# Patient Record
Sex: Male | Born: 1955 | Hispanic: No | Marital: Single | Smoking: Never smoker
Health system: Southern US, Community
[De-identification: ages and names within clinical notes are randomized; demographics above are authoritative.]

## PROBLEM LIST (undated history)

## (undated) DIAGNOSIS — A4151 Sepsis due to Escherichia coli [E. coli]: Secondary | ICD-10-CM

## (undated) DIAGNOSIS — M4807 Spinal stenosis, lumbosacral region: Secondary | ICD-10-CM

## (undated) DIAGNOSIS — E669 Obesity, unspecified: Secondary | ICD-10-CM

## (undated) DIAGNOSIS — I519 Heart disease, unspecified: Secondary | ICD-10-CM

## (undated) DIAGNOSIS — K4090 Unilateral inguinal hernia, without obstruction or gangrene, not specified as recurrent: Secondary | ICD-10-CM

## (undated) DIAGNOSIS — R1031 Right lower quadrant pain: Secondary | ICD-10-CM

## (undated) DIAGNOSIS — E119 Type 2 diabetes mellitus without complications: Secondary | ICD-10-CM

## (undated) DIAGNOSIS — N1330 Unspecified hydronephrosis: Secondary | ICD-10-CM

## (undated) DIAGNOSIS — Z87442 Personal history of urinary calculi: Secondary | ICD-10-CM

## (undated) DIAGNOSIS — I10 Essential (primary) hypertension: Secondary | ICD-10-CM

## (undated) DIAGNOSIS — Z8546 Personal history of malignant neoplasm of prostate: Secondary | ICD-10-CM

## (undated) DIAGNOSIS — N529 Male erectile dysfunction, unspecified: Secondary | ICD-10-CM

## (undated) DIAGNOSIS — I517 Cardiomegaly: Secondary | ICD-10-CM

## (undated) DIAGNOSIS — N200 Calculus of kidney: Secondary | ICD-10-CM

## (undated) HISTORY — DX: Male erectile dysfunction, unspecified: N52.9

## (undated) HISTORY — DX: Calculus of kidney: N20.0

## (undated) HISTORY — PX: TRANSTHORACIC ECHOCARDIOGRAM: SHX275

## (undated) HISTORY — PX: OTHER SURGICAL HISTORY: SHX169

## (undated) HISTORY — DX: Essential (primary) hypertension: I10

---

## 2004-12-16 ENCOUNTER — Emergency Department (HOSPITAL_COMMUNITY): Admission: EM | Admit: 2004-12-16 | Discharge: 2004-12-16 | Payer: Self-pay | Admitting: Emergency Medicine

## 2010-11-08 ENCOUNTER — Other Ambulatory Visit (HOSPITAL_COMMUNITY): Payer: Self-pay | Admitting: Orthopedic Surgery

## 2010-11-08 ENCOUNTER — Ambulatory Visit (HOSPITAL_COMMUNITY)
Admission: RE | Admit: 2010-11-08 | Discharge: 2010-11-08 | Disposition: A | Discharge status: Home / Self Care | Payer: Worker's Compensation | Source: Ambulatory Visit | Attending: Orthopedic Surgery | Admitting: Orthopedic Surgery

## 2010-11-08 DIAGNOSIS — Z0389 Encounter for observation for other suspected diseases and conditions ruled out: Secondary | ICD-10-CM | POA: Insufficient documentation

## 2010-11-08 DIAGNOSIS — Z1389 Encounter for screening for other disorder: Secondary | ICD-10-CM

## 2011-03-29 ENCOUNTER — Inpatient Hospital Stay (HOSPITAL_COMMUNITY)
Admission: EM | Admit: 2011-03-29 | Discharge: 2011-03-31 | DRG: 552 | Disposition: A | Discharge status: Home / Self Care | Payer: Worker's Compensation | Attending: Orthopedic Surgery | Admitting: Orthopedic Surgery

## 2011-03-29 ENCOUNTER — Emergency Department (HOSPITAL_COMMUNITY): Payer: Worker's Compensation

## 2011-03-29 DIAGNOSIS — E669 Obesity, unspecified: Secondary | ICD-10-CM | POA: Diagnosis present

## 2011-03-29 DIAGNOSIS — I10 Essential (primary) hypertension: Secondary | ICD-10-CM | POA: Diagnosis present

## 2011-03-29 DIAGNOSIS — M5126 Other intervertebral disc displacement, lumbar region: Principal | ICD-10-CM | POA: Diagnosis present

## 2011-03-29 LAB — BASIC METABOLIC PANEL
Calcium: 10.3 mg/dL (ref 8.4–10.5)
Chloride: 105 mEq/L (ref 96–112)
GFR calc Af Amer: >60 mL/min (ref >60)
GFR calc non Af Amer: >60 mL/min (ref >60)

## 2011-03-29 LAB — APTT: aPTT: 30 seconds (ref 24–37)

## 2011-03-29 LAB — DIFFERENTIAL
Basophils Relative: 1 % (ref 0–1)
Lymphocytes Relative: 39 % (ref 12–46)
Lymphs Abs: 2.4 10*3/uL (ref 0.7–4.0)
Monocytes Absolute: 0.7 10*3/uL (ref 0.1–1.0)
Monocytes Relative: 11 % (ref 3–12)
Neutro Abs: 2.9 10*3/uL (ref 1.7–7.7)
Neutrophils Relative %: 49 % (ref 43–77)

## 2011-03-29 LAB — CBC
HCT: 40.9 % (ref 39.0–52.0)
Hemoglobin: 14.4 g/dL (ref 13.0–17.0)
MCH: 30.9 pg (ref 26.0–34.0)
MCHC: 35.2 g/dL (ref 30.0–36.0)
MCV: 87.8 fL (ref 78.0–100.0)
Platelets: 252 10*3/uL (ref 150–400)
RBC: 4.66 MIL/uL (ref 4.22–5.81)
RDW: 13.6 % (ref 11.5–15.5)

## 2011-03-29 LAB — URINALYSIS, ROUTINE W REFLEX MICROSCOPIC
Glucose, UA: 100 mg/dL — AB
Ketones, ur: NEGATIVE mg/dL
Nitrite: NEGATIVE
Protein, ur: NEGATIVE mg/dL
pH: 6 (ref 5.0–8.0)

## 2011-03-29 LAB — URINE MICROSCOPIC-ADD ON

## 2011-03-29 LAB — OCCULT BLOOD, POC DEVICE: Fecal Occult Bld: POSITIVE

## 2011-03-30 ENCOUNTER — Inpatient Hospital Stay (HOSPITAL_COMMUNITY): Payer: Worker's Compensation

## 2011-03-30 DIAGNOSIS — M4807 Spinal stenosis, lumbosacral region: Secondary | ICD-10-CM

## 2011-03-30 HISTORY — DX: Spinal stenosis, lumbosacral region: M48.07

## 2011-03-30 LAB — RAPID URINE DRUG SCREEN, HOSP PERFORMED
Amphetamines: NOT DETECTED
Benzodiazepines: NOT DETECTED
Tetrahydrocannabinol: NOT DETECTED

## 2011-03-30 MED ORDER — GADOBENATE DIMEGLUMINE 529 MG/ML IV SOLN
20.0000 mL | Freq: Once | INTRAVENOUS | Status: AC
  Administered 2011-03-30: 20 mL via INTRAVENOUS
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
## 2013-09-15 ENCOUNTER — Encounter: Payer: Self-pay | Admitting: Cardiology

## 2013-09-15 ENCOUNTER — Ambulatory Visit (INDEPENDENT_AMBULATORY_CARE_PROVIDER_SITE_OTHER): Payer: BC Managed Care – PPO | Admitting: Cardiology

## 2013-09-15 VITALS — BP 142/110 | HR 74 | Ht 71.0 in | Wt 241.6 lb

## 2013-09-15 DIAGNOSIS — R9431 Abnormal electrocardiogram [ECG] [EKG]: Secondary | ICD-10-CM

## 2013-09-15 DIAGNOSIS — E782 Mixed hyperlipidemia: Secondary | ICD-10-CM

## 2013-09-15 DIAGNOSIS — I10 Essential (primary) hypertension: Secondary | ICD-10-CM

## 2013-09-15 DIAGNOSIS — E669 Obesity, unspecified: Secondary | ICD-10-CM

## 2013-09-15 DIAGNOSIS — Z79899 Other long term (current) drug therapy: Secondary | ICD-10-CM

## 2013-09-15 DIAGNOSIS — I517 Cardiomegaly: Secondary | ICD-10-CM

## 2013-09-15 MED ORDER — CHLORTHALIDONE 25 MG PO TABS: 25.0000 mg | ORAL_TABLET | Freq: Every day | ORAL | Status: DC

## 2013-09-15 NOTE — Patient Instructions (Addendum)
START Chlorthalidone 25mg  daily.  Dr. Ellyn Hack recommends that you return for lab work in: at your [place] after midnight.  Dr. Ellyn Hack has requested that you have an echocardiogram. Echocardiography is a painless test that uses sound waves to create images of your heart. It provides your doctor with information about the size and shape of your heart and how well your heart's chambers and valves are working. This procedure takes approximately one hour. There are no restrictions for this procedure.   Your physician recommends that you schedule a follow-up appointment in: 3 months.

## 2013-09-17 ENCOUNTER — Encounter: Payer: Self-pay | Admitting: Cardiology

## 2013-09-17 DIAGNOSIS — I517 Cardiomegaly: Secondary | ICD-10-CM | POA: Insufficient documentation

## 2013-09-17 DIAGNOSIS — I10 Essential (primary) hypertension: Secondary | ICD-10-CM | POA: Insufficient documentation

## 2013-09-17 DIAGNOSIS — R9431 Abnormal electrocardiogram [ECG] [EKG]: Secondary | ICD-10-CM | POA: Insufficient documentation

## 2013-09-17 DIAGNOSIS — E669 Obesity, unspecified: Secondary | ICD-10-CM | POA: Insufficient documentation

## 2013-09-17 NOTE — Assessment & Plan Note (Deleted)
Check 2-D echocardiogram to assess for LVH

## 2013-09-17 NOTE — Assessment & Plan Note (Signed)
Discussed dietary modifications. I explained him that weight loss will actually help blood pressure control as well as cardiovascular risk.

## 2013-09-17 NOTE — Assessment & Plan Note (Signed)
Check 2-D echocardiogram to confirm or deny.

## 2013-09-17 NOTE — Progress Notes (Signed)
PATIENT: Derrick Walker MRN: [id number] DOB: Feb 09, 1956 PCP: No PCP Per Patient  Clinic Note: Chief Complaint  Patient presents with  . New Evaluation    Self referred:  has noticed BP has been fluctuating since 2012 up to 170/120.  No chest pain, SOB, edema.  Occas. dizziness when working out.  Dr. Janice Norrie recently prescribed Avodart but he decided not to take due to side effects he read about.   HPI: Derrick Walker is a 58 y.o. male with a PMH below who presents today for initial evaluation for hypertension and cardiovascular risk.  Interval History: He is an otherwise healthy middle-aged gentleman with no real cardiovascular risk besides being a man of 12 was mildly obese. He has had hypertension in the past, and it was trying to use lifestyle modification for treatment. He maintains that he is very active gentleman likes to exercise 5 days a week for about 30 minutes at a time. 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 From cardiovascular standpoint he is extremely stable.  He denies chest pain or shortness of breath with rest or exertion. No PND, orthopnea or edema. No palpitations, lightheadedness, dizziness, weakness or syncope/near syncope. No TIA/amaurosis fugax symptoms.  No melena, hematochezia hematuria.  Past Medical History  Diagnosis Date  . Hypertension, essential   . Mild obesity     Prior Cardiac Evaluation and Past Surgical History: Past Surgical History  Procedure Laterality Date  . Irrigation and debridement sebaceous cyst  08/2013    left shoulder    Allergies  Allergen Reactions  . Shellfish Allergy Hives    Current Outpatient Prescriptions  Medication Sig Dispense Refill  . Calcium Carbonate-Vitamin D (CALCIUM-VITAMIN D) 500-200 MG-UNIT per tablet Take 1 tablet by mouth daily.      . Thiamine HCl (VITAMIN B-1) 250 MG tablet Take 250 mg by mouth daily.      . vitamin C (ASCORBIC ACID) 500 MG tablet Take 500 mg by mouth daily.       No  current facility-administered medications for this visit.    History   Social History Narrative   He is a divorced father of 3. 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 He has finished his Masters is now working on his PhD. He never smoked. He drinks about 3 glasses of wine with food a week. He uses to help digest food.   He is Jewish and into [employer] does not eat any pork/ham and only a small amount of beef.   Family History family history includes Hypertension in his brother, maternal grandmother, mother, and paternal grandmother. both grandmothers also had hypertension. His parents are both living mother 35 and father is 81.  ROS: A comprehensive Review of Systems - Was negative.  PHYSICAL EXAM BP 142/110  Pulse 74  Ht 5\' 11"  (1.803 m)  Wt 241 lb 9.6 oz (109.589 kg)  BMI 33.71 kg/m2 General appearance: alert, cooperative, appears stated age, no distress, mildly obese and Well-nourished and well-groomed. Answers questions appropriately. Neck: no adenopathy, no carotid bruit, no JVD, supple, symmetrical, trachea midline and thyroid not enlarged, symmetric, no tenderness/mass/nodules Lungs: clear to auscultation bilaterally, normal percussion bilaterally and Nonlabored, good movement Heart: regular rate and rhythm, S1, S2 normal, no murmur, click, rub or gallop and normal apical impulse Abdomen: soft, non-tender; bowel sounds normal; no masses,  no organomegaly Extremities: extremities normal, atraumatic, no cyanosis or edema Pulses:  2+ and symmetric Neurologic: Alert and oriented X 3, normal strength and tone. Normal symmetric reflexes. Normal coordination and gait  PFX:[id number] today: Yes Rate: 74 , Rhythm: NSR. Diffuse T wave inversions so LVH with possible repolarization abnormalities  Recent Labs: None  ASSESSMENT / PLAN: Otherwise healthy gentleman with hypertension in ECG changes that  would suggest possible LVH here to establish baseline cardiovascular risk. Depending what the echocardiogram shows, we'll consider CPET test if possible for baseline cardiovascular risk assessment.  Hypertension, essential His blood pressure is elevated, and with there being a possibility of hypertension related cardiovascular disease with LVH on EKG.,   Plan:  I will start him on chlorthalidone 25 mg daily.  Check baseline chemistries; We'll also check his lipids as part of his baseline cardiovascular risk assessment.  2-D echocardiogram to confirm or deny actual LVH   Mild obesity Discussed dietary modifications. I explained him that weight loss will actually help blood pressure control as well as cardiovascular risk.  LVH (left ventricular hypertrophy) - an EKG Check 2-D echocardiogram to confirm or deny.    Orders Placed This Encounter  Procedures  . Lipid panel  . Comprehensive metabolic panel  . EKG 12-Lead  . 2D Echocardiogram without contrast    Standing Status: Future     Number of Occurrences:      Standing Expiration Date: 09/15/2014    Order Specific Question:  Type of Echo    Answer:  Complete    Order Specific Question:  Where should this test be performed    Answer:  [facility]    Order Specific Question:  Reason for exam-Echo    Answer:  Aortic Valve Disorder 424.1   Meds ordered this encounter  Medications  . vitamin C (ASCORBIC ACID) 500 MG tablet    Sig: Take 500 mg by mouth daily.  . Thiamine HCl (VITAMIN B-1) 250 MG tablet    Sig: Take 250 mg by mouth daily.  . Calcium Carbonate-Vitamin D (CALCIUM-VITAMIN D) 500-200 MG-UNIT per tablet    Sig: Take 1 tablet by mouth daily.  . chlorthalidone (HYGROTON) 25 MG tablet    Sig: Take 1 tablet (25 mg total) by mouth daily.    Dispense:  30 tablet    Refill:  6    Followup: One month   DAVID W. Ellyn Hack, M.D., M.S. Interventional Cardiolgy [facility]

## 2013-09-17 NOTE — Assessment & Plan Note (Signed)
His blood pressure is elevated, and with there being a possibility of hypertension related cardiovascular disease with LVH on EKG.,   Plan:  I will start him on chlorthalidone 25 mg daily.  Check baseline chemistries; We'll also check his lipids as part of his baseline cardiovascular risk assessment.  2-D echocardiogram to confirm or deny actual LVH

## 2013-09-24 ENCOUNTER — Ambulatory Visit (HOSPITAL_COMMUNITY): Payer: Self-pay

## 2013-10-14 ENCOUNTER — Ambulatory Visit (HOSPITAL_COMMUNITY)
Admission: RE | Admit: 2013-10-14 | Discharge: 2013-10-14 | Disposition: A | Discharge status: Home / Self Care | Payer: BC Managed Care – PPO | Source: Ambulatory Visit | Attending: Cardiovascular Disease | Admitting: Cardiovascular Disease

## 2013-10-14 ENCOUNTER — Telehealth: Payer: Self-pay

## 2013-10-14 DIAGNOSIS — I517 Cardiomegaly: Secondary | ICD-10-CM | POA: Insufficient documentation

## 2013-10-14 DIAGNOSIS — I5189 Other ill-defined heart diseases: Secondary | ICD-10-CM

## 2013-10-14 HISTORY — DX: Other ill-defined heart diseases: I51.89

## 2013-10-14 HISTORY — PX: TRANSTHORACIC ECHOCARDIOGRAM: SHX275

## 2013-10-14 HISTORY — DX: Cardiomegaly: I51.7

## 2013-10-14 MED ORDER — CHLORTHALIDONE 25 MG PO TABS: 25.0000 mg | ORAL_TABLET | Freq: Every day | ORAL | Status: DC

## 2013-10-14 NOTE — Telephone Encounter (Signed)
Patient had an appointment for Echo procedure.  Request refills of Chlorthalidone to be sent to [facility] on [address].  Rx was sent to pharmacy electronically. Patient notified.

## 2013-10-14 NOTE — Progress Notes (Signed)
2D Echo Performed 10/14/2013    Leveon Pelzer, RCS  

## 2013-11-03 ENCOUNTER — Telehealth: Payer: Self-pay | Admitting: *Deleted

## 2013-11-03 NOTE — Telephone Encounter (Signed)
Pretty normal Echo. No significant Aortic valve disease. Normal EF. DH ----- Message ----- From: Hulan Fray Sent: 10/30/2013 9:07 AM To: Leonie Man, MD   Spoke to patient. Result given . Verbalized understanding

## 2013-11-23 ENCOUNTER — Other Ambulatory Visit: Payer: Self-pay | Admitting: Urology

## 2013-11-30 ENCOUNTER — Ambulatory Visit: Payer: Self-pay | Admitting: Cardiology

## 2013-12-04 ENCOUNTER — Encounter (HOSPITAL_BASED_OUTPATIENT_CLINIC_OR_DEPARTMENT_OTHER): Payer: Self-pay | Admitting: *Deleted

## 2013-12-08 ENCOUNTER — Encounter (HOSPITAL_BASED_OUTPATIENT_CLINIC_OR_DEPARTMENT_OTHER): Payer: Self-pay | Admitting: *Deleted

## 2013-12-09 ENCOUNTER — Encounter (HOSPITAL_BASED_OUTPATIENT_CLINIC_OR_DEPARTMENT_OTHER): Payer: Self-pay | Admitting: *Deleted

## 2013-12-09 NOTE — Progress Notes (Signed)
NPO AFTER MN. ARRIVE AT 0630.  NEEDS ISTAT 8. CURRENT EKG IN CHART AND EPIC. WILL DO FLEET ENEMA AM DOS.

## 2013-12-11 ENCOUNTER — Ambulatory Visit (HOSPITAL_BASED_OUTPATIENT_CLINIC_OR_DEPARTMENT_OTHER): Payer: BC Managed Care – PPO | Admitting: Anesthesiology

## 2013-12-11 ENCOUNTER — Encounter (HOSPITAL_BASED_OUTPATIENT_CLINIC_OR_DEPARTMENT_OTHER): Payer: BC Managed Care – PPO | Admitting: Anesthesiology

## 2013-12-11 ENCOUNTER — Encounter (HOSPITAL_BASED_OUTPATIENT_CLINIC_OR_DEPARTMENT_OTHER)
Admission: RE | Disposition: A | Discharge status: Home / Self Care | Payer: Self-pay | Source: Ambulatory Visit | Attending: Urology

## 2013-12-11 ENCOUNTER — Ambulatory Visit (HOSPITAL_BASED_OUTPATIENT_CLINIC_OR_DEPARTMENT_OTHER)
Admission: RE | Admit: 2013-12-11 | Discharge: 2013-12-11 | Disposition: A | Discharge status: Home / Self Care | Payer: BC Managed Care – PPO | Source: Ambulatory Visit | Attending: Urology | Admitting: Urology

## 2013-12-11 ENCOUNTER — Encounter (HOSPITAL_BASED_OUTPATIENT_CLINIC_OR_DEPARTMENT_OTHER): Payer: Self-pay

## 2013-12-11 DIAGNOSIS — I10 Essential (primary) hypertension: Secondary | ICD-10-CM | POA: Insufficient documentation

## 2013-12-11 DIAGNOSIS — C61 Malignant neoplasm of prostate: Secondary | ICD-10-CM | POA: Insufficient documentation

## 2013-12-11 DIAGNOSIS — N139 Obstructive and reflux uropathy, unspecified: Secondary | ICD-10-CM | POA: Insufficient documentation

## 2013-12-11 DIAGNOSIS — N529 Male erectile dysfunction, unspecified: Secondary | ICD-10-CM | POA: Insufficient documentation

## 2013-12-11 DIAGNOSIS — Z79899 Other long term (current) drug therapy: Secondary | ICD-10-CM | POA: Insufficient documentation

## 2013-12-11 HISTORY — PX: PROSTATE BIOPSY: SHX241

## 2013-12-11 LAB — POCT I-STAT, CHEM 8
BUN: 13 mg/dL (ref 6–23)
CALCIUM ION: 1.47 mmol/L — AB (ref 1.12–1.23)
Chloride: 102 mEq/L (ref 96–112)
Creatinine, Ser: 1 mg/dL (ref 0.50–1.35)
GLUCOSE: 123 mg/dL — AB (ref 70–99)
HCT: 45 % (ref 39.0–52.0)
HEMOGLOBIN: 15.3 g/dL (ref 13.0–17.0)
POTASSIUM: 3.6 meq/L — AB (ref 3.7–5.3)
Sodium: 145 mEq/L (ref 137–147)
TCO2: 23 mmol/L (ref 0–100)

## 2013-12-11 SURGERY — BIOPSY, PROSTATE, RECTAL APPROACH, WITH US GUIDANCE
Anesthesia: Monitor Anesthesia Care | Site: Prostate

## 2013-12-11 MED ORDER — PROPOFOL 10 MG/ML IV BOLUS
INTRAVENOUS | Status: DC | PRN
  Administered 2013-12-11: 50 mg via INTRAVENOUS

## 2013-12-11 MED ORDER — LACTATED RINGERS IV SOLN
INTRAVENOUS | Status: DC
  Administered 2013-12-11 (×2): via INTRAVENOUS
  Filled 2013-12-11: qty 1000

## 2013-12-11 MED ORDER — PROMETHAZINE HCL 25 MG/ML IJ SOLN
6.2500 mg | INTRAMUSCULAR | Status: DC | PRN
  Filled 2013-12-11: qty 1

## 2013-12-11 MED ORDER — PROPOFOL 10 MG/ML IV EMUL
INTRAVENOUS | Status: DC | PRN
  Administered 2013-12-11: 50 ug/kg/min via INTRAVENOUS

## 2013-12-11 MED ORDER — FENTANYL CITRATE 0.05 MG/ML IJ SOLN
INTRAMUSCULAR | Status: AC
  Filled 2013-12-11: qty 2

## 2013-12-11 MED ORDER — GENTAMICIN IN SALINE 1.6-0.9 MG/ML-% IV SOLN
80.0000 mg | INTRAVENOUS | Status: DC
  Filled 2013-12-11: qty 50

## 2013-12-11 MED ORDER — ONDANSETRON HCL 4 MG/2ML IJ SOLN
INTRAMUSCULAR | Status: DC | PRN
Start: 2013-12-11 — End: 2013-12-11
  Administered 2013-12-11: 4 mg via INTRAVENOUS

## 2013-12-11 MED ORDER — FENTANYL CITRATE 0.05 MG/ML IJ SOLN
25.0000 ug | INTRAMUSCULAR | Status: DC | PRN
  Filled 2013-12-11: qty 1

## 2013-12-11 MED ORDER — CIPROFLOXACIN IN D5W 400 MG/200ML IV SOLN
400.0000 mg | INTRAVENOUS | Status: AC
  Administered 2013-12-11: 400 mg via INTRAVENOUS
  Filled 2013-12-11: qty 200

## 2013-12-11 MED ORDER — LACTATED RINGERS IV SOLN
INTRAVENOUS | Status: DC
  Filled 2013-12-11: qty 1000

## 2013-12-11 MED ORDER — DEXAMETHASONE SODIUM PHOSPHATE 4 MG/ML IJ SOLN
INTRAMUSCULAR | Status: DC | PRN
  Administered 2013-12-11: 10 mg via INTRAVENOUS

## 2013-12-11 MED ORDER — ACETAMINOPHEN 10 MG/ML IV SOLN
INTRAVENOUS | Status: DC | PRN
  Administered 2013-12-11: 1000 mg via INTRAVENOUS

## 2013-12-11 MED ORDER — MIDAZOLAM HCL 2 MG/2ML IJ SOLN
INTRAMUSCULAR | Status: AC
  Filled 2013-12-11: qty 4

## 2013-12-11 MED ORDER — GENTAMICIN SULFATE 40 MG/ML IJ SOLN
440.0000 mg | Freq: Once | INTRAVENOUS | Status: AC
  Administered 2013-12-11: 440 mg via INTRAVENOUS
  Filled 2013-12-11: qty 11

## 2013-12-11 MED ORDER — LIDOCAINE HCL (CARDIAC) 20 MG/ML IV SOLN
INTRAVENOUS | Status: DC | PRN
  Administered 2013-12-11: 50 mg via INTRAVENOUS

## 2013-12-11 SURGICAL SUPPLY — 18 items
BAG URINE LEG 19OZ MD ST LTX (BAG) IMPLANT
CATH FOLEY 2WAY SLVR  5CC 16FR (CATHETERS)
CATH FOLEY 2WAY SLVR 5CC 16FR (CATHETERS) IMPLANT
DRESSING TELFA 8X3 (GAUZE/BANDAGES/DRESSINGS) ×2 IMPLANT
DRSG TEGADERM 4X4.75 (GAUZE/BANDAGES/DRESSINGS) IMPLANT
DRSG TEGADERM 8X12 (GAUZE/BANDAGES/DRESSINGS) IMPLANT
GAUZE SPONGE 4X4 12PLY STRL LF (GAUZE/BANDAGES/DRESSINGS) IMPLANT
GLOVE BIO SURGEON STRL SZ7 (GLOVE) ×2 IMPLANT
NDL SAFETY ECLIPSE 18X1.5 (NEEDLE) IMPLANT
NEEDLE HYPO 18GX1.5 SHARP (NEEDLE)
NEEDLE SPNL 22GX7 QUINCKE BK (NEEDLE) IMPLANT
PLUG CATH AND CAP STER (CATHETERS) IMPLANT
SET IRRIG Y TYPE TUR BLADDER L (SET/KITS/TRAYS/PACK) IMPLANT
SURGILUBE 2OZ TUBE FLIPTOP (MISCELLANEOUS) ×2 IMPLANT
SYR 20CC LL (SYRINGE) IMPLANT
SYRINGE 10CC LL (SYRINGE) IMPLANT
TOWEL OR 17X24 6PK STRL BLUE (TOWEL DISPOSABLE) IMPLANT
UNDERPAD 30X30 INCONTINENT (UNDERPADS AND DIAPERS) ×2 IMPLANT

## 2013-12-11 NOTE — H&P (Signed)
History of Present Illness Derrick Walker presents today with 2 years history of increasing frequency, straining on urination, sensation of incomplete bladder emptying. He has nocturia x 2-3. He had same symptoms in 2005 when I last saw him. His AUA symptom score is 32-33. His PSA at that time was 4.93. He did not keep his follow-up appointment. His PSA in 1999 was also elevated at 6.0. He did not return then for follow-up either. I see him today after an almost ten years hiatus. He still complains of difficulty achieving erections. He has orgasm but does not ejaculate a normal amount of semen.  His father had prostate problems but from his description they do not appear to be related to prostate cancer.   Past Medical History Problems  1. History of hypertension (V12.59) 2. History of sleep apnea (V13.89)  Surgical History Problems  1. History of No Surgical Problems  Current Meds 1. Omega 3 CAPS;  Therapy: (Recorded:20Feb2015) to Recorded 2. Vitamin B Complex CAPS;  Therapy: (Recorded:20Feb2015) to Recorded 3. Vitamin C TABS;  Therapy: (Recorded:20Feb2015) to Recorded  Allergies Medication  1. No Known Drug Allergies Non-Medication  2. Shellfish  Family History Problems  1. Family history of prostate problems (V18.7) : Father 2. Family history of renal failure (V18.69) : Father  Social History Problems    Never smoker (V49.89)   Three children   1 son, 2 daughters  Review of Systems Genitourinary, constitutional, skin, eye, otolaryngeal, hematologic/lymphatic, cardiovascular, pulmonary, endocrine, musculoskeletal, gastrointestinal, neurological and psychiatric system(s) were reviewed and pertinent findings if present are noted.  Genitourinary: urinary frequency, feelings of urinary urgency, nocturia, difficulty starting the urinary stream, weak urinary stream, urinary stream starts and stops, incomplete emptying of bladder, erectile dysfunction and initiating urination  requires straining.  Integumentary: pruritus.  Musculoskeletal: back pain.    Vitals Vital Signs [Data Includes: Last 1 Day]  Recorded: 20Feb2015 01:14PM  Height: 5 ft 11 in Weight: 200 lb  BMI Calculated: 27.89 BSA Calculated: 2.11 Blood Pressure: 173 / 102 Temperature: 98.2 F Heart Rate: 87  Physical Exam Constitutional: Well nourished and well developed . No acute distress.  ENT:. The ears and nose are normal in appearance.  Neck: The appearance of the neck is normal and no neck mass is present.  Pulmonary: No respiratory distress and normal respiratory rhythm and effort.  Cardiovascular: Heart rate and rhythm are normal . No peripheral edema.  Abdomen: The abdomen is soft and nontender. No masses are palpated. No CVA tenderness. No hernias are palpable. No hepatosplenomegaly noted.  Rectal: Rectal exam demonstrates normal sphincter tone, no tenderness and no masses. Estimated prostate size is 3+. The prostate has no nodularity and is not tender. The left seminal vesicle is nonpalpable. The right seminal vesicle is nonpalpable. The perineum is normal on inspection.  Genitourinary: Examination of the penis demonstrates no discharge, no masses, no lesions and a normal meatus. The scrotum is without lesions. The right epididymis is palpably normal and non-tender. The left epididymis is palpably normal and non-tender. The right testis is non-tender and without masses. The left testis is non-tender and without masses.  Lymphatics: The femoral and inguinal nodes are not enlarged or tender.  Skin: Normal skin turgor, no visible rash and no visible skin lesions.  Neuro/Psych:. Mood and affect are appropriate.    Results/Data Urine [Data Includes: Last 1 Day]   20Feb2015 COLOR YELLOW  APPEARANCE CLEAR  SPECIFIC GRAVITY 1.020  pH 6.0  GLUCOSE 250 mg/dL BILIRUBIN NEG  KETONE TRACE  mg/dL BLOOD NEG  PROTEIN TRACE mg/dL UROBILINOGEN 0.2 mg/dL NITRITE NEG  LEUKOCYTE ESTERASE NEG    Assessment Assessed  1. Benign localized prostatic hyperplasia with lower urinary tract symptoms (LUTS)  (600.21,599.69) 2. Elevated prostate specific antigen (PSA) (790.93) 3. Erectile dysfunction (607.84)  Plan Elevated prostate specific antigen (PSA)  1. Start: Avodart 0.5 MG Oral Capsule; TAKE 1 CAPSULE EVERY DAY 2. PSA REFLEX TO FREE; Status:In Progress - Specimen/Data Collected;   Done:  [id number] 3. VENIPUNCTURE; Status:Complete;   Done: [id number] 4. Follow-up Month x 1 Office  Follow-up  Status: Hold For - Date of Service  Requested for:  26Mar2015 Erectile dysfunction  5. Start: Viagra 100 MG Oral Tablet; UAD - USE AS DIRECTED Health Maintenance  6. UA With REFLEX; [Do Not Release]; Status:Resulted - Requires Verification;   Done:  [id number] 12:45PM  Check PSA. Viagra 100 mgm PRN. Rapaflo 8 mgm, Avodart 0.5 mgm daily. Follow-up in one month. Will call patient with PSA results. If his voiding symptoms persist he will need cystoscopy. His PSA is now 13.45.  He needs prostate biopsy.  The procedure, risks, benefits were reviewed with the patient.  The risks include but are not limited to hemorrhage: blood in the stools, the urine or semen, infection, sepsis.  He understands to call the if he experiences chills, fever after the procedure.

## 2013-12-11 NOTE — Transfer of Care (Signed)
Immediate Anesthesia Transfer of Care Note  Patient: Derrick Walker  Procedure(s) Performed: Procedure(s): BIOPSY TRANSRECTAL ULTRASONIC PROSTATE (TUBP) (N/A)  Patient Location: PACU  Anesthesia Type:MAC  Level of Consciousness: awake and oriented  Airway & Oxygen Therapy: Patient Spontanous Breathing and Patient connected to face mask oxygen  Post-op Assessment: Report given to PACU RN  Post vital signs: Reviewed and stable  Complications: No apparent anesthesia complications

## 2013-12-11 NOTE — Anesthesia Preprocedure Evaluation (Addendum)
Anesthesia Evaluation  Patient identified by MRN, date of birth, ID band Patient awake    Reviewed: Allergy & Precautions, H&P , NPO status , Patient's Chart, lab work & pertinent test results  Airway       Dental   Pulmonary neg pulmonary ROS,          Cardiovascular hypertension, Pt. on medications     Neuro/Psych negative neurological ROS  negative psych ROS   GI/Hepatic negative GI ROS, Neg liver ROS,   Endo/Other  Morbid obesity  Renal/GU negative Renal ROS  negative genitourinary   Musculoskeletal negative musculoskeletal ROS (+)   Abdominal   Peds  Hematology negative hematology ROS (+)   Anesthesia Other Findings   Reproductive/Obstetrics                          Anesthesia Physical Anesthesia Plan  ASA: II  Anesthesia Plan: MAC   Post-op Pain Management:    Induction: Intravenous  Airway Management Planned: Simple Face Mask  Additional Equipment:   Intra-op Plan:   Post-operative Plan: Extubation in OR  Informed Consent: I have reviewed the patients History and Physical, chart, labs and discussed the procedure including the risks, benefits and alternatives for the proposed anesthesia with the patient or authorized representative who has indicated his/her understanding and acceptance.   Dental advisory given  Plan Discussed with: CRNA  Anesthesia Plan Comments:         Anesthesia Quick Evaluation

## 2013-12-11 NOTE — Op Note (Signed)
Derrick Walker is a 58 y.o.   12/11/2013  General  Pre-op diagnosis: Elevated PSA.  Postop diagnosis: Same  Procedure done ultrasound-guided prostate biopsy  Surgeon: Charlene Brooke. Lively Haberman  Technologist: [place]  Anesthesia: Monitored anesthesia care  Indication: Patient is a 58 years old male who has a history of elevated PSA. His PSA in 2005 was 4.93. He never returned for followup. He returned to the office on 09/11/2013 after a 10 years hiatus complaining of irritative voiding symptoms. His PSA is now 13.45. He is scheduled for prostate biopsy. He prefers to have it done under anesthesia.  Procedure: Patient was identified by his wrist band and proper timeout was taken.  Under monitor anesthesia care the patient was prepped and draped and placed in the left lateral decubitus position. The transducer was inserted in the rectum. The seminal vesicles appear normal. The prostate measures 6.45 cm in length, 6.32 cm in height, 7.14 cm in width. The prostate volume is 152.41 cc. There is no evidence of hypoechoic nodules. There is no evidence of calcifications. Then 2 biopsies of the the right lateral and mid base, 2 biopsies of the right lateral and mid gland and 2 biopsies of the right apex were done. The same procedure was done on the left side. There was no evidence of bleeding at the end of the procedure.   EBL: Minimal   The patient tolerated the procedure well and left the or in satisfactory condition to postanesthesia care unit.

## 2013-12-11 NOTE — Discharge Instructions (Signed)
°  Post Anesthesia Home Care Instructions  Activity: Get plenty of rest for the remainder of the day. A responsible adult should stay with you for 24 hours following the procedure.  For the next 24 hours, DO NOT: -Drive a car -Paediatric nurse -Drink alcoholic beverages -Take any medication unless instructed by your physician -Make any legal decisions or sign important papers.  Meals: Start with liquid foods such as gelatin or soup. Progress to regular foods as tolerated. Avoid greasy, spicy, heavy foods. If nausea and/or vomiting occur, drink only clear liquids until the nausea and/or vomiting subsides. Call your physician if vomiting continues.  Special Instructions/Symptoms: Your throat may feel dry or sore from the anesthesia or the breathing tube placed in your throat during surgery. If this causes discomfort, gargle with warm salt water. The discomfort should disappear within 24 hours. Prostate Biopsy TRUS Biopsy BEFORE THE TEST   Do not take aspirin. Do not take any medicine that has aspirin in it 7 days before your biopsy.  You may be given a medicine to take on the day of your biopsy.  You may also be given a medicine or treatment to help you go poop (laxative or enema). AFTER THE TEST  Only take medicine as told by your doctor.  It is normal to have some bleeding from your rectum for the first 5 days.  You may have blood in your pee (urine) or sperm. Finding out the results of your test Ask when your test results will be ready. Make sure you get your test results. GET HELP RIGHT AWAY IF:  You have a temperature by mouth above 102 F (38.9 C), not controlled by medicine.  You have blood in your pee for more than 5 days.  You have a lot of blood in your pee.  You have bleeding from your rectum for more than 5 days or have a lot of blood in your poop (feces).  You have severe pain. Document Released: 06/27/2009 Document Revised: 10/01/2011 Document Reviewed:  02/25/2013 [facility] Patient Information 2014 [place], [place].

## 2013-12-12 NOTE — Anesthesia Postprocedure Evaluation (Signed)
Anesthesia Post Note  Patient: Derrick Walker  Procedure(s) Performed: Procedure(s) (LRB): BIOPSY TRANSRECTAL ULTRASONIC PROSTATE (TUBP) (N/A)  Anesthesia type: MAC  Patient location: PACU  Post pain: Pain level controlled  Post assessment: Post-op Vital signs reviewed  Last Vitals:  Filed Vitals:   12/11/13 1016  BP: 146/78  Pulse: 63  Temp: 36.3 C  Resp: 16    Post vital signs: Reviewed  Level of consciousness: sedated  Complications: No apparent anesthesia complications

## 2013-12-15 ENCOUNTER — Encounter (HOSPITAL_BASED_OUTPATIENT_CLINIC_OR_DEPARTMENT_OTHER): Payer: Self-pay | Admitting: Urology

## 2014-01-05 ENCOUNTER — Ambulatory Visit: Payer: Self-pay | Admitting: Cardiology

## 2014-01-06 ENCOUNTER — Other Ambulatory Visit (HOSPITAL_COMMUNITY): Payer: Self-pay | Admitting: Urology

## 2014-01-06 DIAGNOSIS — C61 Malignant neoplasm of prostate: Secondary | ICD-10-CM

## 2014-01-14 ENCOUNTER — Ambulatory Visit (HOSPITAL_COMMUNITY): Payer: BC Managed Care – PPO

## 2014-01-14 ENCOUNTER — Encounter (HOSPITAL_COMMUNITY): Payer: Self-pay

## 2014-01-14 ENCOUNTER — Encounter (HOSPITAL_COMMUNITY)
Admission: RE | Admit: 2014-01-14 | Discharge: 2014-01-14 | Disposition: A | Discharge status: Home / Self Care | Payer: BC Managed Care – PPO | Source: Ambulatory Visit | Attending: Urology | Admitting: Urology

## 2014-01-14 DIAGNOSIS — C61 Malignant neoplasm of prostate: Secondary | ICD-10-CM | POA: Insufficient documentation

## 2014-01-14 MED ORDER — TECHNETIUM TC 99M MEDRONATE IV KIT
25.0000 | PACK | Freq: Once | INTRAVENOUS | Status: AC | PRN
Start: 2014-01-14 — End: 2014-01-14
  Administered 2014-01-14: 27 via INTRAVENOUS

## 2014-02-17 ENCOUNTER — Telehealth: Payer: Self-pay | Admitting: Cardiology

## 2014-02-17 ENCOUNTER — Encounter: Payer: Self-pay | Admitting: *Deleted

## 2014-02-17 DIAGNOSIS — R7309 Other abnormal glucose: Secondary | ICD-10-CM

## 2014-02-17 NOTE — Telephone Encounter (Signed)
Per Mr.Mccutchen when reviewing his numbers and and lab work it appears he is pre -diabetic and is wanting to know if Dr.Harding can refer him to someone or can he take care of the issue.Marland Kitchen Please call   Thanks

## 2014-02-17 NOTE — Telephone Encounter (Signed)
Spoke with pt, explained he would need more lab, HgbA1c. Pt does not have a primary care md, discuss with sharon, dr harding's nurse. Order placed for fasting labs and pt made aware of lab location. Explained to pt if lab work is abnormal he will need to find a primary care md. Patient voiced understanding

## 2014-02-25 ENCOUNTER — Ambulatory Visit: Payer: BC Managed Care – PPO | Admitting: Radiation Oncology

## 2014-02-25 ENCOUNTER — Ambulatory Visit: Payer: BC Managed Care – PPO

## 2014-03-12 ENCOUNTER — Encounter: Payer: Self-pay | Admitting: Radiation Oncology

## 2014-03-12 NOTE — Progress Notes (Signed)
GU Location of Tumor / Histology: prostate adenocarcinoma  If Prostate Cancer, Gleason Score is (3 + 3) and PSA is (13.45 on 09/11/13) 2005 PSA 4.93 1999 PSA 6.0  Derrick Walker presented 6 months ago with signs/symptoms of: 2 year history of increasing urinary frequency, straining with urination, sensation of incomplete emptying, nocturia x 2-3.  Biopsies of  prostate revealed:  12/11/13 Volume 152.41 cc Diagnosis 1. Prostate, needle biopsy(ies), right base lateral - BENIGN PROSTATE TISSUE. 2. Prostate, needle biopsy(ies), right base medial - BENIGN PROSTATE TISSUE. 3. Prostate, needle biopsy(ies), right mid lateral - BENIGN PROSTATE TISSUE. 4. Prostate, needle biopsy(ies), right mid medial - PROSTATIC ADENOCARCINOMA, GLEASON'S SCORE 3+3=6, INVOLVING LESS THAN 5% OF ONE OF TWO CORES. 5. Prostate, needle biopsy(ies), right apex X2 - PROSTATIC ADENOCARCINOMA, GLEASON'S SCORE 3+3=6, INVOLVING LESS THAN 5% OF ONE OF FOUR CORES. 6. Prostate, needle biopsy(ies), left base lateral - PROSTATIC ADENOCARCINOMA, GLEASON'S SCORE 3+3=6, INVOLVING 5% OF ONE CORE. 7. Prostate, needle biopsy(ies), left base medial - PROSTATIC ADENOCARCINOMA, GLEASON 'S SCORE 3+3=6, INVOLVING LESS THAN 5% OF ONE OF TWO CORES. 8. Prostate, needle biopsy(ies), left mid lateral - BENIGN PROSTATE TISSUE. 9. Prostate, needle biopsy(ies), left mid medial - BENIGN PROSTATE TISSUE. 10. Prostate, needle biopsy(ies), left apex lateral x2 - BENIGN PROSTATE TISSUE.  Past/Anticipated interventions by urology, if any: biopsy, CT and Bone scan which were both negative  Past/Anticipated interventions by medical oncology, if any: none  Weight changes, if any:   Bowel/Bladder complaints, if any:   Nausea/Vomiting, if any:   Pain issues, if any:    SAFETY ISSUES:  Prior radiation? no  Pacemaker/ICD? no  Possible current pregnancy? na  Is the patient on methotrexate? no  Current Complaints / other details:  1  son, 2 daughters Per 02/05/14 Dr Sammie Bench note, pt interested in radical robotic prostatectomy.

## 2014-03-16 ENCOUNTER — Other Ambulatory Visit: Payer: Self-pay | Admitting: Urology

## 2014-03-16 ENCOUNTER — Telehealth: Payer: Self-pay | Admitting: *Deleted

## 2014-03-16 ENCOUNTER — Ambulatory Visit
Admission: RE | Admit: 2014-03-16 | Discharge: 2014-03-16 | Disposition: A | Discharge status: Home / Self Care | Payer: BC Managed Care – PPO | Source: Ambulatory Visit | Attending: Radiation Oncology | Admitting: Radiation Oncology

## 2014-03-16 ENCOUNTER — Ambulatory Visit: Payer: BC Managed Care – PPO

## 2014-03-16 ENCOUNTER — Ambulatory Visit (INDEPENDENT_AMBULATORY_CARE_PROVIDER_SITE_OTHER): Payer: BC Managed Care – PPO | Admitting: Cardiology

## 2014-03-16 ENCOUNTER — Encounter: Payer: Self-pay | Admitting: Cardiology

## 2014-03-16 VITALS — BP 152/100 | HR 83 | Ht 71.0 in | Wt 246.0 lb

## 2014-03-16 DIAGNOSIS — E669 Obesity, unspecified: Secondary | ICD-10-CM

## 2014-03-16 DIAGNOSIS — R9431 Abnormal electrocardiogram [ECG] [EKG]: Secondary | ICD-10-CM

## 2014-03-16 DIAGNOSIS — I10 Essential (primary) hypertension: Secondary | ICD-10-CM

## 2014-03-16 MED ORDER — LOSARTAN POTASSIUM 25 MG PO TABS: 25.0000 mg | ORAL_TABLET | Freq: Every day | ORAL | Status: DC

## 2014-03-16 NOTE — Telephone Encounter (Signed)
Phoned patient to try

## 2014-03-16 NOTE — Patient Instructions (Addendum)
Your physician wants you to follow-up in 6 month Dr Ellyn Hack. You will receive a reminder letter in the mail two months in advance. If you don't receive a letter, please call our office to schedule the follow-up appointment.   START LOSARTAN 25 MG 1 TABLET DAILY.(PATIENT AWARE )  NO PRE OP TESTING NEEDED

## 2014-03-17 NOTE — Telephone Encounter (Signed)
error 

## 2014-03-18 ENCOUNTER — Encounter: Payer: Self-pay | Admitting: Radiation Oncology

## 2014-03-19 ENCOUNTER — Encounter: Payer: Self-pay | Admitting: Cardiology

## 2014-03-19 NOTE — Assessment & Plan Note (Signed)
He is aware of the weight is gained. He is deathly active and exercise were recommended if 5 days week. He is however yet to adopt a good healthy diet. Discussed the importance of the the combination of diet plus exercise. Discussed various healthy diet options.

## 2014-03-19 NOTE — Telephone Encounter (Signed)
Dr Valere Dross,  I faxed a copy of this message to Rebekah Chesterfield, RN at [facility]. She will pass on to Dr Tresa Moore. Who saw this patient.  Stanton Kidney

## 2014-03-19 NOTE — Assessment & Plan Note (Signed)
Pressure doesn't seem to be as well controlled as an alert expected. He initially had a good effect from the chlorthalidone, however he probably needs some additional boost. Plan: Add losartan 25 mg

## 2014-03-19 NOTE — Progress Notes (Signed)
PCP: No PCP Per Patient  Clinic Note: Chief Complaint  Patient presents with  . Follow-up    pt denies any chest pains or sob any swelling in lany body parts or dizzy spells pt feels good.     HPI: Derrick Walker is a 58 y.o. male with a Cardiovascular Problem List below who presents today for ~6 month follow up. 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 He is a lot of salted get out of shape and become less healthy as he is currently in graduate school and tried her on business. 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 He is gaining a lot of weight since he got exercising. He finally now is coming back into a routine of exercise.  He was originally seen in February 2015 as a self referral for Hypertension.  He does not have a PCP.  He was started on Chlorthalidone. He had an echocardiogram performed as noted below.  Interval History: Since his last visit, we've been diagnosed with prostate cancer and is now undergoing therapy for that. The plan is to have a robotic prostatectomy in the near future. He continues to be relatively asymptomatic with no significant symptoms of chest tightness or pressure at rest or exertion. No PND, orthopnea or dizziness. No palpitations, lightheadedness, dizziness, weakness or syncope/near syncope. No TIA/amaurosis fugax symptoms. No melena, hematochezia, hematuria, or epstaxis. No claudication.  Past Medical History  Diagnosis Date  . Hypertension, essential   . Elevated PSA   . Nocturia   . Prostate cancer 12/11/13    Gleason 6, volume 152.41 cc  . Sleep apnea     Prior Cardiac Evaluation and Past Surgical History: Past Surgical History  Procedure Laterality Date  . Transthoracic echocardiogram  10-14-2013   dr Shanon Brow harding    mild LVH/  ef  58-85%/  grade I diastolic dysfunction/  mild LAE  . Prostate biopsy N/A 12/11/2013    Procedure: BIOPSY TRANSRECTAL ULTRASONIC PROSTATE (TUBP);   Surgeon: Arvil Persons, MD;  Location: [facility];  Service: Urology;  Laterality: N/A;   MEDICATIONS AND ALLERGIES REVIEWED IN EPIC No Change in Social and Family History  ROS: A comprehensive Review of Systems - essentially negative with no major problems besides mild arthralgias.  Wt Readings from Last 3 Encounters:  03/16/14 246 lb (111.585 kg)  12/11/13 241 lb (109.317 kg)  12/11/13 241 lb (109.317 kg)    PHYSICAL EXAM BP 152/100  Pulse 83  Ht 5\' 11"  (1.803 m)  Wt 246 lb (111.585 kg)  BMI 34.33 kg/m2 General appearance: alert, cooperative, appears stated age, no distress, mildly obese and Well-nourished and well-groomed. Answers questions appropriately.  Neck: no adenopathy, no carotid bruit, no JVD, supple, symmetrical, trachea midline and thyroid not enlarged, symmetric, no tenderness/mass/nodules  Lungs: clear to auscultation bilaterally, normal percussion bilaterally and Nonlabored, good movement  Heart: regular rate and rhythm, S1, S2 normal, no murmur, click, rub or gallop and normal apical impulse  Abdomen: soft, non-tender; bowel sounds normal; no masses, no organomegaly  Extremities: extremities normal, atraumatic, no cyanosis or edema  Pulses: 2+ and symmetric  Neurologic: Alert and oriented X 3, normal strength and tone. Normal symmetric reflexes. Normal coordination and gait   Adult ECG Report - not done Recent Labs: No recent labs  ASSESSMENT / PLAN: Hypertension, essential Pressure doesn't seem to be as well controlled as an alert expected. He initially had a good  effect from the chlorthalidone, however he probably needs some additional boost. Plan: Add losartan 25 mg  Mild obesity He is aware of the weight is gained. He is deathly active and exercise were recommended if 5 days week. He is however yet to adopt a good healthy diet. Discussed the importance of the the combination of diet plus exercise. Discussed various healthy diet  options.  Abnormal finding on EKG Relatively normal echocardiogram with no significant LVH. Some concentric LVH would go along with hypertension.    No orders of the defined types were placed in this encounter.   Meds ordered this encounter  Medications  . losartan (COZAAR) 25 MG tablet    Sig: Take 1 tablet (25 mg total) by mouth daily.    Dispense:  30 tablet    Refill:  6    Followup: 6 months  DAVID W. Ellyn Hack, M.D., M.S. Interventional Cardiologist [facility]

## 2014-03-19 NOTE — Assessment & Plan Note (Signed)
Relatively normal echocardiogram with no significant LVH. Some concentric LVH would go along with hypertension.

## 2014-04-07 ENCOUNTER — Ambulatory Visit (HOSPITAL_COMMUNITY)
Admission: RE | Admit: 2014-04-07 | Discharge: 2014-04-07 | Disposition: A | Discharge status: Home / Self Care | Payer: BC Managed Care – PPO | Source: Ambulatory Visit | Attending: Anesthesiology | Admitting: Anesthesiology

## 2014-04-07 ENCOUNTER — Encounter (HOSPITAL_COMMUNITY)
Admission: RE | Admit: 2014-04-07 | Discharge: 2014-04-07 | Disposition: A | Discharge status: Home / Self Care | Payer: BC Managed Care – PPO | Source: Ambulatory Visit | Attending: Urology | Admitting: Urology

## 2014-04-07 ENCOUNTER — Encounter (HOSPITAL_COMMUNITY): Payer: Self-pay | Admitting: Pharmacy Technician

## 2014-04-07 ENCOUNTER — Other Ambulatory Visit (HOSPITAL_COMMUNITY): Payer: Self-pay | Admitting: *Deleted

## 2014-04-07 ENCOUNTER — Encounter (HOSPITAL_COMMUNITY): Payer: Self-pay

## 2014-04-07 DIAGNOSIS — I10 Essential (primary) hypertension: Secondary | ICD-10-CM | POA: Insufficient documentation

## 2014-04-07 DIAGNOSIS — C61 Malignant neoplasm of prostate: Secondary | ICD-10-CM | POA: Diagnosis present

## 2014-04-07 DIAGNOSIS — Z01818 Encounter for other preprocedural examination: Secondary | ICD-10-CM | POA: Insufficient documentation

## 2014-04-07 LAB — BASIC METABOLIC PANEL
Anion gap: 12 (ref 5–15)
BUN: 9 mg/dL (ref 6–23)
CALCIUM: 11 mg/dL — AB (ref 8.4–10.5)
CHLORIDE: 104 meq/L (ref 96–112)
CO2: 23 mEq/L (ref 19–32)
CREATININE: 0.92 mg/dL (ref 0.50–1.35)
GFR calc Af Amer: >90 mL/min (ref >90)
GFR calc non Af Amer: >90 mL/min (ref >90)
GLUCOSE: 149 mg/dL — AB (ref 70–99)
Potassium: 4.9 mEq/L (ref 3.7–5.3)
Sodium: 139 mEq/L (ref 137–147)

## 2014-04-07 LAB — CBC
HCT: 43.3 % (ref 39.0–52.0)
Hemoglobin: 15.2 g/dL (ref 13.0–17.0)
MCH: 32 pg (ref 26.0–34.0)
MCHC: 35.1 g/dL (ref 30.0–36.0)
MCV: 91.2 fL (ref 78.0–100.0)
Platelets: 262 10*3/uL (ref 150–400)
RBC: 4.75 MIL/uL (ref 4.22–5.81)
RDW: 13.2 % (ref 11.5–15.5)
WBC: 5.7 10*3/uL (ref 4.0–10.5)

## 2014-04-07 NOTE — Progress Notes (Signed)
04/07/14 1433  OBSTRUCTIVE SLEEP APNEA  Have you ever been diagnosed with sleep apnea through a sleep study? No  Do you snore loudly (loud enough to be heard through closed doors)?  1  Do you often feel tired, fatigued, or sleepy during the daytime? 0  Has anyone observed you stop breathing during your sleep? 0  Do you have, or are you being treated for high blood pressure? 1  BMI more than 35 kg/m2? 0  Age over 57 years old? 1  Neck circumference greater than 40 cm/16 inches? 1  Gender: 1  Obstructive Sleep Apnea Score 5  Score 4 or greater  Results sent to PCP

## 2014-04-07 NOTE — Patient Instructions (Addendum)
20 CARTEL MAUSS  04/07/2014   Your procedure is scheduled on: Friday 04/09/2014  Report to [facility] Main Entrance and follow signs to  [place] at  [place] AM.  Call this number if you have problems the morning of surgery [phone]   Remember: [place] [facility]NY'S OFFICE!  Do not eat food or drink liquids :After Midnight.     Take these medicines the morning of surgery with A SIP OF WATER: NONE                               You may not have any metal on your body including hair pins and piercings  Do not wear jewelry, make-up, lotions, powders, or deodorant.   Men may shave face and neck.  Do not bring valuables to the hospital. [place].  Contacts, dentures or bridgework may not be worn into surgery.  Leave suitcase in the car. After surgery it may be brought to your room.  For patients admitted to the hospital, checkout time is 11:00 AM the day of discharge.   Patients discharged the day of surgery will not be allowed to drive home.  Name and phone number of your driver:N/A  Special Instructions: N/A ________________________________________________________________________  [facility] - Preparing for Surgery Before surgery, you can play an important role.  Because skin is not sterile, your skin needs to be as free of germs as possible.  You can reduce the number of germs on your skin by washing with CHG (chlorahexidine gluconate) soap before surgery.  CHG is an antiseptic cleaner which kills germs and bonds with the skin to continue killing germs even after washing. Please DO NOT use if you have an allergy to CHG or antibacterial soaps.  If your skin becomes reddened/irritated stop using the CHG and inform your nurse when you arrive at Short Stay. Do not shave (including legs and underarms) for at least 48 hours prior to the first CHG shower.  You may shave your face/neck. Please follow these  instructions carefully:  1.  Shower with CHG Soap the night before surgery and the  morning of Surgery.  2.  If you choose to wash your hair, wash your hair first as usual with your  normal  shampoo.  3.  After you shampoo, rinse your hair and body thoroughly to remove the  shampoo.                           4.  Use CHG as you would any other liquid soap.  You can apply chg directly  to the skin and wash                       Gently with a scrungie or clean washcloth.  5.  Apply the CHG Soap to your body ONLY FROM THE NECK DOWN.   Do not use on face/ open                           Wound or open sores. Avoid contact with eyes, ears mouth and genitals (private parts).                       Wash face,  Genitals (private parts) with your normal soap.  6.  Wash thoroughly, paying special attention to the area where your surgery  will be performed.  7.  Thoroughly rinse your body with warm water from the neck down.  8.  DO NOT shower/wash with your normal soap after using and rinsing off  the CHG Soap.                9.  Pat yourself dry with a clean towel.            10.  Wear clean pajamas.            11.  Place clean sheets on your bed the night of your first shower and do not  sleep with pets. Day of Surgery : Do not apply any lotions/deodorants the morning of surgery.  Please wear clean clothes to the hospital/surgery center.  FAILURE TO FOLLOW THESE INSTRUCTIONS MAY RESULT IN THE CANCELLATION OF YOUR SURGERY PATIENT SIGNATURE_________________________________  NURSE SIGNATURE__________________________________  ________________________________________________________________________   Adam Phenix  An incentive spirometer is a tool that can help keep your lungs clear and active. This tool measures how well you are filling your lungs with each breath. Taking long deep breaths may help reverse or decrease the chance of developing breathing (pulmonary) problems (especially  infection) following:  A long period of time when you are unable to move or be active. BEFORE THE PROCEDURE   If the spirometer includes an indicator to show your best effort, your nurse or respiratory therapist will set it to a desired goal.  If possible, sit up straight or lean slightly forward. Try not to slouch.  Hold the incentive spirometer in an upright position. INSTRUCTIONS FOR USE  1. Sit on the edge of your bed if possible, or sit up as far as you can in bed or on a chair. 2. Hold the incentive spirometer in an upright position. 3. Breathe out normally. 4. Place the mouthpiece in your mouth and seal your lips tightly around it. 5. Breathe in slowly and as deeply as possible, raising the piston or the ball toward the top of the column. 6. Hold your breath for 3-5 seconds or for as long as possible. Allow the piston or ball to fall to the bottom of the column. 7. Remove the mouthpiece from your mouth and breathe out normally. 8. Rest for a few seconds and repeat Steps 1 through 7 at least 10 times every 1-2 hours when you are awake. Take your time and take a few normal breaths between deep breaths. 9. The spirometer may include an indicator to show your best effort. Use the indicator as a goal to work toward during each repetition. 10. After each set of 10 deep breaths, practice coughing to be sure your lungs are clear. If you have an incision (the cut made at the time of surgery), support your incision when coughing by placing a pillow or rolled up towels firmly against it. Once you are able to get out of bed, walk around indoors and cough well. You may stop using the incentive spirometer when instructed by your caregiver.  RISKS AND COMPLICATIONS  Take your time so you do not get dizzy or light-headed.  If you are in pain, you may need to take or ask for pain medication before doing incentive spirometry. It is harder to take a deep breath if you are having pain. AFTER  USE  Rest and breathe slowly and easily.  It can be helpful to keep track of a log of  your progress. Your caregiver can provide you with a simple table to help with this. If you are using the spirometer at home, follow these instructions: [place] IF:   You are having difficultly using the spirometer.  You have trouble using the spirometer as often as instructed.  Your pain medication is not giving enough relief while using the spirometer.  You develop fever of 100.5 F (38.1 C) or higher. SEEK IMMEDIATE MEDICAL CARE IF:   You cough up bloody sputum that had not been present before.  You develop fever of 102 F (38.9 C) or greater.  You develop worsening pain at or near the incision site. MAKE SURE YOU:   Understand these instructions.  Will watch your condition.  Will get help right away if you are not doing well or get worse. Document Released: 11/19/2006 Document Revised: 10/01/2011 Document Reviewed: 01/20/2007 ExitCare Patient Information 2014 ExitCare, [place].   ________________________________________________________________________  WHAT IS A BLOOD TRANSFUSION? Blood Transfusion Information  A transfusion is the replacement of blood or some of its parts. Blood is made up of multiple cells which provide different functions.  Red blood cells carry oxygen and are used for blood loss replacement.  White blood cells fight against infection.  Platelets control bleeding.  Plasma helps clot blood.  Other blood products are available for specialized needs, such as hemophilia or other clotting disorders. BEFORE THE TRANSFUSION  Who gives blood for transfusions?   Healthy volunteers who are fully evaluated to make sure their blood is safe. This is blood bank blood. Transfusion therapy is the safest it has ever been in the practice of medicine. Before blood is taken from a donor, a complete history is taken to make sure that person has no history of diseases  nor engages in risky social behavior (examples are intravenous drug use or sexual activity with multiple partners). The donor's travel history is screened to minimize risk of transmitting infections, such as malaria. The donated blood is tested for signs of infectious diseases, such as HIV and hepatitis. The blood is then tested to be sure it is compatible with you in order to minimize the chance of a transfusion reaction. If you or a relative donates blood, this is often done in anticipation of surgery and is not appropriate for emergency situations. It takes many days to process the donated blood. RISKS AND COMPLICATIONS Although transfusion therapy is very safe and saves many lives, the main dangers of transfusion include:   Getting an infectious disease.  Developing a transfusion reaction. This is an allergic reaction to something in the blood you were given. Every precaution is taken to prevent this. The decision to have a blood transfusion has been considered carefully by your caregiver before blood is given. Blood is not given unless the benefits outweigh the risks. AFTER THE TRANSFUSION  Right after receiving a blood transfusion, you will usually feel much better and more energetic. This is especially true if your red blood cells have gotten low (anemic). The transfusion raises the level of the red blood cells which carry oxygen, and this usually causes an energy increase.  The nurse administering the transfusion will monitor you carefully for complications. HOME CARE INSTRUCTIONS  No special instructions are needed after a transfusion. You may find your energy is better. Speak with your caregiver about any limitations on activity for underlying diseases you may have. SEEK MEDICAL CARE IF:   Your condition is not improving after your transfusion.  You develop redness or  irritation at the intravenous (IV) site. SEEK IMMEDIATE MEDICAL CARE IF:  Any of the following symptoms occur over the  next 12 hours:  Shaking chills.  You have a temperature by mouth above 102 F (38.9 C), not controlled by medicine.  Chest, back, or muscle pain.  People around you feel you are not acting correctly or are confused.  Shortness of breath or difficulty breathing.  Dizziness and fainting.  You get a rash or develop hives.  You have a decrease in urine output.  Your urine turns a dark color or changes to pink, red, or brown. Any of the following symptoms occur over the next 10 days:  You have a temperature by mouth above 102 F (38.9 C), not controlled by medicine.  Shortness of breath.  Weakness after normal activity.  The white part of the eye turns yellow (jaundice).  You have a decrease in the amount of urine or are urinating less often.  Your urine turns a dark color or changes to pink, red, or brown. Document Released: 07/06/2000 Document Revised: 10/01/2011 Document Reviewed: 02/23/2008 ExitCare Patient Information 2014 ExitCare, [place].  _______________________________________________________________________   CLEAR LIQUID DIET   Foods Allowed                                                                     Foods Excluded  Coffee and tea, regular and decaf                             liquids that you cannot  Plain Jell-O in any flavor                                             see through such as: Fruit ices (not with fruit pulp)                                     milk, soups, orange juice  Iced Popsicles                                    All solid food Carbonated beverages, regular and diet                                    Cranberry, grape and apple juices Sports drinks like Gatorade Lightly seasoned clear broth or consume(fat free) Sugar, honey syrup  Sample Menu Breakfast                                Lunch                                     Supper Cranberry juice  Beef broth                            Chicken broth Jell-O                                      Grape juice                           Apple juice Coffee or tea                        Jell-O                                      Popsicle                                                Coffee or tea                        Coffee or tea  _____________________________________________________________________

## 2014-04-09 ENCOUNTER — Encounter (HOSPITAL_COMMUNITY)
Admission: RE | Disposition: A | Discharge status: Home / Self Care | Payer: Self-pay | Source: Ambulatory Visit | Attending: Urology

## 2014-04-09 ENCOUNTER — Inpatient Hospital Stay (HOSPITAL_COMMUNITY)
Admission: RE | Admit: 2014-04-09 | Discharge: 2014-04-10 | DRG: 708 | Disposition: A | Discharge status: Home / Self Care | Payer: BC Managed Care – PPO | Source: Ambulatory Visit | Attending: Urology | Admitting: Urology

## 2014-04-09 ENCOUNTER — Encounter (HOSPITAL_COMMUNITY): Payer: Self-pay | Admitting: *Deleted

## 2014-04-09 ENCOUNTER — Inpatient Hospital Stay (HOSPITAL_COMMUNITY): Payer: BC Managed Care – PPO | Admitting: Anesthesiology

## 2014-04-09 ENCOUNTER — Encounter (HOSPITAL_COMMUNITY): Payer: BC Managed Care – PPO | Admitting: Anesthesiology

## 2014-04-09 DIAGNOSIS — E669 Obesity, unspecified: Secondary | ICD-10-CM | POA: Diagnosis present

## 2014-04-09 DIAGNOSIS — Z841 Family history of disorders of kidney and ureter: Secondary | ICD-10-CM

## 2014-04-09 DIAGNOSIS — C61 Malignant neoplasm of prostate: Secondary | ICD-10-CM | POA: Diagnosis present

## 2014-04-09 DIAGNOSIS — Z6833 Body mass index (BMI) 33.0-33.9, adult: Secondary | ICD-10-CM

## 2014-04-09 DIAGNOSIS — Z91013 Allergy to seafood: Secondary | ICD-10-CM

## 2014-04-09 DIAGNOSIS — I10 Essential (primary) hypertension: Secondary | ICD-10-CM | POA: Diagnosis present

## 2014-04-09 DIAGNOSIS — Z8249 Family history of ischemic heart disease and other diseases of the circulatory system: Secondary | ICD-10-CM

## 2014-04-09 DIAGNOSIS — Z79899 Other long term (current) drug therapy: Secondary | ICD-10-CM

## 2014-04-09 DIAGNOSIS — N529 Male erectile dysfunction, unspecified: Secondary | ICD-10-CM | POA: Diagnosis present

## 2014-04-09 DIAGNOSIS — Z8546 Personal history of malignant neoplasm of prostate: Secondary | ICD-10-CM | POA: Diagnosis present

## 2014-04-09 HISTORY — PX: LYMPHADENECTOMY: SHX5960

## 2014-04-09 HISTORY — PX: ROBOT ASSISTED LAPAROSCOPIC RADICAL PROSTATECTOMY: SHX5141

## 2014-04-09 LAB — TYPE AND SCREEN
ABO/RH(D): A POS
Antibody Screen: NEGATIVE

## 2014-04-09 LAB — HEMOGLOBIN AND HEMATOCRIT, BLOOD
HCT: 38.9 % — ABNORMAL LOW (ref 39.0–52.0)
Hemoglobin: 13.3 g/dL (ref 13.0–17.0)

## 2014-04-09 LAB — ABO/RH: ABO/RH(D): A POS

## 2014-04-09 SURGERY — ROBOTIC ASSISTED LAPAROSCOPIC RADICAL PROSTATECTOMY
Anesthesia: General

## 2014-04-09 MED ORDER — OXYCODONE HCL 5 MG PO TABS
5.0000 mg | ORAL_TABLET | ORAL | Status: DC | PRN
  Administered 2014-04-09 – 2014-04-10 (×2): 5 mg via ORAL
  Filled 2014-04-09 (×2): qty 1

## 2014-04-09 MED ORDER — SUCCINYLCHOLINE CHLORIDE 20 MG/ML IJ SOLN
INTRAMUSCULAR | Status: DC | PRN
  Administered 2014-04-09: 100 mg via INTRAVENOUS

## 2014-04-09 MED ORDER — LACTATED RINGERS IR SOLN
Status: DC | PRN
  Administered 2014-04-09: 1000 mL

## 2014-04-09 MED ORDER — CISATRACURIUM BESYLATE (PF) 10 MG/5ML IV SOLN
INTRAVENOUS | Status: DC | PRN
  Administered 2014-04-09: 8 mg via INTRAVENOUS
  Administered 2014-04-09 (×2): 3 mg via INTRAVENOUS
  Administered 2014-04-09 (×2): 2 mg via INTRAVENOUS

## 2014-04-09 MED ORDER — METOCLOPRAMIDE HCL 5 MG/ML IJ SOLN
INTRAMUSCULAR | Status: DC | PRN
  Administered 2014-04-09: 10 mg via INTRAVENOUS

## 2014-04-09 MED ORDER — HYDROMORPHONE HCL 1 MG/ML IJ SOLN
0.2500 mg | INTRAMUSCULAR | Status: DC | PRN
  Administered 2014-04-09 (×4): 0.5 mg via INTRAVENOUS

## 2014-04-09 MED ORDER — HYDROMORPHONE HCL 1 MG/ML IJ SOLN
0.5000 mg | INTRAMUSCULAR | Status: DC | PRN
  Administered 2014-04-09 – 2014-04-10 (×3): 1 mg via INTRAVENOUS
  Filled 2014-04-09 (×3): qty 1

## 2014-04-09 MED ORDER — CEFAZOLIN SODIUM-DEXTROSE 2-3 GM-% IV SOLR
2.0000 g | INTRAVENOUS | Status: AC
  Administered 2014-04-09: 2 g via INTRAVENOUS

## 2014-04-09 MED ORDER — HYDROMORPHONE HCL 2 MG/ML IJ SOLN
INTRAMUSCULAR | Status: AC
  Filled 2014-04-09: qty 1

## 2014-04-09 MED ORDER — MIDAZOLAM HCL 5 MG/5ML IJ SOLN
INTRAMUSCULAR | Status: DC | PRN
  Administered 2014-04-09: 2 mg via INTRAVENOUS

## 2014-04-09 MED ORDER — CISATRACURIUM BESYLATE 20 MG/10ML IV SOLN
INTRAVENOUS | Status: AC
  Filled 2014-04-09: qty 10

## 2014-04-09 MED ORDER — HYDROMORPHONE HCL 1 MG/ML IJ SOLN
INTRAMUSCULAR | Status: DC | PRN
  Administered 2014-04-09: 1 mg via INTRAVENOUS

## 2014-04-09 MED ORDER — PROMETHAZINE HCL 25 MG/ML IJ SOLN: 6.2500 mg | INTRAMUSCULAR | Status: DC | PRN

## 2014-04-09 MED ORDER — CEFAZOLIN SODIUM-DEXTROSE 2-3 GM-% IV SOLR
INTRAVENOUS | Status: AC
  Filled 2014-04-09: qty 50

## 2014-04-09 MED ORDER — PHENYLEPHRINE HCL 10 MG/ML IJ SOLN
INTRAMUSCULAR | Status: DC | PRN
  Administered 2014-04-09: 120 ug via INTRAVENOUS

## 2014-04-09 MED ORDER — SODIUM CHLORIDE 0.9 % IV SOLN
INTRAVENOUS | Status: DC | PRN
  Administered 2014-04-09 (×2): via INTRAVENOUS

## 2014-04-09 MED ORDER — NEOSTIGMINE METHYLSULFATE 10 MG/10ML IV SOLN
INTRAVENOUS | Status: AC
  Filled 2014-04-09: qty 1

## 2014-04-09 MED ORDER — GLYCOPYRROLATE 0.2 MG/ML IJ SOLN
INTRAMUSCULAR | Status: AC
  Filled 2014-04-09: qty 4

## 2014-04-09 MED ORDER — HYDROMORPHONE HCL 1 MG/ML IJ SOLN
INTRAMUSCULAR | Status: AC
  Filled 2014-04-09: qty 1

## 2014-04-09 MED ORDER — LABETALOL HCL 5 MG/ML IV SOLN
5.0000 mg | INTRAVENOUS | Status: AC
  Administered 2014-04-09 (×4): 5 mg via INTRAVENOUS

## 2014-04-09 MED ORDER — BUPIVACAINE LIPOSOME 1.3 % IJ SUSP
INTRAMUSCULAR | Status: DC | PRN
  Administered 2014-04-09: 20 mL

## 2014-04-09 MED ORDER — PHENYLEPHRINE 40 MCG/ML (10ML) SYRINGE FOR IV PUSH (FOR BLOOD PRESSURE SUPPORT)
PREFILLED_SYRINGE | INTRAVENOUS | Status: AC
  Filled 2014-04-09: qty 10

## 2014-04-09 MED ORDER — FENTANYL CITRATE 0.05 MG/ML IJ SOLN
INTRAMUSCULAR | Status: DC | PRN
  Administered 2014-04-09: 100 ug via INTRAVENOUS
  Administered 2014-04-09 (×2): 50 ug via INTRAVENOUS

## 2014-04-09 MED ORDER — FENTANYL CITRATE 0.05 MG/ML IJ SOLN
INTRAMUSCULAR | Status: AC
  Filled 2014-04-09: qty 5

## 2014-04-09 MED ORDER — MEPERIDINE HCL 50 MG/ML IJ SOLN
6.2500 mg | INTRAMUSCULAR | Status: DC | PRN
  Administered 2014-04-09 (×2): 12.5 mg via INTRAVENOUS

## 2014-04-09 MED ORDER — PROPOFOL 10 MG/ML IV BOLUS
INTRAVENOUS | Status: DC | PRN
  Administered 2014-04-09: 150 mg via INTRAVENOUS

## 2014-04-09 MED ORDER — ACETAMINOPHEN 500 MG PO TABS
1000.0000 mg | ORAL_TABLET | Freq: Four times a day (QID) | ORAL | Status: AC
  Administered 2014-04-09 – 2014-04-10 (×4): 1000 mg via ORAL
  Filled 2014-04-09 (×5): qty 2

## 2014-04-09 MED ORDER — METOCLOPRAMIDE HCL 5 MG/ML IJ SOLN
INTRAMUSCULAR | Status: AC
  Filled 2014-04-09: qty 2

## 2014-04-09 MED ORDER — NEOSTIGMINE METHYLSULFATE 10 MG/10ML IV SOLN
INTRAVENOUS | Status: DC | PRN
  Administered 2014-04-09: 5 mg via INTRAVENOUS

## 2014-04-09 MED ORDER — BUPIVACAINE LIPOSOME 1.3 % IJ SUSP
20.0000 mL | Freq: Once | INTRAMUSCULAR | Status: DC
  Filled 2014-04-09: qty 20

## 2014-04-09 MED ORDER — LABETALOL HCL 5 MG/ML IV SOLN
INTRAVENOUS | Status: AC
  Filled 2014-04-09: qty 4

## 2014-04-09 MED ORDER — LACTATED RINGERS IV SOLN
INTRAVENOUS | Status: DC
  Administered 2014-04-09: 13:00:00 via INTRAVENOUS

## 2014-04-09 MED ORDER — MIDAZOLAM HCL 2 MG/2ML IJ SOLN
INTRAMUSCULAR | Status: AC
  Filled 2014-04-09: qty 2

## 2014-04-09 MED ORDER — MENTHOL 3 MG MT LOZG
1.0000 | LOZENGE | OROMUCOSAL | Status: DC | PRN
  Filled 2014-04-09: qty 9

## 2014-04-09 MED ORDER — DEXAMETHASONE SODIUM PHOSPHATE 10 MG/ML IJ SOLN
INTRAMUSCULAR | Status: DC | PRN
  Administered 2014-04-09: 10 mg via INTRAVENOUS

## 2014-04-09 MED ORDER — MEPERIDINE HCL 50 MG/ML IJ SOLN
INTRAMUSCULAR | Status: AC
  Filled 2014-04-09: qty 1

## 2014-04-09 MED ORDER — SODIUM CHLORIDE 0.9 % IV BOLUS (SEPSIS): 1000.0000 mL | Freq: Once | INTRAVENOUS | Status: DC

## 2014-04-09 MED ORDER — HYDROCODONE-ACETAMINOPHEN 5-325 MG PO TABS: 1.0000 | ORAL_TABLET | Freq: Four times a day (QID) | ORAL | Status: DC | PRN

## 2014-04-09 MED ORDER — SODIUM CHLORIDE 0.9 % IJ SOLN
INTRAMUSCULAR | Status: AC
  Filled 2014-04-09: qty 20

## 2014-04-09 MED ORDER — PROPOFOL 10 MG/ML IV BOLUS
INTRAVENOUS | Status: AC
  Filled 2014-04-09: qty 20

## 2014-04-09 MED ORDER — CIPROFLOXACIN HCL 500 MG PO TABS: 500.0000 mg | ORAL_TABLET | Freq: Two times a day (BID) | ORAL | Status: DC

## 2014-04-09 MED ORDER — DEXTROSE-NACL 5-0.45 % IV SOLN
INTRAVENOUS | Status: DC
  Administered 2014-04-09 – 2014-04-10 (×3): via INTRAVENOUS

## 2014-04-09 MED ORDER — GLYCOPYRROLATE 0.2 MG/ML IJ SOLN
INTRAMUSCULAR | Status: DC | PRN
  Administered 2014-04-09: .8 mg via INTRAVENOUS

## 2014-04-09 MED ORDER — LACTATED RINGERS IV SOLN
INTRAVENOUS | Status: DC | PRN
Start: 2014-04-09 — End: 2014-04-09
  Administered 2014-04-09: 07:00:00 via INTRAVENOUS

## 2014-04-09 MED ORDER — BELLADONNA ALKALOIDS-OPIUM 16.2-60 MG RE SUPP
RECTAL | Status: AC
  Filled 2014-04-09: qty 1

## 2014-04-09 MED ORDER — LOSARTAN POTASSIUM 25 MG PO TABS
25.0000 mg | ORAL_TABLET | Freq: Every morning | ORAL | Status: DC
  Administered 2014-04-09 – 2014-04-10 (×2): 25 mg via ORAL
  Filled 2014-04-09 (×2): qty 1

## 2014-04-09 MED ORDER — DEXAMETHASONE SODIUM PHOSPHATE 10 MG/ML IJ SOLN
INTRAMUSCULAR | Status: AC
  Filled 2014-04-09: qty 1

## 2014-04-09 MED ORDER — BELLADONNA ALKALOIDS-OPIUM 16.2-60 MG RE SUPP
1.0000 | Freq: Four times a day (QID) | RECTAL | Status: DC | PRN
  Administered 2014-04-09: 1 via RECTAL

## 2014-04-09 MED ORDER — ONDANSETRON HCL 4 MG/2ML IJ SOLN
INTRAMUSCULAR | Status: AC
  Filled 2014-04-09: qty 2

## 2014-04-09 MED ORDER — SODIUM CHLORIDE 0.9 % IJ SOLN
INTRAMUSCULAR | Status: AC
  Filled 2014-04-09: qty 10

## 2014-04-09 MED ORDER — ONDANSETRON HCL 4 MG/2ML IJ SOLN
INTRAMUSCULAR | Status: DC | PRN
  Administered 2014-04-09: 4 mg via INTRAVENOUS

## 2014-04-09 SURGICAL SUPPLY — 54 items
CABLE HIGH FREQUENCY MONO STRZ (ELECTRODE) ×3 IMPLANT
CANISTER SUCTION 2500CC (MISCELLANEOUS) IMPLANT
CATH FOLEY 2WAY SLVR 18FR 30CC (CATHETERS) ×3 IMPLANT
CATH TIEMANN FOLEY 18FR 5CC (CATHETERS) ×3 IMPLANT
CHLORAPREP W/TINT 26ML (MISCELLANEOUS) ×3 IMPLANT
CLIP LIGATING HEM O LOK PURPLE (MISCELLANEOUS) ×9 IMPLANT
CLOTH BEACON ORANGE TIMEOUT ST (SAFETY) ×3 IMPLANT
CONT SPEC 4OZ CLIKSEAL STRL BL (MISCELLANEOUS) ×3 IMPLANT
COVER SURGICAL LIGHT HANDLE (MISCELLANEOUS) ×3 IMPLANT
COVER TIP SHEARS 8 DVNC (MISCELLANEOUS) ×2 IMPLANT
COVER TIP SHEARS 8MM DA VINCI (MISCELLANEOUS) ×1
CUTTER ECHEON FLEX ENDO 45 340 (ENDOMECHANICALS) ×3 IMPLANT
DECANTER SPIKE VIAL GLASS SM (MISCELLANEOUS) IMPLANT
DERMABOND ADVANCED (GAUZE/BANDAGES/DRESSINGS) ×1
DERMABOND ADVANCED .7 DNX12 (GAUZE/BANDAGES/DRESSINGS) ×2 IMPLANT
DRSG TEGADERM 4X4.75 (GAUZE/BANDAGES/DRESSINGS) ×6 IMPLANT
DRSG TEGADERM 6X8 (GAUZE/BANDAGES/DRESSINGS) ×3 IMPLANT
ELECT REM PT RETURN 9FT ADLT (ELECTROSURGICAL) ×3
ELECTRODE REM PT RTRN 9FT ADLT (ELECTROSURGICAL) ×2 IMPLANT
GAUZE SPONGE 2X2 8PLY STRL LF (GAUZE/BANDAGES/DRESSINGS) ×2 IMPLANT
GLOVE BIO SURGEON STRL SZ 6.5 (GLOVE) ×3 IMPLANT
GLOVE BIOGEL M STRL SZ7.5 (GLOVE) ×15 IMPLANT
GLOVE BIOGEL PI IND STRL 7.5 (GLOVE) IMPLANT
GLOVE BIOGEL PI INDICATOR 7.5 (GLOVE)
GOWN STRL REUS W/TWL LRG LVL4 (GOWN DISPOSABLE) ×15 IMPLANT
HOLDER FOLEY CATH W/STRAP (MISCELLANEOUS) ×3 IMPLANT
IV LACTATED RINGERS 1000ML (IV SOLUTION) ×3 IMPLANT
KIT ACCESSORY DA VINCI DISP (KITS)
KIT ACCESSORY DVNC DISP (KITS) IMPLANT
KIT PROCEDURE DA VINCI SI (MISCELLANEOUS) ×1
KIT PROCEDURE DVNC SI (MISCELLANEOUS) ×2 IMPLANT
NEEDLE INSUFFLATION 14GA 120MM (NEEDLE) ×3 IMPLANT
NEEDLE SPNL 22GX7 SPINOC (NEEDLE) ×3 IMPLANT
PACK ROBOT UROLOGY CUSTOM (CUSTOM PROCEDURE TRAY) ×3 IMPLANT
POUCH ENDO CATCH II 15MM (MISCELLANEOUS) ×3 IMPLANT
RELOAD GREEN ECHELON 45 (STAPLE) ×3 IMPLANT
RELOAD WH ECHELON 45 (STAPLE) ×15 IMPLANT
SET TUBE IRRIG SUCTION NO TIP (IRRIGATION / IRRIGATOR) ×3 IMPLANT
SOLUTION ELECTROLUBE (MISCELLANEOUS) ×3 IMPLANT
SPONGE GAUZE 2X2 STER 10/PKG (GAUZE/BANDAGES/DRESSINGS) ×1
SPONGE LAP 4X18 X RAY DECT (DISPOSABLE) ×3 IMPLANT
SUT ETHILON 3 0 PS 1 (SUTURE) ×3 IMPLANT
SUT MNCRL AB 4-0 PS2 18 (SUTURE) ×6 IMPLANT
SUT PDS AB 1 CT1 27 (SUTURE) ×9 IMPLANT
SUT VIC AB 2-0 SH 27 (SUTURE) ×4
SUT VIC AB 2-0 SH 27X BRD (SUTURE) ×8 IMPLANT
SUT VICRYL 0 UR6 27IN ABS (SUTURE) ×3 IMPLANT
SUT VLOC BARB 180 ABS3/0GR12 (SUTURE) ×9
SUTURE VLOC BRB 180 ABS3/0GR12 (SUTURE) ×6 IMPLANT
SYR 27GX1/2 1ML LL SAFETY (SYRINGE) ×3 IMPLANT
TOWEL OR 17X26 10 PK STRL BLUE (TOWEL DISPOSABLE) ×3 IMPLANT
TOWEL OR NON WOVEN STRL DISP B (DISPOSABLE) ×3 IMPLANT
TROCAR 12M 150ML BLUNT (TROCAR) ×3 IMPLANT
WATER STERILE IRR 1500ML POUR (IV SOLUTION) ×6 IMPLANT

## 2014-04-09 NOTE — H&P (Signed)
Derrick Walker is an 58 y.o. male.    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 CT and bone scan w/o distant disease. TRUS 152gm without significant median lobe or intravesical component.  PMH sig for HTN, Obesity. No CV disease. No strong blood thinners.   Today Derrick Walker is seen to proceed with robotic prostatectomy as primary therapy for his prostate cancer.   Past Medical History  Diagnosis Date  . Hypertension, essential   . Elevated PSA   . Nocturia   . Prostate cancer 12/11/13    Gleason 6, volume 152.41 cc (TRUS 152gm) ; in RMM, RA, LLB, LMB; Bone scan with no distant disease.  . Erectile dysfunction     Past Surgical History  Procedure Laterality Date  . Transthoracic echocardiogram  10-14-2013   dr Derrick Walker harding    mild LVH/  ef  20-25%/  grade I diastolic dysfunction/  mild LAE  . Prostate biopsy N/A 12/11/2013    Procedure: BIOPSY TRANSRECTAL ULTRASONIC PROSTATE (TUBP);  Surgeon: Derrick Persons, MD;  [facility];  Service: Urology;  Laterality: N/A;    Family History  Problem Relation Age of Onset  . Hypertension Mother   . Hypertension Brother   . Hypertension Maternal Grandmother   . Hypertension Paternal Grandmother   . Kidney failure Father    Social History:  reports that he has never smoked. He has never used smokeless tobacco. He reports that he drinks about 3.5 ounces of alcohol per week. He reports that he does not use illicit drugs.  Allergies:  Allergies  Allergen Reactions  . Shellfish Allergy Anaphylaxis    Medications Prior to Admission  Medication Sig Dispense Refill  . diphenhydrAMINE (BENADRYL) 25 mg capsule Take 25 mg by mouth every 6 (six) hours as needed for itching.      . losartan (COZAAR) 25 MG tablet Take 25 mg by mouth every morning.      . Multiple Vitamin (MULTIVITAMIN) tablet Take 1  tablet by mouth daily.        Results for orders placed during the hospital encounter of 04/07/14 (from the past 48 hour(s))  BASIC METABOLIC PANEL     Status: Abnormal   Collection Time    04/07/14  1:20 PM      Result Value Ref Range   Sodium 139  137 - 147 mEq/L   Potassium 4.9  3.7 - 5.3 mEq/L   Comment: MODERATE HEMOLYSIS     HEMOLYSIS AT THIS LEVEL MAY AFFECT RESULT   Chloride 104  96 - 112 mEq/L   CO2 23  19 - 32 mEq/L   Glucose, Bld 149 (*) 70 - 99 mg/dL   BUN 9  6 - 23 mg/dL   Creatinine, Ser 0.92  0.50 - 1.35 mg/dL   Calcium 11.0 (*) 8.4 - 10.5 mg/dL   GFR calc non Af Amer >90  >90 mL/min   GFR calc Af Amer >90  >90 mL/min   Comment: (NOTE)     The eGFR has been calculated using the CKD EPI equation.     This calculation has not been validated in all clinical situations.     eGFR's persistently <90 mL/min signify possible Chronic Kidney     Disease.   Anion gap 12  5 - 15  CBC     Status: None  Collection Time    04/07/14  1:20 PM      Result Value Ref Range   WBC 5.7  4.0 - 10.5 K/uL   RBC 4.75  4.22 - 5.81 MIL/uL   Hemoglobin 15.2  13.0 - 17.0 g/dL   HCT 43.3  39.0 - 52.0 %   MCV 91.2  78.0 - 100.0 fL   MCH 32.0  26.0 - 34.0 pg   MCHC 35.1  30.0 - 36.0 g/dL   RDW 13.2  11.5 - 15.5 %   Platelets 262  150 - 400 K/uL   Dg Chest 2 View  04/07/2014   CLINICAL DATA:  Preoperative exam for prostatectomy ; history of hypertension  EXAM: CHEST  2 VIEW  COMPARISON:  None.  FINDINGS: The lungs are adequately inflated and clear. The heart and pulmonary vascularity are normal. There is calcification that projects in the soft tissues adjacent to the carina likely reflecting previous granulomatous infection. There is no pleural effusion. The observed bony thorax is unremarkable.  IMPRESSION: There is no active cardiopulmonary disease.   Electronically Signed   By: Derrick Walker   On: 04/07/2014 17:18    Review of Systems  Constitutional: Negative for fever and chills.   HENT: Negative.   Eyes: Negative.   Respiratory: Negative.   Cardiovascular: Negative.   Gastrointestinal: Negative.   Genitourinary: Negative.   Musculoskeletal: Negative.   Skin: Negative.   Neurological: Negative.   Endo/Heme/Allergies: Negative.   Psychiatric/Behavioral: Negative.     Blood pressure 145/93, pulse 72, temperature 98.1 F (36.7 C), temperature source Oral, resp. rate 18, height _0  (1.803 m), weight 109.572 kg (241 lb 9 oz), SpO2 100.00%. Physical Exam  Constitutional: He appears well-developed.  HENT:  Head: Normocephalic.  Eyes: Pupils are equal, round, and reactive to light.  Neck: Normal range of motion. Neck supple.  Cardiovascular: Normal rate and regular rhythm.   Respiratory: Effort normal.  GI: Soft. Bowel sounds are normal.  Genitourinary: Penis normal.  No CVAT  Musculoskeletal: Normal range of motion.  Neurological: He is alert.  Skin: Skin is warm and dry.  Psychiatric: He has a normal mood and affect. His behavior is normal. Judgment and thought content normal.     Assessment/Plan   1 - Prostate Cancer -  We rediscussed prostatectomy and specifically robotic prostatectomy with bilateral pelvic lymphadenectomy being the technique that I most commonly perform. I showed the patient on their abdomen the approximately 6 small incision (trocar) sites as well as presumed extraction sites with robotic approach as well as possible open incision sites should open conversion be necessary. We rediscussed peri-operative risks including bleeding, infection, deep vein thrombosis, pulmonary embolism, compartment syndrome, nuropathy / neuropraxia, heart attack, stroke, death, as well as long-term risks such as non-cure / need for additional therapy. We specifically readdressed that the procedure would compromise urinary control leading to stress incontinence which typically resolves with time and pelvic rehabilitation (Kegel's, etc..), but can sometimes be  permanent and require additional therapy including surgery. We also specifically readdressed sexual sequellae including significant erectile dysfunction which typically partially resolves with time but can also be permanent and require additional therapy including surgery.   We rediscussed the typical hospital course including usual 1-2 night hospitalization, discharge with foley catheter in place usually for 1-2 weeks before voiding trial as well as usually 2 week recovery until able to perform most non-strenuous activity and 6 weeks until able to return to most jobs and more strenuous activity such  as exercise.     Leiana Rund 04/09/2014, 5:53 AM

## 2014-04-09 NOTE — Discharge Instructions (Signed)

## 2014-04-09 NOTE — Anesthesia Postprocedure Evaluation (Signed)
  Anesthesia Post-op Note  Patient: Derrick Walker  Procedure(s) Performed: Procedure(s) (LRB): ROBOTIC ASSISTED LAPAROSCOPIC RADICAL PROSTATECTOMY, INDOCYANINE GREEN DYE  (N/A) BILATERAL PELVIC LYMPH NODE DISSECTION (Bilateral)  Patient Location: PACU  Anesthesia Type: General  Level of Consciousness: awake and alert   Airway and Oxygen Therapy: Patient Spontanous Breathing  Post-op Pain: mild  Post-op Assessment: Post-op Vital signs reviewed, Patient's Cardiovascular Status Stable, Respiratory Function Stable, Patent Airway and No signs of Nausea or vomiting  Last Vitals:  Filed Vitals:   04/09/14 1245  BP: 177/94  Pulse: 90  Temp:   Resp: 17    Post-op Vital Signs: stable   Complications: No apparent anesthesia complications

## 2014-04-09 NOTE — Transfer of Care (Signed)
Immediate Anesthesia Transfer of Care Note  Patient: KASHEEM TONER  Procedure(s) Performed: Procedure(s): ROBOTIC ASSISTED LAPAROSCOPIC RADICAL PROSTATECTOMY, INDOCYANINE GREEN DYE  (N/A) BILATERAL PELVIC LYMPH NODE DISSECTION (Bilateral)  Patient Location: PACU  Anesthesia Type:General  Level of Consciousness: awake, sedated and patient cooperative  Airway & Oxygen Therapy: Patient Spontanous Breathing and Patient connected to face mask oxygen  Post-op Assessment: Report given to PACU RN and Post -op Vital signs reviewed and stable  Post vital signs: Reviewed and stable  Complications: No apparent anesthesia complications

## 2014-04-09 NOTE — Brief Op Note (Signed)
04/09/2014  5:48 PM  PATIENT:  Derrick Walker  58 y.o. male  PRE-OPERATIVE DIAGNOSIS:  PROSTATE CANCER  POST-OPERATIVE DIAGNOSIS:  prostate cancer  PROCEDURE:  Procedure(s): ROBOTIC ASSISTED LAPAROSCOPIC RADICAL PROSTATECTOMY, INDOCYANINE GREEN DYE  (N/A) BILATERAL PELVIC LYMPH NODE DISSECTION (Bilateral)  SURGEON:  Surgeon(s) and Role:    * Alexis Frock, MD - Primary  PHYSICIAN ASSISTANT:  Debbrah Alar, PA  ASSISTANTS: none   ANESTHESIA:   local and general  EBL:  Total I/O In: 3800 [I.V.:2800; IV Piggyback:1000] Out: 3025 [Urine:2600; Drains:225; Blood:200]  BLOOD ADMINISTERED:none  DRAINS: JP to bulb suction   LOCAL MEDICATIONS USED:  MARCAINE     SPECIMEN:  Source of Specimen:  pelvic lymph nodes, prostatectomy, periprostatic fat, rt perivesical lymph node  DISPOSITION OF SPECIMEN:  PATHOLOGY  COUNTS:  YES  TOURNIQUET:  * No tourniquets in log *  DICTATION: .Other Dictation: Dictation Number [id number]  PLAN OF CARE: Admit to inpatient   PATIENT DISPOSITION:  PACU - hemodynamically stable.   Delay start of Pharmacological VTE agent (>24hrs) due to surgical blood loss or risk of bleeding: yes

## 2014-04-09 NOTE — Anesthesia Preprocedure Evaluation (Signed)
Anesthesia Evaluation  Patient identified by MRN, date of birth, ID band Patient awake    Reviewed: Allergy & Precautions, H&P , NPO status , Patient's Chart, lab work & pertinent test results  Airway Mallampati: II TM Distance: >3 FB Neck ROM: Full    Dental no notable dental hx.    Pulmonary neg pulmonary ROS,  breath sounds clear to auscultation  Pulmonary exam normal       Cardiovascular hypertension, Pt. on medications Rhythm:Regular Rate:Normal     Neuro/Psych negative neurological ROS  negative psych ROS   GI/Hepatic negative GI ROS, Neg liver ROS,   Endo/Other  negative endocrine ROS  Renal/GU negative Renal ROS  negative genitourinary   Musculoskeletal negative musculoskeletal ROS (+)   Abdominal   Peds negative pediatric ROS (+)  Hematology negative hematology ROS (+)   Anesthesia Other Findings   Reproductive/Obstetrics negative OB ROS                           Anesthesia Physical Anesthesia Plan  ASA: II  Anesthesia Plan: General   Post-op Pain Management:    Induction: Intravenous  Airway Management Planned: Oral ETT  Additional Equipment:   Intra-op Plan:   Post-operative Plan: Extubation in OR  Informed Consent: I have reviewed the patients History and Physical, chart, labs and discussed the procedure including the risks, benefits and alternatives for the proposed anesthesia with the patient or authorized representative who has indicated his/her understanding and acceptance.   Dental advisory given  Plan Discussed with: CRNA  Anesthesia Plan Comments:         Anesthesia Quick Evaluation

## 2014-04-10 LAB — BASIC METABOLIC PANEL
Anion gap: 13 (ref 5–15)
BUN: 10 mg/dL (ref 6–23)
CO2: 24 mEq/L (ref 19–32)
CREATININE: 1.07 mg/dL (ref 0.50–1.35)
Calcium: 9.6 mg/dL (ref 8.4–10.5)
Chloride: 102 mEq/L (ref 96–112)
GFR, EST AFRICAN AMERICAN: 88 mL/min — AB (ref >90)
GFR, EST NON AFRICAN AMERICAN: 76 mL/min — AB (ref >90)
Glucose, Bld: 159 mg/dL — ABNORMAL HIGH (ref 70–99)
Potassium: 4.1 mEq/L (ref 3.7–5.3)
Sodium: 139 mEq/L (ref 137–147)

## 2014-04-10 LAB — CREATININE, FLUID (PLEURAL, PERITONEAL, JP DRAINAGE): Creat, Fluid: 0.9 mg/dL

## 2014-04-10 LAB — HEMOGLOBIN AND HEMATOCRIT, BLOOD
HCT: 39.6 % (ref 39.0–52.0)
Hemoglobin: 13.1 g/dL (ref 13.0–17.0)

## 2014-04-10 NOTE — Progress Notes (Signed)
1 Day Post-Op Subjective: NAEON. Doing well. Ambulated. Pain controlled. Asking for food.   Objective: Vital signs in last 24 hours: Temp:  [98 F (36.7 C)-99.6 F (37.6 C)] 98.2 F (36.8 C) (09/19 0538) Pulse Rate:  [75-99] 75 (09/19 0538) Resp:  [11-20] 18 (09/19 0538) BP: (131-191)/(67-108) 131/67 mmHg (09/19 0538) SpO2:  [92 %-100 %] 100 % (09/19 0538) Weight:  [109.57 kg (241 lb 8.9 oz)] 109.57 kg (241 lb 8.9 oz) (09/18 1352)  Intake/Output from previous day: 09/18 0701 - 09/19 0700 In: 4443.8 [I.V.:3443.8; IV Piggyback:1000] Out: 4105 [Urine:3550; Drains:355; Blood:200] Intake/Output this shift:    Physical Exam:  General: Alert and oriented CV: RRR Lungs: Clear Abdomen: Soft, ND, appropriately tender Incisions: C/D/I JP: serosanguinous Foley: clear yellow Ext: NT, No erythema  Lab Results:  Recent Labs  04/07/14 1320 04/09/14 1210 04/10/14 0407  HGB 15.2 13.3 13.1  HCT 43.3 38.9* 39.6   BMET  Recent Labs  04/07/14 1320 04/10/14 0407  NA 139 139  K 4.9 4.1  CL 104 102  CO2 23 24  GLUCOSE 149* 159*  BUN 9 10  CREATININE 0.92 1.07  CALCIUM 11.0* 9.6     Studies/Results: No results found.  Assessment/Plan: POD#1 from robotic prostatectomy and bilateral PLND. Doing well. -- advance diet, medlock -- check JP Cr and d/c if negative -- d/c home with foley later today    LOS: 1 day   Derrick Walker C 04/10/2014, 7:36 AM

## 2014-04-10 NOTE — Discharge Summary (Signed)
Physician Discharge Summary  Patient ID: Derrick Walker MRN: [id number] DOB/AGE: [birth year] 58 y.o.  Admit date: 04/09/2014 Discharge date: 04/10/2014  Admission Diagnoses:  Discharge Diagnoses:  Active Problems:   Malignant neoplasm of prostate   Discharged Condition: stable  Hospital Course: The patient underwent robotic laparoscopic prostatectomy with bilateral pelvic lymph node dissection. They were taken to PACU for routine recovery before transfer to the floor. Their diet was advanced to regular on POD#1. Their pain was adequately controlled on PO pain pills. They were able to ambulate. JP drain was removed on POD#1. They received foley catheter teaching. They were deemed suitable for discharge on POD#1.  Consults: None  Significant Diagnostic Studies: none  Treatments: surgery: RALP with Bilateral PLND with ICG  Discharge Exam: Blood pressure 131/67, pulse 75, temperature 98.2 F (36.8 C), temperature source Oral, resp. rate 18, height 5\' 11"  (1.803 m), weight 109.57 kg (241 lb 8.9 oz), SpO2 100.00%. AAOx3, in NAD, normal WOB. Incisions C/D/I. Abdomen soft, NT/ND. Foley clear pink. Extremities WWP, no edema  Disposition: 01-Home or Self Care     Medication List    STOP taking these medications       multivitamin tablet      TAKE these medications       ciprofloxacin 500 MG tablet  Commonly known as:  CIPRO  Take 1 tablet (500 mg total) by mouth 2 (two) times daily. Start day prior to office visit for foley removal     diphenhydrAMINE 25 mg capsule  Commonly known as:  BENADRYL  Take 25 mg by mouth every 6 (six) hours as needed for itching.     HYDROcodone-acetaminophen 5-325 MG per tablet  Commonly known as:  NORCO  Take 1-2 tablets by mouth every 6 (six) hours as needed.     losartan 25 MG tablet  Commonly known as:  COZAAR  Take 25 mg by mouth every morning.           Follow-up Information   Follow up with Alexis Frock, MD On 04/19/2014. (at  9:15)    Specialty:  Urology   Contact information:   [place] [address] [phone]       Signed: Milon Score 04/10/2014, 9:20 AM

## 2014-04-10 NOTE — Op Note (Signed)
NAMEMarland Kitchen  Derrick Walker, Derrick Walker               ACCOUNT NO.:  [id number]  MEDICAL RECORD NO.:  [id number]  LOCATION:  9242                         FACILITY:  [facility]  PHYSICIAN:  Alexis Frock, MD     DATE OF BIRTH:  1956-06-25  DATE OF PROCEDURE: 04/09/2014  DATE OF DISCHARGE:                              OPERATIVE REPORT  DIAGNOSIS:  Moderate risk prostate cancer and very large prostate.  PROCEDURES: 1. Robotic-assisted laparoscopic radical prostatectomy. 2. Bilateral pelvic lymphadenectomy, laparoscopic. 3. Injection of Indocyanine green dye for sentinel lymphangiography.  ESTIMATED BLOOD LOSS:  100 mL.  COMPLICATIONS:  None.  SPECIMENS: 1. Radical prostatectomy. 2. Periprostatic fat. 3. Right external iliac lymph nodes. 4. Right obturator lymph nodes. 5. Right perivesical lymph node, sentinel. 6. Left external iliac lymph nodes. 7. Left obturator lymph nodes.  FINDINGS: 1. Very large prostate with inherently somewhat wide bladder neck     necessitating bladder neck reconstruction. 2. Sentinel lymphatic channels were seen and coursing over the right     lateral bladder, area of suspected sentinel node, right perivesical     only.  INDICATIONS:  Derrick Walker is a very pleasant 58 year old gentleman who was found on workup of elevated PSA to have moderate-risk multifocal prostate cancer and a very very large prostate of estimated-size 150 g. Options were discussed for definitive management including surveillance protocols versus ablative therapies versus surgery with and without minimally invasive assistance and he adamantly wished to proceed with surgery with robotic prostatectomy.  We had preoperatively discussed the unique challenges of removal of the gland in size, associated sometimes slower return to continence.  He has very good understanding of this. Informed consent was obtained and placed in the medical record.  PROCEDURE IN DETAIL:  The patient being Derrick Walker, was  verified. Procedure being robotic prostatectomy was confirmed.  Procedure was carried out.  Time-out was performed.  Intravenous antibiotics were administered.  General endotracheal anesthesia was introduced.  The patient was placed into a low lithotomy position, and sterile field was created by prepping and draping the patient's penis, perineum, and proximal thighs using non-iodinated scrub in his infra-xiphoid abdomen using chlorhexidine gluconate after clipper shaving.  Notably, his arms were tucked, bony prominence was suitably padded and he was further fashioned on the operative table using 3-inch tape over his chest over padding.  A test of steep Trendelenburg position was performed and was found to be adequately positioned.  Next, a high-flow, low-pressure pneumoperitoneum was obtained using Veress technique in the infraumbilical midline having passed the aspiration and drop test. Next, a 12-mm robotic camera port was placed in this location. Laparoscopic examination of peritoneal cavity revealed no significant adhesions and no visceral injury.  Additional ports were in place as follows; right paramedian 8-mm robotic port, right far lateral 12-mm assist port, left paramedian 8-mm robotic port, left far lateral 8-mm robotic port, right paramedian 5-mm suction port.  Robot was docked and passed through electronic checks.  Initial attention was directed at development of space of Retzius.  Incision was made lateral to the left medial umbilical ligament from the midline towards the area of the internal ring and coursing along the iliac vessels.  During this  dissection, the vas deferens was encounter and doubly ligated and used the bucket-handle to provide gentle medial traction to the bladder wall. Dissection was then proceeded lateral to the left bladder towards the area of the endopelvic fascia.  A mirror image dissection was performed on the right side sweeping the right bladder way  from the pelvic sidewall and transecting the right vas deferens.  Anterior attachments were taken down using cautery scissors.  The bilateral ureters were inspected and found to be uninjured during this resection.  The anterior surface of the prostate was defatted to better define the prostate- bladder neck junction.  This set aside, labeled the periprostatic fat. Next, 0.4 mL of indocyanine green dye was injected under robotic guidance with a percutaneously placed 18-gauge Chiba needle in the suprapubic location using intervening aspiration and coagulation of injection site to prevent dye spillage, did not occur.  Next, endopelvic fascia was carefully swept away from the lateral aspect of the prostate in a base-to-apex orientation.  This exposed the dorsal venous complex, which was controlled using vascular load stapler.  It had been approximately 10-15 minutes post dye injection and the pelvis was inspected under near infrared fluorescence.  Sentinel lymphangiography revealed several lymphatic channels coursing across the anterior surface of the prostate.  Several channel was co- blast and coursed across the right lateral aspect of the bladder and some conglomeration, which appeared to be suspicious for sentinel node in the right perivesical location.  The internal, external and obturator group lymph nodes bilaterally had no obvious fluorescent tissue. Attention was then directed to the pelvic lymphadenectomy on the right side.  All fiber fatty tissue in the confines of the right external iliac artery, nerve and pelvic side wall were carefully mobilized. Lymphostasis was achieved with cold clips.  This set aside, labeled the right external iliac lymph nodes.  The medial border of this was actually at the ureter, which was inspected following these maneuvers and found to be uninjured.  Right obturator lymph nodes were then carefully mobilized with the confines being right external iliac  vein, pelvic side wall, and obturator nerve.  These were set aside and labeled the right obturator lymph nodes.  The obturator nerve was inspected following these maneuvers and found to be uninjured.  The right perivesical sentinel lymph node was also similarly mobilized and set aside for permanent pathology, labeled the right perivesical lymph node area, sentinel.  Mirror image lymphadenectomy by template was performed on the left side, again of the left external iliac and left obturator groups.  Lymphostasis again being achieved with cold clips and the obturator nerve being inspected following these maneuvers and found to be uninjured.  Attention was then directed to the bladder neck dissection.  The bladder neck was identified by moving the Foley catheter back and forth and the lateral aspect of this was first addressed to try to help minimize the caliber of bladder neck and this was performed for approximately 70% of the base of the prostate-bladder neck junction.  Next, a traditional anterior approach was taken in the anterior to posterior direction separating the bladder neck from the base of the prostate.  There was significant asymmetry of the right lobe.  Bilateral ureteral orifices were inspected during these maneuvers and found to be away from the plane of dissection and were uninjured. Posterior dissection was performed by incising approximately 1 cm inferior to the posterior lip of the bladder neck and dissecting directly inferiorly and posteriorly.  The plane of Denonvilliers was  entered.  Bilateral vas deferens were found, dissected for distance of approximately 4 cm ligated and placed on gentle superior traction. Bilateral seminal vesicles were also dissected to their tips and placed on gentle superior traction.  The plane of Denonvilliers was further developed in a base-to-apex orientation.  This exposed the neurovascular pedicles of the prostate, which were quite large  given the size of The prostate.  It was felt that the vascular stapling technique would be most efficient.  As such, vascular load stapler was used in a base-to-apex orientation bilaterally x2, keeping the staple line very close to the prostate, thus performing partial nerve-sparing bilaterally.  Anterior dissection was performed in the base of the prostate on gentle superior traction, identifying the area of the membranous urethra, which was then coldly transected.  This completely freed up the radical prostatectomy specimen, this appeared to be much too large to place into a normal- sized EndoCatch bag and it was therefore set aside in the upper abdomen for later retrieval.  Digital rectal exam was performed using indicator glove and no evidence of rectal violation was seen.  Next, posterior resection was performed by running V-Loc suture between the posterior urethral plate and posterior bladder neck tissue, which brought the structures in the tension free apposition.  Bladder neck reconstruction was then performed by placing a 2-0 Vicryl figure-of-eight suture at the 3 o'clock and 9 o'clock positions at the bladder neck, which narrowed the caliber somewhat purposely to better approximate the caliber of the membranous urethral stump.  Next, mucosa-to-mucosa anastomosis was performed using double-armed V-Loc suture from the 6 o'clock to 12 o'clock positions and then anchoring the stitch to the puboprostatic ligaments, thus performing anterior reconstruction and stabilization. New Foley catheter was then easily placed and irrigated quantitatively without gross leakage.  Closed suction drain was brought through the previous left lateral most robotic port site near the peritoneal cavity. Robot was then undocked.  The specimen was retrieved by laparoscopic technique transitioning the camera to the previous right far lateral assistant port site and expanding the previous robotic camera port  site to the diameter of approximately 15 mm and placing the extra large EndoCatch bag through this and retrieving the radical prostatectomy specimen.  This specimen was completely retrieved by extending the site inferiorly for total distance of approximately 6 cm and removing the prostatectomy specimen, setting it aside for permanent pathology. Fascia was closed at the level of the assistant port site using Eulas Post- Thomason suture passer and 0 Vicryl and at the level the traction site using interrupted figure-of-eight PDS x6 followed by running Vicryl at [employer].  All incision sites were infiltrated with dilute lyophilized Marcaine and closed the level of the skin using subcuticular Monocryl followed by Dermabond.  Drain stitch was applied.  Procedure was then terminated.  The patient tolerated the procedure well.  There were no immediate periprocedural complications.  The patient was taken to the postanesthesia care unit in stable condition.          ______________________________ Alexis Frock, MD     TM/MEDQ  D:  04/09/2014  T:  04/10/2014  Job:  [id number]

## 2014-04-12 ENCOUNTER — Encounter (HOSPITAL_COMMUNITY): Payer: Self-pay | Admitting: Urology

## 2014-04-14 NOTE — Discharge Summary (Signed)
Pt discussed with on-call physician Dr. Risa Grill and felt to be adequate for discharge.

## 2014-04-22 ENCOUNTER — Encounter: Payer: Self-pay | Admitting: Cardiology

## 2014-04-27 ENCOUNTER — Encounter: Payer: Self-pay | Admitting: Radiation Oncology

## 2014-06-22 ENCOUNTER — Telehealth: Payer: Self-pay | Admitting: *Deleted

## 2014-06-22 NOTE — Telephone Encounter (Signed)
FAXED AND SIGNED  ORDER FOR 05/19/14 PT/INR ORDER (#[id number])

## 2015-04-24 ENCOUNTER — Other Ambulatory Visit: Payer: Self-pay | Admitting: Cardiology

## 2015-04-25 ENCOUNTER — Other Ambulatory Visit: Payer: Self-pay | Admitting: *Deleted

## 2015-04-25 MED ORDER — LOSARTAN POTASSIUM 25 MG PO TABS: 25.0000 mg | ORAL_TABLET | Freq: Every day | ORAL | Status: DC

## 2015-04-25 NOTE — Telephone Encounter (Signed)
REFILL 

## 2015-04-26 ENCOUNTER — Emergency Department (HOSPITAL_COMMUNITY): Payer: Medicare HMO

## 2015-04-26 ENCOUNTER — Encounter (HOSPITAL_COMMUNITY): Payer: Self-pay | Admitting: Emergency Medicine

## 2015-04-26 ENCOUNTER — Emergency Department (HOSPITAL_COMMUNITY)
Admission: EM | Admit: 2015-04-26 | Discharge: 2015-04-26 | Disposition: A | Discharge status: Home / Self Care | Payer: Medicare HMO | Attending: Emergency Medicine | Admitting: Emergency Medicine

## 2015-04-26 DIAGNOSIS — G8929 Other chronic pain: Secondary | ICD-10-CM | POA: Insufficient documentation

## 2015-04-26 DIAGNOSIS — Y9389 Activity, other specified: Secondary | ICD-10-CM | POA: Insufficient documentation

## 2015-04-26 DIAGNOSIS — S199XXA Unspecified injury of neck, initial encounter: Secondary | ICD-10-CM | POA: Diagnosis not present

## 2015-04-26 DIAGNOSIS — M545 Low back pain: Secondary | ICD-10-CM | POA: Diagnosis not present

## 2015-04-26 DIAGNOSIS — Z8546 Personal history of malignant neoplasm of prostate: Secondary | ICD-10-CM | POA: Diagnosis not present

## 2015-04-26 DIAGNOSIS — S0990XA Unspecified injury of head, initial encounter: Secondary | ICD-10-CM | POA: Insufficient documentation

## 2015-04-26 DIAGNOSIS — S3992XA Unspecified injury of lower back, initial encounter: Secondary | ICD-10-CM | POA: Insufficient documentation

## 2015-04-26 DIAGNOSIS — Y92410 Unspecified street and highway as the place of occurrence of the external cause: Secondary | ICD-10-CM | POA: Insufficient documentation

## 2015-04-26 DIAGNOSIS — R200 Anesthesia of skin: Secondary | ICD-10-CM | POA: Diagnosis not present

## 2015-04-26 DIAGNOSIS — Z87438 Personal history of other diseases of male genital organs: Secondary | ICD-10-CM | POA: Insufficient documentation

## 2015-04-26 DIAGNOSIS — M549 Dorsalgia, unspecified: Secondary | ICD-10-CM

## 2015-04-26 DIAGNOSIS — M542 Cervicalgia: Secondary | ICD-10-CM

## 2015-04-26 DIAGNOSIS — Y998 Other external cause status: Secondary | ICD-10-CM | POA: Insufficient documentation

## 2015-04-26 DIAGNOSIS — R42 Dizziness and giddiness: Secondary | ICD-10-CM | POA: Diagnosis not present

## 2015-04-26 DIAGNOSIS — I10 Essential (primary) hypertension: Secondary | ICD-10-CM | POA: Diagnosis not present

## 2015-04-26 DIAGNOSIS — T148 Other injury of unspecified body region: Secondary | ICD-10-CM | POA: Diagnosis not present

## 2015-04-26 DIAGNOSIS — Z79899 Other long term (current) drug therapy: Secondary | ICD-10-CM | POA: Insufficient documentation

## 2015-04-26 MED ORDER — METHOCARBAMOL 500 MG PO TABS: 500.0000 mg | ORAL_TABLET | Freq: Two times a day (BID) | ORAL | Status: DC

## 2015-04-26 MED ORDER — IBUPROFEN 800 MG PO TABS: 800.0000 mg | ORAL_TABLET | Freq: Three times a day (TID) | ORAL | Status: DC

## 2015-04-26 MED ORDER — OXYCODONE-ACETAMINOPHEN 5-325 MG PO TABS
2.0000 | ORAL_TABLET | Freq: Once | ORAL | Status: AC
  Administered 2015-04-26: 2 via ORAL
  Filled 2015-04-26: qty 2

## 2015-04-26 MED ORDER — OXYCODONE-ACETAMINOPHEN 5-325 MG PO TABS: 2.0000 | ORAL_TABLET | ORAL | Status: DC | PRN
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
## 2015-04-26 NOTE — ED Notes (Signed)
AVS explained in detail. Given L soft C-collar for home use. Knows to follow up with PCP and take all medications as prescribed. Ambulatory with steady gait.

## 2015-04-28 ENCOUNTER — Ambulatory Visit (INDEPENDENT_AMBULATORY_CARE_PROVIDER_SITE_OTHER): Payer: Medicare HMO | Admitting: Internal Medicine

## 2015-04-28 ENCOUNTER — Encounter: Payer: Self-pay | Admitting: Internal Medicine

## 2015-04-28 VITALS — BP 158/80 | HR 85 | Temp 97.9°F | Ht 71.0 in | Wt 243.5 lb

## 2015-04-28 DIAGNOSIS — I10 Essential (primary) hypertension: Secondary | ICD-10-CM

## 2015-04-28 DIAGNOSIS — Z1211 Encounter for screening for malignant neoplasm of colon: Secondary | ICD-10-CM

## 2015-04-28 DIAGNOSIS — M542 Cervicalgia: Secondary | ICD-10-CM | POA: Diagnosis not present

## 2015-04-28 DIAGNOSIS — M5441 Lumbago with sciatica, right side: Secondary | ICD-10-CM | POA: Diagnosis not present

## 2015-04-28 DIAGNOSIS — Z23 Encounter for immunization: Secondary | ICD-10-CM | POA: Diagnosis not present

## 2015-04-28 MED ORDER — HYDROCODONE-ACETAMINOPHEN 5-325 MG PO TABS: 1.0000 | ORAL_TABLET | Freq: Three times a day (TID) | ORAL | Status: DC | PRN
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
## 2015-04-28 NOTE — Progress Notes (Signed)
Pre visit review using our clinic review tool, if applicable. No additional management support is needed unless otherwise documented below in the visit note. 

## 2015-04-28 NOTE — Assessment & Plan Note (Signed)
Elevated here today - ? Uncontrolled or elevated related to pain He will start monitoring at home and call if it is persistently elevated Continue losartan 25 mg daily -- will increase if bp elevated Regular exercise and low sodium diet stressed F/u in 6 months, will get blood work at that time

## 2015-04-28 NOTE — Patient Instructions (Addendum)
  We have reviewed your prior records including labs and tests today.   All other Health Maintenance issues reviewed.   All recommended immunizations and age-appropriate screenings are up-to-date.  Flu vaccine administered today.   Medications reviewed and updated.  Changes include stopping percocet and taking vicodin for pain as needed.  Continue ibuprofen and robaxin (muscle relaxer)  A referral was ordered for GI for a colonoscopy and Dr. Rolena Infante for your back pain  Your blood pressure is high here today.  This may be related to being in pain.  Monitor your bp at home and call if it is elevated so we can adjust your medication.  Please schedule followup in 6 months for routine follow up and we will do blood work at that time.

## 2015-04-29 ENCOUNTER — Encounter: Payer: Self-pay | Admitting: Gastroenterology

## 2015-04-29 NOTE — Addendum Note (Signed)
Addended by: Lowella Dandy on: 04/29/2015 10:41 AM   Modules accepted: Orders

## 2015-05-02 ENCOUNTER — Encounter: Payer: Self-pay | Admitting: Internal Medicine

## 2015-05-03 NOTE — Telephone Encounter (Signed)
Please call him - we can try a short course of steroids to see if that helps -- this is a stronger anti-inflammatory than ibuprofen and it may help decrease the swelling in his back. Let me know if he agrees -  i will prescribe prednisone 40 mg daily for 4 days.  He should not take any ibuprofen/advil or aspirin when taking the prednisone.  He should take the prednisone with food.

## 2015-05-03 NOTE — Telephone Encounter (Signed)
Please advise 

## 2015-05-06 DIAGNOSIS — M5441 Lumbago with sciatica, right side: Secondary | ICD-10-CM | POA: Diagnosis not present

## 2015-05-06 DIAGNOSIS — S134XXA Sprain of ligaments of cervical spine, initial encounter: Secondary | ICD-10-CM | POA: Diagnosis not present

## 2015-05-06 DIAGNOSIS — S161XXA Strain of muscle, fascia and tendon at neck level, initial encounter: Secondary | ICD-10-CM | POA: Diagnosis not present

## 2015-05-06 DIAGNOSIS — S335XXA Sprain of ligaments of lumbar spine, initial encounter: Secondary | ICD-10-CM | POA: Diagnosis not present

## 2015-05-09 ENCOUNTER — Ambulatory Visit: Payer: Self-pay | Admitting: Family

## 2015-05-14 DIAGNOSIS — S335XXA Sprain of ligaments of lumbar spine, initial encounter: Secondary | ICD-10-CM | POA: Diagnosis not present

## 2015-05-24 DIAGNOSIS — S335XXD Sprain of ligaments of lumbar spine, subsequent encounter: Secondary | ICD-10-CM | POA: Diagnosis not present

## 2015-05-24 DIAGNOSIS — M5441 Lumbago with sciatica, right side: Secondary | ICD-10-CM | POA: Diagnosis not present

## 2015-05-24 DIAGNOSIS — S161XXD Strain of muscle, fascia and tendon at neck level, subsequent encounter: Secondary | ICD-10-CM | POA: Diagnosis not present

## 2015-06-13 ENCOUNTER — Ambulatory Visit (AMBULATORY_SURGERY_CENTER): Payer: Self-pay | Admitting: *Deleted

## 2015-06-13 VITALS — Ht 71.0 in | Wt 251.0 lb

## 2015-06-13 DIAGNOSIS — Z1211 Encounter for screening for malignant neoplasm of colon: Secondary | ICD-10-CM

## 2015-06-13 MED ORDER — NA SULFATE-K SULFATE-MG SULF 17.5-3.13-1.6 GM/177ML PO SOLN: 1.0000 | Freq: Once | ORAL | Status: DC

## 2015-06-13 NOTE — Progress Notes (Signed)
No egg or soy allergy No issues with past sedation No diet pills No home 02 use  emmi video to e mail       [email or web address]

## 2015-06-14 ENCOUNTER — Encounter: Payer: Self-pay | Admitting: Gastroenterology

## 2015-06-28 ENCOUNTER — Encounter: Payer: Self-pay | Admitting: Gastroenterology

## 2015-06-29 DIAGNOSIS — M5136 Other intervertebral disc degeneration, lumbar region: Secondary | ICD-10-CM | POA: Diagnosis not present

## 2015-07-04 DIAGNOSIS — M5136 Other intervertebral disc degeneration, lumbar region: Secondary | ICD-10-CM | POA: Diagnosis not present

## 2015-07-04 DIAGNOSIS — M5441 Lumbago with sciatica, right side: Secondary | ICD-10-CM | POA: Diagnosis not present

## 2015-07-06 ENCOUNTER — Other Ambulatory Visit: Payer: Self-pay | Admitting: Cardiology

## 2015-07-08 ENCOUNTER — Encounter: Payer: Self-pay | Admitting: Internal Medicine

## 2015-07-13 NOTE — Telephone Encounter (Signed)
Please advise 

## 2015-08-02 ENCOUNTER — Telehealth: Payer: Self-pay

## 2015-08-02 NOTE — Telephone Encounter (Signed)
Call to fup regarding AWV; Is 60 yo and may be first calendar year on Medicare; Will need to confirm before scheduling AWV

## 2015-08-05 ENCOUNTER — Other Ambulatory Visit: Payer: Self-pay | Admitting: Cardiology

## 2015-08-05 NOTE — Telephone Encounter (Signed)
Call to discuss AWV; LVM to call back and schedule and if this is his 1st year on medicare, then the doctor will perform that exam.

## 2015-08-09 DIAGNOSIS — M5136 Other intervertebral disc degeneration, lumbar region: Secondary | ICD-10-CM | POA: Diagnosis not present

## 2015-08-10 NOTE — Telephone Encounter (Signed)
Call back to schedule AWV for 2-8 at 9am;

## 2015-08-31 ENCOUNTER — Ambulatory Visit: Payer: Self-pay

## 2015-09-27 ENCOUNTER — Other Ambulatory Visit: Payer: Self-pay | Admitting: Cardiology

## 2015-09-27 NOTE — Telephone Encounter (Signed)
REFILL 

## 2015-10-31 ENCOUNTER — Telehealth: Payer: Self-pay | Admitting: Cardiology

## 2015-10-31 ENCOUNTER — Telehealth: Payer: Self-pay | Admitting: Internal Medicine

## 2015-10-31 ENCOUNTER — Other Ambulatory Visit: Payer: Self-pay | Admitting: Cardiology

## 2015-10-31 DIAGNOSIS — I10 Essential (primary) hypertension: Secondary | ICD-10-CM

## 2015-10-31 DIAGNOSIS — Z1159 Encounter for screening for other viral diseases: Secondary | ICD-10-CM

## 2015-10-31 NOTE — Telephone Encounter (Signed)
Hep c and cmp ordered - no need to fast

## 2015-10-31 NOTE — Telephone Encounter (Signed)
Spoke to [place] man- she states the information came from [employer]

## 2015-10-31 NOTE — Telephone Encounter (Signed)
Patient is requesting Hep C screening to be entered.  Please advise.

## 2015-10-31 NOTE — Telephone Encounter (Signed)
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  Please advise.

## 2015-10-31 NOTE — Telephone Encounter (Signed)
Please advise, do pt need other blood work do along with Hep C screening?

## 2015-11-01 MED ORDER — LOSARTAN POTASSIUM 25 MG PO TABS: 25.0000 mg | ORAL_TABLET | Freq: Every day | ORAL | Status: DC

## 2015-11-01 NOTE — Telephone Encounter (Signed)
LVM to inform pt.

## 2015-11-02 ENCOUNTER — Telehealth: Payer: Self-pay | Admitting: Cardiology

## 2015-11-02 NOTE — Telephone Encounter (Signed)
No answer. Left message to call back.   

## 2015-11-02 NOTE — Telephone Encounter (Signed)
Follow Up  ° °Pt returned the call  °

## 2015-11-04 MED ORDER — HYDROCHLOROTHIAZIDE 12.5 MG PO CAPS: 12.5000 mg | ORAL_CAPSULE | Freq: Every day | ORAL | Status: DC

## 2015-11-04 NOTE — Telephone Encounter (Signed)
Returned call to pt. He stated he is having swelling in his right ankle with some itching present.  He denies any pain or streaking. It is NOT warm to the touch. He said there is some discoloration of skin around his ankle. He says that he does not have any problem exercising and continues to swim and ride the stationary bike with no problems.  He has not been seen since 02/2014 and has an upcoming appt scheduled for 5/8 with Dr Ellyn Hack. Taken to Dr Claiborne Billings, DOD, for further evaluation.   Dr Claiborne Billings recommends starting HCTZ 12.5 mg po daily and to reassess at his upcoming appt with Dr Ellyn Hack.   Pt called back and made aware of recommendation. Verified pt pharmacy and Rx sent. Pt verbalized understanding and to call if any new or worsening s/s.

## 2015-11-21 ENCOUNTER — Encounter: Payer: Self-pay | Admitting: Internal Medicine

## 2015-11-28 ENCOUNTER — Ambulatory Visit: Payer: Self-pay | Admitting: Cardiology

## 2015-11-30 ENCOUNTER — Ambulatory Visit (INDEPENDENT_AMBULATORY_CARE_PROVIDER_SITE_OTHER): Payer: Medicare HMO

## 2015-11-30 DIAGNOSIS — Z23 Encounter for immunization: Secondary | ICD-10-CM

## 2015-12-01 ENCOUNTER — Encounter: Payer: Self-pay | Admitting: Internal Medicine

## 2015-12-01 ENCOUNTER — Telehealth: Payer: Self-pay | Admitting: Internal Medicine

## 2015-12-01 DIAGNOSIS — Z1159 Encounter for screening for other viral diseases: Secondary | ICD-10-CM

## 2015-12-01 NOTE — Telephone Encounter (Signed)
Patient is requesting HEP C screening test.  Please follow up with patient in regards.

## 2015-12-01 NOTE — Telephone Encounter (Signed)
LVM informing labs have already been ordering.

## 2015-12-02 ENCOUNTER — Other Ambulatory Visit (INDEPENDENT_AMBULATORY_CARE_PROVIDER_SITE_OTHER): Payer: Medicare HMO

## 2015-12-02 DIAGNOSIS — Z1159 Encounter for screening for other viral diseases: Secondary | ICD-10-CM | POA: Diagnosis not present

## 2015-12-02 DIAGNOSIS — I10 Essential (primary) hypertension: Secondary | ICD-10-CM | POA: Diagnosis not present

## 2015-12-02 DIAGNOSIS — R69 Illness, unspecified: Secondary | ICD-10-CM | POA: Diagnosis not present

## 2015-12-02 LAB — COMPREHENSIVE METABOLIC PANEL
ALT: 29 U/L (ref 0–53)
AST: 21 U/L (ref 0–37)
Albumin: 4.3 g/dL (ref 3.5–5.2)
Alkaline Phosphatase: 66 U/L (ref 39–117)
BUN: 12 mg/dL (ref 6–23)
CHLORIDE: 105 meq/L (ref 96–112)
CO2: 27 meq/L (ref 19–32)
Calcium: 10.6 mg/dL — ABNORMAL HIGH (ref 8.4–10.5)
Creatinine, Ser: 0.99 mg/dL (ref 0.40–1.50)
GFR: 99.68 mL/min (ref >60.00)
Glucose, Bld: 188 mg/dL — ABNORMAL HIGH (ref 70–99)
Potassium: 4 mEq/L (ref 3.5–5.1)
SODIUM: 139 meq/L (ref 135–145)
Total Bilirubin: 0.6 mg/dL (ref 0.2–1.2)
Total Protein: 7.1 g/dL (ref 6.0–8.3)

## 2015-12-03 ENCOUNTER — Encounter: Payer: Self-pay | Admitting: Internal Medicine

## 2015-12-03 LAB — HEPATITIS C ANTIBODY: HCV Ab: NEGATIVE

## 2015-12-06 ENCOUNTER — Telehealth: Payer: Self-pay | Admitting: Internal Medicine

## 2015-12-06 DIAGNOSIS — R7309 Other abnormal glucose: Secondary | ICD-10-CM

## 2015-12-06 NOTE — Telephone Encounter (Signed)
Please call patient back in regards to glucose on labs.

## 2015-12-07 NOTE — Telephone Encounter (Signed)
LVM for pt to call back.

## 2015-12-07 NOTE — Telephone Encounter (Signed)
Patient called back again about the labs. He said if you would call him back. He will be in class 9 to 11 he will be in a class. So anytime after that. Thank you.

## 2015-12-08 NOTE — Telephone Encounter (Signed)
Spoke with pt, he was not fasting during his labs. But does have diabetes in his family and would like to have a Hgb A1c lab done to determine if he is possibly diabetic.

## 2015-12-14 ENCOUNTER — Other Ambulatory Visit (INDEPENDENT_AMBULATORY_CARE_PROVIDER_SITE_OTHER): Payer: Medicare HMO

## 2015-12-14 DIAGNOSIS — R7309 Other abnormal glucose: Secondary | ICD-10-CM

## 2015-12-14 LAB — HEMOGLOBIN A1C: Hgb A1c MFr Bld: 8.5 % — ABNORMAL HIGH (ref 4.6–6.5)

## 2015-12-15 ENCOUNTER — Encounter: Payer: Self-pay | Admitting: Internal Medicine

## 2015-12-15 DIAGNOSIS — E119 Type 2 diabetes mellitus without complications: Secondary | ICD-10-CM | POA: Insufficient documentation

## 2015-12-15 DIAGNOSIS — IMO0002 Reserved for concepts with insufficient information to code with codable children: Secondary | ICD-10-CM | POA: Insufficient documentation

## 2015-12-20 ENCOUNTER — Telehealth: Payer: Self-pay | Admitting: Internal Medicine

## 2015-12-20 NOTE — Telephone Encounter (Signed)
Need ok for patient to have pneumococcal polysaccharide vac.

## 2015-12-21 DIAGNOSIS — Z23 Encounter for immunization: Secondary | ICD-10-CM | POA: Diagnosis not present

## 2015-12-21 NOTE — Telephone Encounter (Signed)
Please advise 

## 2015-12-21 NOTE — Telephone Encounter (Signed)
LVM informing pt

## 2015-12-21 NOTE — Telephone Encounter (Signed)
ok 

## 2015-12-27 ENCOUNTER — Ambulatory Visit: Payer: Self-pay | Admitting: Cardiology

## 2015-12-31 ENCOUNTER — Ambulatory Visit: Payer: Self-pay | Admitting: Family Medicine

## 2016-01-30 ENCOUNTER — Encounter: Payer: Self-pay | Admitting: Cardiology

## 2016-02-02 ENCOUNTER — Encounter: Payer: Self-pay | Admitting: Internal Medicine

## 2016-02-02 DIAGNOSIS — H9190 Unspecified hearing loss, unspecified ear: Secondary | ICD-10-CM

## 2016-02-02 NOTE — Telephone Encounter (Signed)
Referral pending -- just need to know what it is for - ? Hearing eval

## 2016-02-04 NOTE — Telephone Encounter (Signed)
ordered

## 2016-02-13 ENCOUNTER — Telehealth: Payer: Self-pay | Admitting: Emergency Medicine

## 2016-02-13 DIAGNOSIS — I10 Essential (primary) hypertension: Secondary | ICD-10-CM

## 2016-02-13 NOTE — Addendum Note (Signed)
Addended by: Terence Lux B on: 02/13/2016 04:48 PM   Modules accepted: Orders

## 2016-02-13 NOTE — Telephone Encounter (Signed)
Please advise on what labs need to ordered.

## 2016-02-13 NOTE — Telephone Encounter (Signed)
Spoke with pt to inform.  

## 2016-02-13 NOTE — Telephone Encounter (Signed)
Cmp, lipid, a1c, urine microalbumin

## 2016-02-13 NOTE — Telephone Encounter (Signed)
Pt moved his appointment from 8/16 to 8/2. Wants to go get his blood work done on tues 8/1. He asked if those orders could be put in. Please advise thanks.

## 2016-02-21 ENCOUNTER — Other Ambulatory Visit (INDEPENDENT_AMBULATORY_CARE_PROVIDER_SITE_OTHER): Payer: Medicare HMO

## 2016-02-21 DIAGNOSIS — I10 Essential (primary) hypertension: Secondary | ICD-10-CM | POA: Diagnosis not present

## 2016-02-21 LAB — COMPREHENSIVE METABOLIC PANEL
ALT: 21 U/L (ref 0–53)
AST: 18 U/L (ref 0–37)
Albumin: 4 g/dL (ref 3.5–5.2)
Alkaline Phosphatase: 55 U/L (ref 39–117)
BUN: 14 mg/dL (ref 6–23)
CO2: 29 meq/L (ref 19–32)
CREATININE: 1.04 mg/dL (ref 0.40–1.50)
Calcium: 10.2 mg/dL (ref 8.4–10.5)
Chloride: 106 mEq/L (ref 96–112)
GFR: 94.1 mL/min (ref >60.00)
GLUCOSE: 125 mg/dL — AB (ref 70–99)
Potassium: 3.8 mEq/L (ref 3.5–5.1)
SODIUM: 141 meq/L (ref 135–145)
Total Bilirubin: 0.5 mg/dL (ref 0.2–1.2)
Total Protein: 7 g/dL (ref 6.0–8.3)

## 2016-02-21 LAB — LIPID PANEL
CHOL/HDL RATIO: 3
Cholesterol: 144 mg/dL (ref 0–200)
HDL: 45.7 mg/dL (ref >39.00)
LDL Cholesterol: 83 mg/dL (ref 0–99)
NONHDL: 97.9
TRIGLYCERIDES: 77 mg/dL (ref 0.0–149.0)
VLDL: 15.4 mg/dL (ref 0.0–40.0)

## 2016-02-21 LAB — MICROALBUMIN / CREATININE URINE RATIO
CREATININE, U: 118.5 mg/dL
MICROALB/CREAT RATIO: 1.5 mg/g (ref 0.0–30.0)
Microalb, Ur: 1.8 mg/dL (ref 0.0–1.9)

## 2016-02-21 LAB — HEMOGLOBIN A1C: Hgb A1c MFr Bld: 7.1 % — ABNORMAL HIGH (ref 4.6–6.5)

## 2016-02-22 ENCOUNTER — Encounter: Payer: Self-pay | Admitting: Internal Medicine

## 2016-02-22 ENCOUNTER — Ambulatory Visit (INDEPENDENT_AMBULATORY_CARE_PROVIDER_SITE_OTHER): Payer: Medicare HMO | Admitting: Internal Medicine

## 2016-02-22 VITALS — BP 148/96 | HR 81 | Temp 98.4°F | Ht 71.0 in | Wt 248.0 lb

## 2016-02-22 DIAGNOSIS — E119 Type 2 diabetes mellitus without complications: Secondary | ICD-10-CM | POA: Diagnosis not present

## 2016-02-22 DIAGNOSIS — I10 Essential (primary) hypertension: Secondary | ICD-10-CM | POA: Diagnosis not present

## 2016-02-22 MED ORDER — HYDROCHLOROTHIAZIDE 25 MG PO TABS: 25.0000 mg | ORAL_TABLET | Freq: Every day | ORAL | 3 refills | Status: DC

## 2016-02-22 NOTE — Patient Instructions (Addendum)
Your blood pressure goal is less than 140/90.   Test(s) ordered today. Have blood work done one week prior to your next appointment.   Medications reviewed and updated.  Changes include starting hydrochlorothiazide 25 mg daily.  Stop all other medications.   Your prescription(s) have been submitted to your pharmacy. Please take as directed and contact our office if you believe you are having problem(s) with the medication(s).   Please followup in 6 months

## 2016-02-22 NOTE — Progress Notes (Signed)
Pre visit review using our clinic review tool, if applicable. No additional management support is needed unless otherwise documented below in the visit note. 

## 2016-02-22 NOTE — Assessment & Plan Note (Signed)
New diagnosis a1c improved with lifestyle changes Will hold off on medication Continue low sugar/carb diet and regular exercise Advised self foot exams daily and yearly eye exam Follow up in 6 months, sooner if needed

## 2016-02-22 NOTE — Progress Notes (Signed)
Subjective:    Patient ID: Derrick Walker, male    DOB: 12-09-1955, 60 y.o.   MRN: [id number]  HPI He is here for follow up.  Diabetes: He controlling his sugars with lifestyle and has made lifestyle changes since he was here last. He is compliant with a diabetic diet. He is exercising regularly. Marland Kitchen He checks his feet daily and denies foot lesions. He is not up-to-date with an ophthalmology examination.   Hypertension: He is not taking his medication daily. The losartan caused leg swelling and his kidneys started to hurt so he stopped the medication.  He has run out of the hctz, but thinks he needs something stronger.  He is compliant with a low sodium diet.  He denies chest pain, palpitations, shortness of breath and regular headaches. He is exercising regularly.  He does not monitor his blood pressure at home.     Medications and allergies reviewed with patient and updated if appropriate.  Patient Active Problem List   Diagnosis Date Noted  . Diabetes ([place]) 12/15/2015  . Malignant neoplasm of prostate ([place]) 04/09/2014  . Abnormal finding on EKG 09/17/2013  . LVH (left ventricular hypertrophy) - an EKG 09/17/2013  . Hypertension, essential   . Mild obesity     Current Outpatient Prescriptions on File Prior to Visit  Medication Sig Dispense Refill  . hydrochlorothiazide (MICROZIDE) 12.5 MG capsule Take 1 capsule (12.5 mg total) by mouth daily. (Patient not taking: Reported on 02/22/2016) 30 capsule 3  . HYDROcodone-acetaminophen (NORCO) 5-325 MG tablet Take 1-2 tablets by mouth every 8 (eight) hours as needed. (Patient not taking: Reported on 02/22/2016) 15 tablet 0  . ibuprofen (ADVIL,MOTRIN) 800 MG tablet Take 1 tablet (800 mg total) by mouth 3 (three) times daily. (Patient not taking: Reported on 06/13/2015) 21 tablet 0  . losartan (COZAAR) 25 MG tablet Take 1 tablet (25 mg total) by mouth daily. Keep appointment. (Patient not taking: Reported on 02/22/2016) 30 tablet 0  . Na  Sulfate-K Sulfate-Mg Sulf (SUPREP BOWEL PREP) SOLN Take 1 kit by mouth once. suprep as directed. No substitutions (Patient not taking: Reported on 02/22/2016) 354 mL 0  . traMADol (ULTRAM) 50 MG tablet Take 50 mg by mouth every 6 (six) hours as needed.     No current facility-administered medications on file prior to visit.     Past Medical History:  Diagnosis Date  . Elevated PSA   . Erectile dysfunction   . Hypertension, essential    on meds- BP stable  . Nocturia   . Prostate cancer ([place]) 12/11/13   Gleason 6, volume 152.41 cc (TRUS 152gm) ; in RMM, RA, LLB, LMB; Bone scan with no distant disease.    Past Surgical History:  Procedure Laterality Date  . LYMPHADENECTOMY Bilateral 04/09/2014   Procedure: BILATERAL PELVIC LYMPH NODE DISSECTION;  Surgeon: Alexis Frock, MD;  Location: [facility];  Service: Urology;  Laterality: Bilateral;  . PROSTATE BIOPSY N/A 12/11/2013   Procedure: BIOPSY TRANSRECTAL ULTRASONIC PROSTATE (TUBP);  Surgeon: Arvil Persons, MD;  Location: [facility];  Service: Urology;  Laterality: N/A;  . ROBOT ASSISTED LAPAROSCOPIC RADICAL PROSTATECTOMY N/A 04/09/2014   Procedure: ROBOTIC ASSISTED LAPAROSCOPIC RADICAL PROSTATECTOMY, INDOCYANINE GREEN DYE ;  Surgeon: Alexis Frock, MD;  Location: [facility];  Service: Urology;  Laterality: N/A;  . TRANSTHORACIC ECHOCARDIOGRAM  10-14-2013   dr Shanon Brow harding   mild LVH/  ef  63-01%/  grade I diastolic dysfunction/  mild LAE  Social History   Social History  . Marital status: Single    Spouse name: N/A  . Number of children: 3  . Years of education: N/A   Occupational History  .  [employer]   Social History Main Topics  . Smoking status: Never Smoker  . Smokeless tobacco: Never Used  . Alcohol use No  . Drug use: No  . Sexual activity: Not Asked   Other Topics Concern  . None   Social History Narrative   He is a divorced father of 3. 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 He has finished his Masters is now working on his PhD. He never smoked. He drinks about 3 glasses of wine with food a week. He uses to help digest food.   He is Jewish and into [employer] does not eat any pork/ham and only a small amount of beef.   Contact information:   Son: Steed Kanaan: [phone]   Daughter: Jamarea Selner: [phone]    Family History  Problem Relation Age of Onset  . Hypertension Mother   . Hypertension Brother   . Hypertension Maternal Grandmother   . Hypertension Paternal Grandmother   . Kidney failure Father   . Colon cancer Neg Hx   . Colon polyps Neg Hx   . Esophageal cancer Neg Hx   . Rectal cancer Neg Hx   . Stomach cancer Neg Hx     Review of Systems  Constitutional: Negative for fever.  Respiratory: Positive for cough (allergies). Negative for shortness of breath and wheezing.   Cardiovascular: Positive for leg swelling (mild). Negative for chest pain and palpitations.  Neurological: Negative for light-headedness, numbness and headaches.       Objective:   Vitals:   02/22/16 1105  BP: (!) 148/96  Pulse: 81  Temp: 98.4 F (36.9 C)   Filed Weights   02/22/16 1105  Weight: 248 lb (112.5 kg)   Body mass index is 34.59 kg/m.   Physical Exam Constitutional: Appears well-developed and well-nourished. No distress.  HENT:  Head: Normocephalic and atraumatic.  Neck: Neck supple. No tracheal deviation present. No thyromegaly present.  Cardiovascular: Normal rate, regular rhythm and normal heart sounds.   No murmur heard. No carotid bruit  Pulmonary/Chest: Effort normal and breath sounds normal. No respiratory distress. No has no wheezes. No rales.  Musculoskeletal: No edema.  Lymphadenopathy: No cervical adenopathy.  Skin: Skin is warm and dry. Not diaphoretic.  Psychiatric: Normal mood and affect. Behavior is normal.         Assessment & Plan:    See Problem List for  Assessment and Plan of chronic medical problems.   F/u in 6 months

## 2016-02-22 NOTE — Assessment & Plan Note (Signed)
BP high here today, but did not take his medication Did not tolerate losartan - will d/c Increase hctz to 25 mg daily Advised him to monitor BP at home cmp just done F/u in 6 months, sooner if BP is not at goal

## 2016-03-01 ENCOUNTER — Encounter: Payer: Self-pay | Admitting: Internal Medicine

## 2016-03-07 ENCOUNTER — Ambulatory Visit: Payer: Self-pay | Admitting: Internal Medicine

## 2016-03-08 ENCOUNTER — Ambulatory Visit: Payer: Self-pay | Admitting: Cardiology

## 2016-04-16 DIAGNOSIS — H01001 Unspecified blepharitis right upper eyelid: Secondary | ICD-10-CM | POA: Diagnosis not present

## 2016-04-16 DIAGNOSIS — H40052 Ocular hypertension, left eye: Secondary | ICD-10-CM | POA: Diagnosis not present

## 2016-04-16 DIAGNOSIS — H40051 Ocular hypertension, right eye: Secondary | ICD-10-CM | POA: Diagnosis not present

## 2016-04-16 DIAGNOSIS — H524 Presbyopia: Secondary | ICD-10-CM | POA: Diagnosis not present

## 2016-04-16 LAB — HM DIABETES EYE EXAM

## 2016-04-18 ENCOUNTER — Encounter: Payer: Self-pay | Admitting: Internal Medicine

## 2016-05-03 DIAGNOSIS — Z23 Encounter for immunization: Secondary | ICD-10-CM | POA: Diagnosis not present

## 2016-07-26 DIAGNOSIS — C61 Malignant neoplasm of prostate: Secondary | ICD-10-CM | POA: Diagnosis not present

## 2016-07-26 DIAGNOSIS — N5231 Erectile dysfunction following radical prostatectomy: Secondary | ICD-10-CM | POA: Diagnosis not present

## 2016-07-27 DIAGNOSIS — R7309 Other abnormal glucose: Secondary | ICD-10-CM | POA: Diagnosis not present

## 2016-07-27 LAB — HEMOGLOBIN A1C: HEMOGLOBIN A1C: 7.9

## 2016-08-07 ENCOUNTER — Encounter: Payer: Self-pay | Admitting: Geriatric Medicine

## 2016-08-07 ENCOUNTER — Encounter: Payer: Self-pay | Admitting: Internal Medicine

## 2016-08-07 ENCOUNTER — Ambulatory Visit: Payer: Self-pay | Admitting: Geriatric Medicine

## 2016-08-07 ENCOUNTER — Ambulatory Visit (INDEPENDENT_AMBULATORY_CARE_PROVIDER_SITE_OTHER): Payer: Medicare HMO | Admitting: Internal Medicine

## 2016-08-07 VITALS — BP 154/82 | HR 89 | Temp 98.4°F | Resp 16 | Ht 71.0 in | Wt 251.0 lb

## 2016-08-07 DIAGNOSIS — I10 Essential (primary) hypertension: Secondary | ICD-10-CM | POA: Diagnosis not present

## 2016-08-07 DIAGNOSIS — E669 Obesity, unspecified: Secondary | ICD-10-CM | POA: Diagnosis not present

## 2016-08-07 DIAGNOSIS — Z1211 Encounter for screening for malignant neoplasm of colon: Secondary | ICD-10-CM

## 2016-08-07 DIAGNOSIS — E119 Type 2 diabetes mellitus without complications: Secondary | ICD-10-CM

## 2016-08-07 MED ORDER — METFORMIN HCL 500 MG PO TABS: 500.0000 mg | ORAL_TABLET | Freq: Two times a day (BID) | ORAL | 3 refills | Status: DC

## 2016-08-07 NOTE — Progress Notes (Unsigned)
Abstracted and sent to scan. Resulted by [facility].  Opened [employer] Encounter instead of a "documention" encounter to enter the abstracted results.

## 2016-08-07 NOTE — Progress Notes (Signed)
Subjective:    Patient ID: Derrick Walker, male    DOB: [birth year], 61 y.o.   MRN: [id number]  HPI The patient is here for follow up.  Diabetes:  His a1c was tested by urology recently and it was 7.9%.  He is not on any medication. He is compliant with a diabetic diet. He is exercising regularly - 5 days a week. He does not monitor his sugars.  He checks his feet daily and denies foot lesions. He is up-to-date with an ophthalmology examination.   Hypertension: He is taking his medication daily. He is compliant with a low sodium diet.  He denies chest pain, palpitations, edema, shortness of breath and regular headaches. He is exercising regularly - goes to gym 5 days a week.  He does monitor his blood pressure or pulse - there is some confusion over what exactly he is measuring.  He will get a cuff for home and monitor his BP regularly.       Medications and allergies reviewed with patient and updated if appropriate.  Patient Active Problem List   Diagnosis Date Noted  . Diabetes ([place]) 12/15/2015  . Malignant neoplasm of prostate ([place]) 04/09/2014  . Abnormal finding on EKG 09/17/2013  . LVH (left ventricular hypertrophy) - an EKG 09/17/2013  . Hypertension, essential   . Mild obesity     Current Outpatient Prescriptions on File Prior to Visit  Medication Sig Dispense Refill  . hydrochlorothiazide (HYDRODIURIL) 25 MG tablet Take 1 tablet (25 mg total) by mouth daily. 90 tablet 3   No current facility-administered medications on file prior to visit.     Past Medical History:  Diagnosis Date  . Elevated PSA   . Erectile dysfunction   . Hypertension, essential    on meds- BP stable  . Nocturia   . Prostate cancer ([place]) 12/11/13   Gleason 6, volume 152.41 cc (TRUS 152gm) ; in RMM, RA, LLB, LMB; Bone scan with no distant disease.    Past Surgical History:  Procedure Laterality Date  . LYMPHADENECTOMY Bilateral 04/09/2014   Procedure: BILATERAL PELVIC LYMPH NODE DISSECTION;   Surgeon: Alexis Frock, MD;  Location: [facility];  Service: Urology;  Laterality: Bilateral;  . PROSTATE BIOPSY N/A 12/11/2013   Procedure: BIOPSY TRANSRECTAL ULTRASONIC PROSTATE (TUBP);  Surgeon: Arvil Persons, MD;  Location: [facility];  Service: Urology;  Laterality: N/A;  . ROBOT ASSISTED LAPAROSCOPIC RADICAL PROSTATECTOMY N/A 04/09/2014   Procedure: ROBOTIC ASSISTED LAPAROSCOPIC RADICAL PROSTATECTOMY, INDOCYANINE GREEN DYE ;  Surgeon: Alexis Frock, MD;  Location: [facility];  Service: Urology;  Laterality: N/A;  . TRANSTHORACIC ECHOCARDIOGRAM  10-14-2013   dr Shanon Brow harding   mild LVH/  ef  [id number]  grade I diastolic dysfunction/  mild LAE    Social History   Social History  . Marital status: Single    Spouse name: N/A  . Number of children: 3  . Years of education: N/A   Occupational History  .  [employer]   Social History Main Topics  . Smoking status: Never Smoker  . Smokeless tobacco: Never Used  . Alcohol use No  . Drug use: No  . Sexual activity: Not on file   Other Topics Concern  . Not on file   Social History Narrative   He is a divorced father of 3. 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 He has finished his Masters is now  working on his PhD. He never smoked. He drinks about 3 glasses of wine with food a week. He uses to help digest food.   He is Jewish and into [employer] does not eat any pork/ham and only a small amount of beef.   Contact information:   Son: Hendrick Schielke: [id number]   Daughter: Kimo Wallo: [phone]    Family History  Problem Relation Age of Onset  . Hypertension Mother   . Hypertension Brother   . Hypertension Maternal Grandmother   . Hypertension Paternal Grandmother   . Kidney failure Father   . Colon cancer Neg Hx   . Colon polyps Neg Hx   . Esophageal cancer Neg Hx   . Rectal cancer Neg Hx   . Stomach cancer Neg Hx     Review of  Systems  Constitutional: Negative for fever.  Respiratory: Negative for cough, shortness of breath and wheezing.   Cardiovascular: Positive for leg swelling. Negative for chest pain and palpitations.  Neurological: Negative for light-headedness and headaches.       Objective:   Vitals:   08/07/16 1355  BP: (!) 154/82  Pulse: 89  Resp: 16  Temp: 98.4 F (36.9 C)   Wt Readings from Last 3 Encounters:  08/07/16 251 lb (113.9 kg)  02/22/16 248 lb (112.5 kg)  06/13/15 251 lb (113.9 kg)   Body mass index is 35.01 kg/m.   Physical Exam    Constitutional: Appears well-developed and well-nourished. No distress.  HENT:  Head: Normocephalic and atraumatic.  Neck: Neck supple. No tracheal deviation present. No thyromegaly present.  No cervical lymphadenopathy Cardiovascular: Normal rate, regular rhythm and normal heart sounds.   No murmur heard. No carotid bruit .  Trace b/l LE edema Pulmonary/Chest: Effort normal and breath sounds normal. No respiratory distress. No has no wheezes. No rales.  Skin: Skin is warm and dry. Not diaphoretic.  Psychiatric: Normal mood and affect. Behavior is normal.      Assessment & Plan:   Referred to GI for a colonoscopy  See Problem List for Assessment and Plan of chronic medical problems.

## 2016-08-07 NOTE — Progress Notes (Signed)
Pre visit review using our clinic review tool, if applicable. No additional management support is needed unless otherwise documented below in the visit note. 

## 2016-08-07 NOTE — Assessment & Plan Note (Signed)
Decrease portions  Continue regular exercise

## 2016-08-07 NOTE — Patient Instructions (Addendum)
You can have your blood work done just prior to your appointment in two months if you want or the day of your appointment.   Test(s) ordered today.    Medications reviewed and updated.  Changes include starting metformin 500 mg twice daily. We can increase this if you tolerate it well.   Your prescription(s) have been submitted to your pharmacy. Please take as directed and contact our office if you believe you are having problem(s) with the medication(s).  A referral was ordered for gastroenterology.  Please followup in 2 months

## 2016-08-07 NOTE — Assessment & Plan Note (Signed)
Lab Results  Component Value Date   HGBA1C 7.9 07/27/2016   Start metformin 500 mg twice daily If tolerated will increase to 1000 mg twice daily Continue low sugar / carb diet - check labels Continue regular exercise Work on weight loss - decrease portions

## 2016-08-07 NOTE — Assessment & Plan Note (Signed)
BP elevated - ? Control He will start monitoring at home - keep a log FU in 2 months, sooner if needed Continue hctz 25 mg daily cmp 2 months ago

## 2016-08-08 ENCOUNTER — Telehealth: Payer: Self-pay | Admitting: Internal Medicine

## 2016-08-08 ENCOUNTER — Encounter: Payer: Self-pay | Admitting: Internal Medicine

## 2016-08-08 NOTE — Telephone Encounter (Signed)
Patient requesting referral for routine colonoscopy.

## 2016-08-10 NOTE — Telephone Encounter (Signed)
This was ordered at his last visit.

## 2016-08-10 NOTE — Telephone Encounter (Signed)
Pt informed GI will contact him directly.

## 2016-08-19 ENCOUNTER — Encounter: Payer: Self-pay | Admitting: Internal Medicine

## 2016-08-23 NOTE — Patient Instructions (Addendum)
  Test(s) ordered today. Your results will be released to [place] (or called to you) after review, usually within 72hours after test completion. If any changes need to be made, you will be notified at that same time.   Medications reviewed and updated.  Changes include increasing metformin to 1000 mg twice daily.  An epi-pen was also prescribed.   Your prescription(s) have been submitted to your pharmacy. Please take as directed and contact our office if you believe you are having problem(s) with the medication(s).    Please followup in 6 months

## 2016-08-23 NOTE — Progress Notes (Signed)
Subjective:    Patient ID: Derrick Walker, male    DOB: [birth year], 61 y.o.   MRN: [id number]  HPI The patient is here for follow up.  Diabetes: we started him on metformin one month ago.  He denies side effects. He is taking his medication daily as prescribed. He is compliant with a diabetic diet. He is exercising regularly.   Hypertension: He is taking his medication daily. He is compliant with a low sodium diet.  He denies chest pain, palpitations, edema, shortness of breath and regular headaches. He is exercising regularly.  He does monitor his blood pressure at home - 127/? At home.     Medications and allergies reviewed with patient and updated if appropriate.  Patient Active Problem List   Diagnosis Date Noted  . Diabetes ([place]) 12/15/2015  . Malignant neoplasm of prostate ([place]) 04/09/2014  . Abnormal finding on EKG 09/17/2013  . LVH (left ventricular hypertrophy) - an EKG 09/17/2013  . Hypertension, essential   . Mild obesity     Current Outpatient Prescriptions on File Prior to Visit  Medication Sig Dispense Refill  . hydrochlorothiazide (HYDRODIURIL) 25 MG tablet Take 1 tablet (25 mg total) by mouth daily. 90 tablet 3  . metFORMIN (GLUCOPHAGE) 500 MG tablet Take 1 tablet (500 mg total) by mouth 2 (two) times daily with a meal. 180 tablet 3   No current facility-administered medications on file prior to visit.     Past Medical History:  Diagnosis Date  . Elevated PSA   . Erectile dysfunction   . Hypertension, essential    on meds- BP stable  . Nocturia   . Prostate cancer ([place]) 12/11/13   Gleason 6, volume 152.41 cc (TRUS 152gm) ; in RMM, RA, LLB, LMB; Bone scan with no distant disease.    Past Surgical History:  Procedure Laterality Date  . LYMPHADENECTOMY Bilateral 04/09/2014   Procedure: BILATERAL PELVIC LYMPH NODE DISSECTION;  Surgeon: Alexis Frock, MD;  Location: [facility];  Service: Urology;  Laterality: Bilateral;  . PROSTATE BIOPSY N/A 12/11/2013   Procedure: BIOPSY TRANSRECTAL ULTRASONIC PROSTATE (TUBP);  Surgeon: Arvil Persons, MD;  Location: [facility];  Service: Urology;  Laterality: N/A;  . ROBOT ASSISTED LAPAROSCOPIC RADICAL PROSTATECTOMY N/A 04/09/2014   Procedure: ROBOTIC ASSISTED LAPAROSCOPIC RADICAL PROSTATECTOMY, INDOCYANINE GREEN DYE ;  Surgeon: Alexis Frock, MD;  Location: [facility];  Service: Urology;  Laterality: N/A;  . TRANSTHORACIC ECHOCARDIOGRAM  10-14-2013   dr Shanon Brow harding   mild LVH/  ef  [id number]  grade I diastolic dysfunction/  mild LAE    Social History   Social History  . Marital status: Single    Spouse name: N/A  . Number of children: 3  . Years of education: N/A   Occupational History  .  [employer]   Social History Main Topics  . Smoking status: Never Smoker  . Smokeless tobacco: Never Used  . Alcohol use No  . Drug use: No  . Sexual activity: Not Asked   Other Topics Concern  . None   Social History Narrative   He is a divorced father of 3. 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 He has finished his Masters is now working on his PhD. He never smoked. He drinks about 3 glasses of wine with food a week. He uses to help digest food.   He is Jewish and into [employer] does not  eat any pork/ham and only a small amount of beef.   Contact information:   Son: Tyland Reagle: [id number]   Daughter: Casanova Montford: [phone]    Family History  Problem Relation Age of Onset  . Hypertension Mother   . Hypertension Brother   . Hypertension Maternal Grandmother   . Hypertension Paternal Grandmother   . Kidney failure Father   . Colon cancer Neg Hx   . Colon polyps Neg Hx   . Esophageal cancer Neg Hx   . Rectal cancer Neg Hx   . Stomach cancer Neg Hx     Review of Systems  Constitutional: Negative for fever.  Respiratory: Negative for cough, shortness of breath and wheezing.   Cardiovascular:  Positive for leg swelling (improved). Negative for chest pain and palpitations.  Neurological: Negative for light-headedness and headaches.       Objective:   Vitals:   08/24/16 0842  BP: 134/88  Pulse: 83  Resp: 16  Temp: 98.3 F (36.8 C)   Wt Readings from Last 3 Encounters:  08/24/16 254 lb (115.2 kg)  08/07/16 251 lb (113.9 kg)  02/22/16 248 lb (112.5 kg)   Body mass index is 35.43 kg/m.   Physical Exam    Constitutional: Appears well-developed and well-nourished. No distress.  HENT:  Head: Normocephalic and atraumatic.  Neck: Neck supple. No tracheal deviation present. No thyromegaly present.  No cervical lymphadenopathy Cardiovascular: Normal rate, regular rhythm and normal heart sounds.   No murmur heard. No carotid bruit .  trace edema Pulmonary/Chest: Effort normal and breath sounds normal. No respiratory distress. No has no wheezes. No rales.  Skin: Skin is warm and dry. Not diaphoretic.  Psychiatric: Normal mood and affect. Behavior is normal.      Assessment & Plan:    See Problem List for Assessment and Plan of chronic medical problems.   FU in 6 months

## 2016-08-24 ENCOUNTER — Other Ambulatory Visit (INDEPENDENT_AMBULATORY_CARE_PROVIDER_SITE_OTHER): Payer: Medicare HMO

## 2016-08-24 ENCOUNTER — Ambulatory Visit (INDEPENDENT_AMBULATORY_CARE_PROVIDER_SITE_OTHER): Payer: Medicare HMO | Admitting: Internal Medicine

## 2016-08-24 ENCOUNTER — Encounter: Payer: Self-pay | Admitting: Internal Medicine

## 2016-08-24 VITALS — BP 134/88 | HR 83 | Temp 98.3°F | Resp 16 | Wt 254.0 lb

## 2016-08-24 DIAGNOSIS — E119 Type 2 diabetes mellitus without complications: Secondary | ICD-10-CM

## 2016-08-24 DIAGNOSIS — I10 Essential (primary) hypertension: Secondary | ICD-10-CM | POA: Diagnosis not present

## 2016-08-24 LAB — COMPREHENSIVE METABOLIC PANEL
ALBUMIN: 4.2 g/dL (ref 3.5–5.2)
ALK PHOS: 50 U/L (ref 39–117)
ALT: 17 U/L (ref 0–53)
AST: 14 U/L (ref 0–37)
BILIRUBIN TOTAL: 0.5 mg/dL (ref 0.2–1.2)
BUN: 12 mg/dL (ref 6–23)
CO2: 30 mEq/L (ref 19–32)
Calcium: 10.8 mg/dL — ABNORMAL HIGH (ref 8.4–10.5)
Chloride: 105 mEq/L (ref 96–112)
Creatinine, Ser: 1.04 mg/dL (ref 0.40–1.50)
GFR: 93.94 mL/min (ref >60.00)
GLUCOSE: 138 mg/dL — AB (ref 70–99)
POTASSIUM: 4.1 meq/L (ref 3.5–5.1)
Sodium: 140 mEq/L (ref 135–145)
TOTAL PROTEIN: 7.2 g/dL (ref 6.0–8.3)

## 2016-08-24 LAB — HEMOGLOBIN A1C: HEMOGLOBIN A1C: 8.1 % — AB (ref 4.6–6.5)

## 2016-08-24 MED ORDER — EPINEPHRINE 0.3 MG/0.3ML IJ SOAJ: 0.3000 mg | Freq: Once | INTRAMUSCULAR | 5 refills | Status: DC

## 2016-08-24 MED ORDER — METFORMIN HCL 1000 MG PO TABS: 1000.0000 mg | ORAL_TABLET | Freq: Two times a day (BID) | ORAL | 3 refills | Status: DC

## 2016-08-24 NOTE — Assessment & Plan Note (Signed)
BP controlled at home, better here - borderline high Current regimen effective and well tolerated Continue current medications at current doses Continue regular exercise and weight loss efforts cmp

## 2016-08-24 NOTE — Assessment & Plan Note (Signed)
Tolerating metformin well Increase metformin to 1000 mg BID Check a1c - need it to be ideally 4-6% for urological surgery If a1c > 6% today will recheck in one month Continue diabetic diet and regular exercise Working on weight loss

## 2016-08-25 ENCOUNTER — Encounter: Payer: Self-pay | Admitting: Internal Medicine

## 2016-08-28 ENCOUNTER — Encounter: Payer: Self-pay | Admitting: Internal Medicine

## 2016-08-28 MED ORDER — CANAGLIFLOZIN 100 MG PO TABS: 100.0000 mg | ORAL_TABLET | Freq: Every day | ORAL | 5 refills | Status: DC

## 2016-09-07 ENCOUNTER — Encounter: Payer: Self-pay | Admitting: Internal Medicine

## 2016-09-20 ENCOUNTER — Encounter: Payer: Self-pay | Admitting: Internal Medicine

## 2016-09-22 ENCOUNTER — Encounter: Payer: Self-pay | Admitting: Internal Medicine

## 2016-10-05 ENCOUNTER — Encounter: Payer: Self-pay | Admitting: Internal Medicine

## 2016-10-05 ENCOUNTER — Other Ambulatory Visit (INDEPENDENT_AMBULATORY_CARE_PROVIDER_SITE_OTHER): Payer: Medicare HMO

## 2016-10-05 ENCOUNTER — Ambulatory Visit (INDEPENDENT_AMBULATORY_CARE_PROVIDER_SITE_OTHER): Payer: Medicare HMO | Admitting: Internal Medicine

## 2016-10-05 VITALS — BP 128/88 | HR 78 | Temp 98.1°F | Ht 71.0 in | Wt 245.5 lb

## 2016-10-05 DIAGNOSIS — E119 Type 2 diabetes mellitus without complications: Secondary | ICD-10-CM

## 2016-10-05 DIAGNOSIS — I10 Essential (primary) hypertension: Secondary | ICD-10-CM | POA: Diagnosis not present

## 2016-10-05 DIAGNOSIS — R69 Illness, unspecified: Secondary | ICD-10-CM | POA: Diagnosis not present

## 2016-10-05 LAB — HEMOGLOBIN A1C: HEMOGLOBIN A1C: 7.2 % — AB (ref 4.6–6.5)

## 2016-10-05 MED ORDER — METFORMIN HCL 1000 MG PO TABS: 1000.0000 mg | ORAL_TABLET | Freq: Two times a day (BID) | ORAL | 3 refills | Status: DC

## 2016-10-05 MED ORDER — BLOOD GLUCOSE MONITOR KIT: PACK | 0 refills | Status: DC

## 2016-10-05 NOTE — Assessment & Plan Note (Addendum)
Check a1c Tolerating invokana and metformin continue current medication continue weight loss efforts

## 2016-10-05 NOTE — Progress Notes (Signed)
Pre visit review using our clinic review tool, if applicable. No additional management support is needed unless otherwise documented below in the visit note. 

## 2016-10-05 NOTE — Progress Notes (Signed)
Subjective:    Patient ID: Derrick Walker, male    DOB: 1956/03/30, 61 y.o.   MRN: [id number]  HPI The patient is here for follow up.  He wants to go forth with urologic surgery -Dr Nicolette Bang.     Diabetes: He is taking his medication daily as prescribed. He ran out of the metformin yesterday.  He is compliant with a diabetic diet. He is exercising regularly. He has lost some weight.  He is up-to-date with an ophthalmology examination.   Hypertension: He is taking his medication daily. He is compliant with a low sodium diet.  He denies chest pain, palpitations, edema, shortness of breath and regular headaches. He is exercising regularly.      Medications and allergies reviewed with patient and updated if appropriate.  Patient Active Problem List   Diagnosis Date Noted  . Diabetes ([place]) 12/15/2015  . Malignant neoplasm of prostate ([place]) 04/09/2014  . Abnormal finding on EKG 09/17/2013  . LVH (left ventricular hypertrophy) - an EKG 09/17/2013  . Hypertension, essential   . Mild obesity     Current Outpatient Prescriptions on File Prior to Visit  Medication Sig Dispense Refill  . canagliflozin (INVOKANA) 100 MG TABS tablet Take 1 tablet (100 mg total) by mouth daily before breakfast. 30 tablet 5  . hydrochlorothiazide (HYDRODIURIL) 25 MG tablet Take 1 tablet (25 mg total) by mouth daily. 90 tablet 3  . metFORMIN (GLUCOPHAGE) 1000 MG tablet Take 1 tablet (1,000 mg total) by mouth 2 (two) times daily with a meal. 180 tablet 3   No current facility-administered medications on file prior to visit.     Past Medical History:  Diagnosis Date  . Elevated PSA   . Erectile dysfunction   . Hypertension, essential    on meds- BP stable  . Nocturia   . Prostate cancer ([place]) 12/11/13   Gleason 6, volume 152.41 cc (TRUS 152gm) ; in RMM, RA, LLB, LMB; Bone scan with no distant disease.    Past Surgical History:  Procedure Laterality Date  . LYMPHADENECTOMY Bilateral  04/09/2014   Procedure: BILATERAL PELVIC LYMPH NODE DISSECTION;  Surgeon: Alexis Frock, MD;  Location: [facility];  Service: Urology;  Laterality: Bilateral;  . PROSTATE BIOPSY N/A 12/11/2013   Procedure: BIOPSY TRANSRECTAL ULTRASONIC PROSTATE (TUBP);  Surgeon: Arvil Persons, MD;  Location: [facility];  Service: Urology;  Laterality: N/A;  . ROBOT ASSISTED LAPAROSCOPIC RADICAL PROSTATECTOMY N/A 04/09/2014   Procedure: ROBOTIC ASSISTED LAPAROSCOPIC RADICAL PROSTATECTOMY, INDOCYANINE GREEN DYE ;  Surgeon: Alexis Frock, MD;  Location: [facility];  Service: Urology;  Laterality: N/A;  . TRANSTHORACIC ECHOCARDIOGRAM  10-14-2013   dr Shanon Brow harding   mild LVH/  ef  40-98%/  grade I diastolic dysfunction/  mild LAE    Social History   Social History  . Marital status: Single    Spouse name: N/A  . Number of children: 3  . Years of education: N/A   Occupational History  .  [employer]   Social History Main Topics  . Smoking status: Never Smoker  . Smokeless tobacco: Never Used  . Alcohol use No  . Drug use: No  . Sexual activity: Not Asked   Other Topics Concern  . None   Social History Narrative   He is a divorced father of 3. 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 He never smoked. He drinks about 3 glasses of wine with food a week. He uses to help digest food.   He is Jewish and into [employer] does not eat any pork/ham and only a small amount of beef.   Contact information:   Son: Sedrick Tober: [phone]   Daughter: Ohn Bostic: [phone]    Family History  Problem Relation Age of Onset  . Hypertension Mother   . Hypertension Brother   . Hypertension Maternal Grandmother   . Hypertension Paternal Grandmother   . Kidney failure Father   . Colon cancer Neg Hx   . Colon polyps Neg Hx   . Esophageal cancer Neg Hx   . Rectal  cancer Neg Hx   . Stomach cancer Neg Hx     Review of Systems  Constitutional: Negative for fever.  Respiratory: Negative for cough, shortness of breath and wheezing.   Cardiovascular: Positive for leg swelling. Negative for chest pain and palpitations.  Neurological: Negative for light-headedness and headaches.       Objective:   Vitals:   10/05/16 1444  BP: 128/88  Pulse: 78  Temp: 98.1 F (36.7 C)   Wt Readings from Last 3 Encounters:  10/05/16 245 lb 8 oz (111.4 kg)  08/24/16 254 lb (115.2 kg)  08/07/16 251 lb (113.9 kg)   Body mass index is 34.24 kg/m.   Physical Exam    Constitutional: Appears well-developed and well-nourished. No distress.  HENT:  Head: Normocephalic and atraumatic.  Neck: Neck supple. No tracheal deviation present. No thyromegaly present.  No cervical lymphadenopathy Cardiovascular: Normal rate, regular rhythm and normal heart sounds.   No murmur heard. No carotid bruit .  No edema Pulmonary/Chest: Effort normal and breath sounds normal. No respiratory distress. No has no wheezes. No rales.  Skin: Skin is warm and dry. Not diaphoretic.  Psychiatric: Normal mood and affect. Behavior is normal.      Assessment & Plan:    See Problem List for Assessment and Plan of chronic medical problems.

## 2016-10-05 NOTE — Patient Instructions (Signed)
  Test(s) ordered today. Your results will be released to MyChart (or called to you) after review, usually within 72hours after test completion. If any changes need to be made, you will be notified at that same time.   Medications reviewed and updated.  No changes recommended at this time.    Please followup in 3 months   

## 2016-10-05 NOTE — Assessment & Plan Note (Signed)
BP well controlled Current regimen effective and well tolerated Continue current medications at current doses  

## 2016-10-09 ENCOUNTER — Other Ambulatory Visit: Payer: Self-pay | Admitting: Urology

## 2016-11-12 ENCOUNTER — Encounter (HOSPITAL_BASED_OUTPATIENT_CLINIC_OR_DEPARTMENT_OTHER): Payer: Self-pay | Admitting: *Deleted

## 2016-11-12 NOTE — Progress Notes (Signed)
NPO AFTER MN W/ EXCEPTION CLEAR LIQUIDS UNTIL 0730 (NO CREAM/ MILK PRODUCTS).  ARRIVE AT 1200.  NEEDS ISTAT 8 AND EKG.  PT VERBALIZED UNDERSTANDING TO DO AM HIBICLENS SHOWER.

## 2016-11-19 ENCOUNTER — Encounter (HOSPITAL_COMMUNITY)
Admission: RE | Disposition: A | Discharge status: Home / Self Care | Payer: Self-pay | Source: Ambulatory Visit | Attending: Urology

## 2016-11-19 ENCOUNTER — Ambulatory Visit (HOSPITAL_BASED_OUTPATIENT_CLINIC_OR_DEPARTMENT_OTHER): Payer: Medicare HMO | Admitting: Anesthesiology

## 2016-11-19 ENCOUNTER — Observation Stay (HOSPITAL_BASED_OUTPATIENT_CLINIC_OR_DEPARTMENT_OTHER)
Admission: RE | Admit: 2016-11-19 | Discharge: 2016-11-20 | Disposition: A | Discharge status: Home / Self Care | Payer: Medicare HMO | Source: Ambulatory Visit | Attending: Urology | Admitting: Urology

## 2016-11-19 ENCOUNTER — Encounter (HOSPITAL_BASED_OUTPATIENT_CLINIC_OR_DEPARTMENT_OTHER): Payer: Self-pay | Admitting: Anesthesiology

## 2016-11-19 ENCOUNTER — Other Ambulatory Visit: Payer: Self-pay

## 2016-11-19 DIAGNOSIS — C61 Malignant neoplasm of prostate: Secondary | ICD-10-CM | POA: Diagnosis not present

## 2016-11-19 DIAGNOSIS — Z7984 Long term (current) use of oral hypoglycemic drugs: Secondary | ICD-10-CM | POA: Insufficient documentation

## 2016-11-19 DIAGNOSIS — E119 Type 2 diabetes mellitus without complications: Secondary | ICD-10-CM | POA: Diagnosis not present

## 2016-11-19 DIAGNOSIS — N529 Male erectile dysfunction, unspecified: Secondary | ICD-10-CM | POA: Diagnosis not present

## 2016-11-19 DIAGNOSIS — N5231 Erectile dysfunction following radical prostatectomy: Secondary | ICD-10-CM | POA: Diagnosis not present

## 2016-11-19 DIAGNOSIS — I10 Essential (primary) hypertension: Secondary | ICD-10-CM | POA: Insufficient documentation

## 2016-11-19 DIAGNOSIS — N5201 Erectile dysfunction due to arterial insufficiency: Secondary | ICD-10-CM

## 2016-11-19 HISTORY — DX: Personal history of malignant neoplasm of prostate: Z85.46

## 2016-11-19 HISTORY — DX: Type 2 diabetes mellitus without complications: E11.9

## 2016-11-19 HISTORY — PX: PENILE PROSTHESIS IMPLANT: SHX240

## 2016-11-19 LAB — POCT I-STAT, CHEM 8
BUN: 23 mg/dL — ABNORMAL HIGH (ref 6–20)
CALCIUM ION: 1.44 mmol/L — AB (ref 1.15–1.40)
CHLORIDE: 101 mmol/L (ref 101–111)
CREATININE: 1.4 mg/dL — AB (ref 0.61–1.24)
GLUCOSE: 94 mg/dL (ref 65–99)
HCT: 42 % (ref 39.0–52.0)
Hemoglobin: 14.3 g/dL (ref 13.0–17.0)
POTASSIUM: 2.9 mmol/L — AB (ref 3.5–5.1)
Sodium: 142 mmol/L (ref 135–145)
TCO2: 27 mmol/L (ref 0–100)

## 2016-11-19 LAB — GLUCOSE, CAPILLARY
GLUCOSE-CAPILLARY: 95 mg/dL (ref 65–99)
GLUCOSE-CAPILLARY: 103 mg/dL — AB (ref 65–99)
Glucose-Capillary: 207 mg/dL — ABNORMAL HIGH (ref 65–99)

## 2016-11-19 LAB — BASIC METABOLIC PANEL
Anion gap: 9 (ref 5–15)
BUN: 21 mg/dL — AB (ref 6–20)
CHLORIDE: 105 mmol/L (ref 101–111)
CO2: 24 mmol/L (ref 22–32)
Calcium: 10.4 mg/dL — ABNORMAL HIGH (ref 8.9–10.3)
Creatinine, Ser: 1.47 mg/dL — ABNORMAL HIGH (ref 0.61–1.24)
GFR calc non Af Amer: 50 mL/min — ABNORMAL LOW (ref >60)
GFR, EST AFRICAN AMERICAN: 59 mL/min — AB (ref >60)
Glucose, Bld: 124 mg/dL — ABNORMAL HIGH (ref 65–99)
POTASSIUM: 4 mmol/L (ref 3.5–5.1)
SODIUM: 138 mmol/L (ref 135–145)

## 2016-11-19 LAB — CBC
HCT: 42.6 % (ref 39.0–52.0)
HEMOGLOBIN: 14.6 g/dL (ref 13.0–17.0)
MCH: 30.7 pg (ref 26.0–34.0)
MCHC: 34.3 g/dL (ref 30.0–36.0)
MCV: 89.7 fL (ref 78.0–100.0)
Platelets: 225 10*3/uL (ref 150–400)
RBC: 4.75 MIL/uL (ref 4.22–5.81)
RDW: 13.6 % (ref 11.5–15.5)
WBC: 12.2 10*3/uL — ABNORMAL HIGH (ref 4.0–10.5)

## 2016-11-19 SURGERY — INSERTION, PENILE PROSTHESIS, INFLATABLE
Anesthesia: General | Site: Penis

## 2016-11-19 MED ORDER — SODIUM CHLORIDE 0.9 % IR SOLN
Status: DC | PRN
  Administered 2016-11-19: 500 mL

## 2016-11-19 MED ORDER — FENTANYL CITRATE (PF) 100 MCG/2ML IJ SOLN
INTRAMUSCULAR | Status: AC
  Filled 2016-11-19: qty 2

## 2016-11-19 MED ORDER — MIDAZOLAM HCL 2 MG/2ML IJ SOLN
INTRAMUSCULAR | Status: AC
  Filled 2016-11-19: qty 2

## 2016-11-19 MED ORDER — PROPOFOL 10 MG/ML IV BOLUS
INTRAVENOUS | Status: AC
  Filled 2016-11-19: qty 20

## 2016-11-19 MED ORDER — ROCURONIUM BROMIDE 50 MG/5ML IV SOSY
PREFILLED_SYRINGE | INTRAVENOUS | Status: AC
  Filled 2016-11-19: qty 5

## 2016-11-19 MED ORDER — HYDROMORPHONE HCL 1 MG/ML IJ SOLN
0.5000 mg | INTRAMUSCULAR | Status: DC | PRN
  Administered 2016-11-19: 1 mg via INTRAVENOUS
  Administered 2016-11-19: 0.5 mg via INTRAVENOUS
  Filled 2016-11-19: qty 0.5
  Filled 2016-11-19: qty 1

## 2016-11-19 MED ORDER — LIDOCAINE HCL 1 % IJ SOLN
INTRAMUSCULAR | Status: DC | PRN
  Administered 2016-11-19: 100 mg via INTRADERMAL

## 2016-11-19 MED ORDER — ACETAMINOPHEN 160 MG/5ML PO SOLN
325.0000 mg | ORAL | Status: DC | PRN
  Filled 2016-11-19: qty 20.3

## 2016-11-19 MED ORDER — ACETAMINOPHEN 325 MG PO TABS
325.0000 mg | ORAL_TABLET | ORAL | Status: DC | PRN
  Filled 2016-11-19: qty 2

## 2016-11-19 MED ORDER — PROPOFOL 10 MG/ML IV BOLUS
INTRAVENOUS | Status: DC | PRN
  Administered 2016-11-19: 50 mg via INTRAVENOUS
  Administered 2016-11-19: 200 mg via INTRAVENOUS
  Administered 2016-11-19: 50 mg via INTRAVENOUS

## 2016-11-19 MED ORDER — FENTANYL CITRATE (PF) 100 MCG/2ML IJ SOLN
INTRAMUSCULAR | Status: DC | PRN
  Administered 2016-11-19: 100 ug via INTRAVENOUS
  Administered 2016-11-19: 50 ug via INTRAVENOUS
  Administered 2016-11-19: 100 ug via INTRAVENOUS
  Administered 2016-11-19: 50 ug via INTRAVENOUS

## 2016-11-19 MED ORDER — VANCOMYCIN HCL 10 G IV SOLR
1500.0000 mg | INTRAVENOUS | Status: AC
  Administered 2016-11-19: 1500 mg via INTRAVENOUS
  Filled 2016-11-19 (×2): qty 1500

## 2016-11-19 MED ORDER — INSULIN ASPART 100 UNIT/ML ~~LOC~~ SOLN
0.0000 [IU] | Freq: Three times a day (TID) | SUBCUTANEOUS | Status: DC
  Administered 2016-11-20 (×2): 3 [IU] via SUBCUTANEOUS

## 2016-11-19 MED ORDER — ONDANSETRON HCL 4 MG/2ML IJ SOLN: 4.0000 mg | INTRAMUSCULAR | Status: DC | PRN

## 2016-11-19 MED ORDER — SODIUM CHLORIDE 0.9 % IV SOLN
INTRAVENOUS | Status: DC
  Administered 2016-11-19 – 2016-11-20 (×2): via INTRAVENOUS

## 2016-11-19 MED ORDER — EPHEDRINE 5 MG/ML INJ
INTRAVENOUS | Status: AC
  Filled 2016-11-19: qty 10

## 2016-11-19 MED ORDER — FENTANYL CITRATE (PF) 100 MCG/2ML IJ SOLN
INTRAMUSCULAR | Status: AC
  Filled 2016-11-19: qty 4

## 2016-11-19 MED ORDER — ATROPINE SULFATE 0.4 MG/ML IJ SOLN
INTRAMUSCULAR | Status: AC
  Filled 2016-11-19: qty 1

## 2016-11-19 MED ORDER — PROMETHAZINE HCL 25 MG/ML IJ SOLN
6.2500 mg | INTRAMUSCULAR | Status: DC | PRN
  Filled 2016-11-19: qty 1

## 2016-11-19 MED ORDER — ZOLPIDEM TARTRATE 5 MG PO TABS: 5.0000 mg | ORAL_TABLET | Freq: Every evening | ORAL | Status: DC | PRN

## 2016-11-19 MED ORDER — EPHEDRINE SULFATE 50 MG/ML IJ SOLN
INTRAMUSCULAR | Status: DC | PRN
  Administered 2016-11-19: 10 mg via INTRAVENOUS

## 2016-11-19 MED ORDER — GENTAMICIN SULFATE 40 MG/ML IJ SOLN
5.0000 mg/kg | INTRAVENOUS | Status: DC
  Filled 2016-11-19: qty 14

## 2016-11-19 MED ORDER — HYDROCODONE-ACETAMINOPHEN 5-325 MG PO TABS
1.0000 | ORAL_TABLET | ORAL | Status: DC | PRN
  Administered 2016-11-20 (×3): 2 via ORAL
  Filled 2016-11-19 (×3): qty 2

## 2016-11-19 MED ORDER — SODIUM CHLORIDE 0.9 % IR SOLN
Freq: Once | Status: DC
  Filled 2016-11-19 (×2): qty 500000

## 2016-11-19 MED ORDER — MEPERIDINE HCL 25 MG/ML IJ SOLN
6.2500 mg | INTRAMUSCULAR | Status: DC | PRN
  Filled 2016-11-19: qty 1

## 2016-11-19 MED ORDER — DIPHENHYDRAMINE HCL 12.5 MG/5ML PO ELIX: 12.5000 mg | ORAL_SOLUTION | Freq: Four times a day (QID) | ORAL | Status: DC | PRN

## 2016-11-19 MED ORDER — GENTAMICIN SULFATE 40 MG/ML IJ SOLN
440.0000 mg | INTRAVENOUS | Status: AC
  Administered 2016-11-19: 440 mg via INTRAVENOUS
  Filled 2016-11-19: qty 11

## 2016-11-19 MED ORDER — DEXAMETHASONE SODIUM PHOSPHATE 10 MG/ML IJ SOLN
INTRAMUSCULAR | Status: DC | PRN
  Administered 2016-11-19: 10 mg via INTRAVENOUS

## 2016-11-19 MED ORDER — PHENYLEPHRINE 40 MCG/ML (10ML) SYRINGE FOR IV PUSH (FOR BLOOD PRESSURE SUPPORT)
PREFILLED_SYRINGE | INTRAVENOUS | Status: AC
  Filled 2016-11-19: qty 10

## 2016-11-19 MED ORDER — FENTANYL CITRATE (PF) 100 MCG/2ML IJ SOLN
25.0000 ug | INTRAMUSCULAR | Status: DC | PRN
  Administered 2016-11-19 (×4): 50 ug via INTRAVENOUS
  Filled 2016-11-19: qty 1

## 2016-11-19 MED ORDER — VANCOMYCIN HCL IN DEXTROSE 1-5 GM/200ML-% IV SOLN
1000.0000 mg | Freq: Two times a day (BID) | INTRAVENOUS | Status: AC
  Administered 2016-11-20: 1000 mg via INTRAVENOUS
  Filled 2016-11-19: qty 200

## 2016-11-19 MED ORDER — BELLADONNA ALKALOIDS-OPIUM 16.2-60 MG RE SUPP: 1.0000 | Freq: Four times a day (QID) | RECTAL | Status: DC | PRN

## 2016-11-19 MED ORDER — MIDAZOLAM HCL 5 MG/5ML IJ SOLN
INTRAMUSCULAR | Status: DC | PRN
  Administered 2016-11-19: 2 mg via INTRAVENOUS

## 2016-11-19 MED ORDER — GENTAMICIN SULFATE 40 MG/ML IJ SOLN
440.0000 mg | Freq: Once | INTRAVENOUS | Status: AC
  Administered 2016-11-20: 440 mg via INTRAVENOUS
  Filled 2016-11-19: qty 11

## 2016-11-19 MED ORDER — ONDANSETRON HCL 4 MG/2ML IJ SOLN
INTRAMUSCULAR | Status: DC | PRN
  Administered 2016-11-19: 4 mg via INTRAVENOUS

## 2016-11-19 MED ORDER — DIPHENHYDRAMINE HCL 50 MG/ML IJ SOLN: 12.5000 mg | Freq: Four times a day (QID) | INTRAMUSCULAR | Status: DC | PRN

## 2016-11-19 MED ORDER — KETOROLAC TROMETHAMINE 30 MG/ML IJ SOLN
30.0000 mg | Freq: Once | INTRAMUSCULAR | Status: DC | PRN
  Filled 2016-11-19: qty 1

## 2016-11-19 MED ORDER — POLYMYXIN B SULFATE 500000 UNITS IJ SOLR
INTRAMUSCULAR | Status: AC
  Filled 2016-11-19: qty 500000

## 2016-11-19 MED ORDER — LACTATED RINGERS IV SOLN
INTRAVENOUS | Status: DC
  Administered 2016-11-19: 11:00:00 via INTRAVENOUS
  Filled 2016-11-19: qty 1000

## 2016-11-19 MED ORDER — LIDOCAINE 2% (20 MG/ML) 5 ML SYRINGE
INTRAMUSCULAR | Status: AC
  Filled 2016-11-19: qty 5

## 2016-11-19 MED ORDER — ACETAMINOPHEN 325 MG PO TABS: 650.0000 mg | ORAL_TABLET | ORAL | Status: DC | PRN

## 2016-11-19 MED ORDER — PHENYLEPHRINE HCL 10 MG/ML IJ SOLN
INTRAMUSCULAR | Status: DC | PRN
  Administered 2016-11-19: 80 ug via INTRAVENOUS

## 2016-11-19 SURGICAL SUPPLY — 62 items
BAG URINE DRAINAGE (UROLOGICAL SUPPLIES) ×2 IMPLANT
BLADE SURG 15 STRL LF DISP TIS (BLADE) ×1 IMPLANT
BLADE SURG 15 STRL SS (BLADE) ×1
BNDG GAUZE ELAST 4 BULKY (GAUZE/BANDAGES/DRESSINGS) ×2 IMPLANT
BRIEF STRETCH FOR OB PAD LRG (UNDERPADS AND DIAPERS) ×2 IMPLANT
BRUSH SCRUB EZ PLAIN DRY (MISCELLANEOUS) ×2 IMPLANT
CANISTER SUCT 3000ML PPV (MISCELLANEOUS) ×2 IMPLANT
CATH FOLEY 2WAY SLVR  5CC 16FR (CATHETERS) ×1
CATH FOLEY 2WAY SLVR 5CC 16FR (CATHETERS) ×1 IMPLANT
CLEANER CAUTERY TIP 5X5 PAD (MISCELLANEOUS) ×1 IMPLANT
COVER BACK TABLE 60X90IN (DRAPES) ×2 IMPLANT
COVER MAYO STAND STRL (DRAPES) ×4 IMPLANT
DERMABOND ADVANCED (GAUZE/BANDAGES/DRESSINGS) ×1
DERMABOND ADVANCED .7 DNX12 (GAUZE/BANDAGES/DRESSINGS) ×1 IMPLANT
DISSECTOR ROUND CHERRY 3/8 STR (MISCELLANEOUS) ×2 IMPLANT
DRAPE INCISE IOBAN 66X45 STRL (DRAPES) ×2 IMPLANT
DRAPE LAPAROTOMY T 102X78X121 (DRAPES) ×2 IMPLANT
ELECT NEEDLE BLADE 2-5/6 (NEEDLE) ×2 IMPLANT
ELECT REM PT RETURN 9FT ADLT (ELECTROSURGICAL) ×2
ELECTRODE REM PT RTRN 9FT ADLT (ELECTROSURGICAL) ×1 IMPLANT
GLOVE BIO SURGEON STRL SZ8 (GLOVE) ×2 IMPLANT
GLOVE INDICATOR 7.0 STRL GRN (GLOVE) ×2 IMPLANT
GLOVE INDICATOR 7.5 STRL GRN (GLOVE) ×4 IMPLANT
GOWN STRL REUS W/ TWL XL LVL3 (GOWN DISPOSABLE) ×2 IMPLANT
GOWN STRL REUS W/TWL XL LVL3 (GOWN DISPOSABLE) ×2
IMPL RTE STACKING CX LGX1.5 (Breast) ×1 IMPLANT
IMPLANT RTE STACKING CX LGX1.5 (Breast) ×2 IMPLANT
KIT ACCESSORY AMS 700 PUMP (UROLOGICAL SUPPLIES) ×2 IMPLANT
KIT RM TURNOVER CYSTO AR (KITS) ×2 IMPLANT
MANIFOLD NEPTUNE II (INSTRUMENTS) IMPLANT
NEEDLE HYPO 22GX1.5 SAFETY (NEEDLE) ×2 IMPLANT
NS IRRIG 500ML POUR BTL (IV SOLUTION) ×2 IMPLANT
PACK BASIN DAY SURGERY FS (CUSTOM PROCEDURE TRAY) ×2 IMPLANT
PAD CLEANER CAUTERY TIP 5X5 (MISCELLANEOUS) ×1
PENCIL BUTTON HOLSTER BLD 10FT (ELECTRODE) ×2 IMPLANT
PLUG CATH AND CAP STER (CATHETERS) ×2 IMPLANT
PUMP PRECONNECT MS 18 LGX (Miscellaneous) ×1 IMPLANT
PUMP PRECONNECT MS 18CM LGX (Miscellaneous) ×2 IMPLANT
RESERVOIR PENILE 100ML (Miscellaneous) ×2 IMPLANT
RETRACTOR WILSON SYSTEM (INSTRUMENTS) ×2 IMPLANT
Rear Tip Extender ×2 IMPLANT
SPONGE GAUZE 4X4 12PLY (GAUZE/BANDAGES/DRESSINGS) IMPLANT
SPONGE LAP 4X18 X RAY DECT (DISPOSABLE) IMPLANT
SUPPORT SCROTAL LG STRP (MISCELLANEOUS) IMPLANT
SUT MNCRL AB 4-0 PS2 18 (SUTURE) ×2 IMPLANT
SUT VIC AB 2-0 SH 18 (SUTURE) ×2 IMPLANT
SUT VIC AB 2-0 UR6 27 (SUTURE) ×8 IMPLANT
SUT VIC AB 3-0 PS2 18 (SUTURE)
SUT VIC AB 3-0 PS2 18XBRD (SUTURE) IMPLANT
SUT VIC AB 3-0 SH 27 (SUTURE) ×1
SUT VIC AB 3-0 SH 27X BRD (SUTURE) ×1 IMPLANT
SUT VICRYL 2 0 18  UND BR (SUTURE)
SUT VICRYL 2 0 18 UND BR (SUTURE) IMPLANT
SYR 20CC LL (SYRINGE) ×2 IMPLANT
SYR 50ML LL SCALE MARK (SYRINGE) ×4 IMPLANT
SYR BULB IRRIGATION 50ML (SYRINGE) ×2 IMPLANT
SYR CONTROL 10ML LL (SYRINGE) ×2 IMPLANT
SYRINGE 10CC LL (SYRINGE) ×2 IMPLANT
TRAY DSU PREP LF (CUSTOM PROCEDURE TRAY) ×2 IMPLANT
TUBE CONNECTING 12X1/4 (SUCTIONS) ×2 IMPLANT
WATER STERILE IRR 500ML POUR (IV SOLUTION) ×2 IMPLANT
YANKAUER SUCT BULB TIP NO VENT (SUCTIONS) ×2 IMPLANT

## 2016-11-19 NOTE — Transfer of Care (Signed)
Immediate Anesthesia Transfer of Care Note  Patient: Derrick Walker  Procedure(s) Performed: Procedure(s): AMS PENILE PROTHESIS INFLATABLE (N/A)  Patient Location: PACU  Anesthesia Type:General  Level of Consciousness: awake, alert , oriented and patient cooperative  Airway & Oxygen Therapy: Patient Spontanous Breathing and Patient connected to nasal cannula oxygen  Post-op Assessment: Report given to RN and Post -op Vital signs reviewed and stable  Post vital signs: Reviewed and stable  Last Vitals:  Vitals:   11/19/16 1031  BP: (!) 156/76  Pulse: 73  Resp: 16  Temp: 36.4 C    Last Pain:  Vitals:   11/19/16 1031  TempSrc: Oral      Patients Stated Pain Goal: 8 (77/37/36 6815)  Complications: No apparent anesthesia complications

## 2016-11-19 NOTE — Anesthesia Preprocedure Evaluation (Addendum)
Anesthesia Evaluation  Patient identified by MRN, date of birth, ID band Patient awake    Reviewed: Allergy & Precautions, NPO status , Patient's Chart, lab work & pertinent test results  Airway Mallampati: I       Dental no notable dental hx.    Pulmonary neg pulmonary ROS,    Pulmonary exam normal breath sounds clear to auscultation       Cardiovascular hypertension, Pt. on medications Normal cardiovascular exam Rhythm:Regular Rate:Normal  10-14-13 2D Echo Impressions:  Normal LV size and systolic function, EF 16-07%. Mild LV hypertrophy. Normal RV size and systolic function. No significant valvular abnormalities.   Neuro/Psych negative neurological ROS     GI/Hepatic negative GI ROS, Neg liver ROS,   Endo/Other  diabetes, Well Controlled, Type 2, Oral Hypoglycemic Agents  Renal/GU negative Renal ROS  negative genitourinary   Musculoskeletal negative musculoskeletal ROS (+)   Abdominal (+) + obese,   Peds  Hematology negative hematology ROS (+)   Anesthesia Other Findings   Reproductive/Obstetrics negative OB ROS                           Anesthesia Physical Anesthesia Plan  ASA: II  Anesthesia Plan: General   Post-op Pain Management:    Induction: Intravenous  Airway Management Planned: LMA  Additional Equipment:   Intra-op Plan:   Post-operative Plan:   Informed Consent: I have reviewed the patients History and Physical, chart, labs and discussed the procedure including the risks, benefits and alternatives for the proposed anesthesia with the patient or authorized representative who has indicated his/her understanding and acceptance.     Plan Discussed with: CRNA and Surgeon  Anesthesia Plan Comments:         Anesthesia Quick Evaluation

## 2016-11-19 NOTE — Anesthesia Postprocedure Evaluation (Signed)
Anesthesia Post Note  Patient: Derrick Walker  Procedure(s) Performed: Procedure(s) (LRB): AMS PENILE PROTHESIS INFLATABLE (N/A)  Patient location during evaluation: PACU Anesthesia Type: General Level of consciousness: awake and alert, oriented and patient cooperative Pain management: pain level controlled Vital Signs Assessment: post-procedure vital signs reviewed and stable Respiratory status: spontaneous breathing, nonlabored ventilation, respiratory function stable and patient connected to nasal cannula oxygen Cardiovascular status: blood pressure returned to baseline and stable Postop Assessment: no signs of nausea or vomiting Anesthetic complications: no       Last Vitals:  Vitals:   11/19/16 1615 11/19/16 1643  BP: (!) 168/98 (!) 167/98  Pulse: (!) 102 92  Resp: 20 16  Temp: 36.5 C 37.1 C    Last Pain:  Vitals:   11/19/16 1702  TempSrc:   PainSc: 5                  Catherene Kaleta,E. Dietrich Ke

## 2016-11-19 NOTE — Anesthesia Procedure Notes (Signed)
Procedure Name: LMA Insertion Date/Time: 11/19/2016 12:53 PM Performed by: Wanita Chamberlain Pre-anesthesia Checklist: Patient identified, Timeout performed, Emergency Drugs available, Suction available and Patient being monitored Patient Re-evaluated:Patient Re-evaluated prior to inductionOxygen Delivery Method: Circle system utilized Preoxygenation: Pre-oxygenation with 100% oxygen Intubation Type: IV induction Ventilation: Mask ventilation without difficulty LMA: LMA inserted LMA Size: 4.0 Number of attempts: 1 Placement Confirmation: breath sounds checked- equal and bilateral and positive ETCO2 Tube secured with: Tape Dental Injury: Teeth and Oropharynx as per pre-operative assessment

## 2016-11-19 NOTE — Op Note (Signed)
Preoperative diagnosis: Erectile Dysfunction  Postoperative diagnosis: Same  Procedure: 1. Placement of an AMS 700 3 Piece inflatable penile prosthesis  Attending: Rosie Fate, MD  Anesthesia: General  History of blood loss: Minimal  Antibiotics: Vancomycin and Gentamicin  Drains: 16 french foley  Specimens: none  Findings: 100cc reservoir placed on the left. 23.5cm cylinder length on right and left. 18cm cylinder with 5.5cm rear tip extension. No deformity or curvature on cycling of the device  Indications: Patient is a 61 year old male with a history of erectile dysfunction who has failed medical therapy.  We discussed the treatment options and he has elected to pursue penile prosthesis insertion.   Procedure in detail: Prior to procedure consent was obtained.  Patient was brought to the operating room and a brief timeout was done to ensure correct patient, correct procedure, correct site.  General anesthesia was administered and patient was placed in supine position. We performed a 10 minute scrub of his genitalia prior to the using alcohol prep.   His genitalia and abdomen was then prepped and draped in usual sterile fashion.  A 16 French foley catheter was placed and the bladder was drained. The penile was then placed on stretch with the aid of a hook through the meatus attached to the lonestar retractor. A 4 cm incision was made at the penoscrotal junction.  We dissected down to the urethra and corporal bodies.  We then placed 2 stay sutures on the medial and lateral aspect of the corporal bodies with 2-0 vicryl. We then used electrocautery to make a 2cm longitudinal incision between the stay sutures on the right corporal body. We then used sequential dilators to dilate the proximal and distal corporal body to 77mm. We the measured the proximal corporal length which was 12. We then measured the distal corporal length which was 11.5.  The corporal body was the irrigated with normal  saline. We then did a similar technique on the left. The proximal corporal length was 12cm and the distal corporal length was 11.5cm. We then turned our attention to placing the reservoir. We used blunt dissection along the left spermatic cord to create a space past the inguinal canal. We then placed the reservoir and filled it with 100cc of normal saline and then a rubber shod was placed on the tubing. WE then turned our attention to placing the cylinders. We placed the proximal end of the right cylinder into the corpora and seated it with the aid of the applicator. We the placed the string attached to the distal end of the cylinder through a [place] needle. We then attached the needle to the furlough and advanced the furlough through the corporal incision up to the glans. The needle was then advanced through the glans and the string was then secured with a snap. Once cylinder was seated int he corpora we then tied the stay sutures over the cylinder. We then turned our attention to the left corpora. A similar technique was used to place the left cylinder. Once this was complete we then tested the device and noted no leaks and a straight erection.We then deflated the device. We then proceeded to make a subdartos pocket in the scrotum for the pump. Once this was complete the pump was placed the the pouch and the pouch was then closed with a running 2-0 Vicryl. We then turned our attention to connecting the device. The tubing was cut to length and then the locking clips were placed on either end of the  tubing. A barrel connector was then placed on on end of the tubing. The tubing was then irrigated with normal saline to ensure no air bubbles in the tubing. The free end of the barrel was then attached to the reservoir tubing and using the crimping tool the barrel was secured to the tubing. We then closed the dartos over the tubing suing a 2-0 vicryl in a running fashion. We then closed a second layer of dartos over the  tubing. The skin was then closed with 4-0 monocryl in a running fashion. Skin glue was then placed over the incision. We then removed the strings attached to the distal ends of the cylinders and placed skin glue over the incisions in the glans. The device was then cycled to partially erect.  We then placed a scrotal fluff and this then concluded the procedure which was well tolerated by the patient.  Complications: None  Condition: Stable, extubated, transferred to PACU.  Plan: Patient is to be admitted overnight for IV antibiotics.  His foley will be removed in the morning and the device will be deactivated. He will followup in 2 weeks for a wound check. He will be discharged with 1 week of antibiotics.

## 2016-11-19 NOTE — H&P (Signed)
Urology Admission H&P  Chief Complaint: ED  History of Present Illness: [name] is a 61yo ith ED refractory to medical therayp  Past Medical History:  Diagnosis Date  . Erectile dysfunction   . History of prostate cancer urologist-  dr Orpah Melter   dx 05/ 2015 -- moderate risk Stage T2c, Gleason 3+4,  vol 152.41cc---  09/ 2015 s/p  radical prostatectomy -- per pt last PSA 07-15-2016  undectable  . Hypertension, essential   . Type 2 diabetes mellitus ([place])    Past Surgical History:  Procedure Laterality Date  . LYMPHADENECTOMY Bilateral 04/09/2014   Procedure: BILATERAL PELVIC LYMPH NODE DISSECTION;  Surgeon: Alexis Frock, MD;  Location: [facility];  Service: Urology;  Laterality: Bilateral;  . PROSTATE BIOPSY N/A 12/11/2013   Procedure: BIOPSY TRANSRECTAL ULTRASONIC PROSTATE (TUBP);  Surgeon: Arvil Persons, MD;  Location: [facility];  Service: Urology;  Laterality: N/A;  . ROBOT ASSISTED LAPAROSCOPIC RADICAL PROSTATECTOMY N/A 04/09/2014   Procedure: ROBOTIC ASSISTED LAPAROSCOPIC RADICAL PROSTATECTOMY, INDOCYANINE GREEN DYE ;  Surgeon: Alexis Frock, MD;  Location: [facility];  Service: Urology;  Laterality: N/A;  . TRANSTHORACIC ECHOCARDIOGRAM  10-14-2013   dr Shanon Brow harding   mild LVH/  ef  40-08%/  grade I diastolic dysfunction/  mild LAE    Home Medications:  Prescriptions Prior to Admission  Medication Sig Dispense Refill Last Dose  . canagliflozin (INVOKANA) 100 MG TABS tablet Take 1 tablet (100 mg total) by mouth daily before breakfast. 30 tablet 5 11/18/2016 at Unknown time  . hydrochlorothiazide (HYDRODIURIL) 25 MG tablet Take 1 tablet (25 mg total) by mouth daily. (Patient taking differently: Take 25 mg by mouth every morning. ) 90 tablet 3 11/18/2016 at Unknown time  . metFORMIN (GLUCOPHAGE) 1000 MG tablet Take 1 tablet (1,000 mg total) by mouth 2 (two) times daily with a meal. 180 tablet 3 11/18/2016 at Unknown time  . EPINEPHrine 0.3 mg/0.3 mL IJ SOAJ  injection Inject into the muscle as needed.   Unknown at Unknown time   Allergies:  Allergies  Allergen Reactions  . Shellfish Allergy Anaphylaxis  . Losartan     Kidney pain, leg swelling  . Oxycodone Hives    percocet    Family History  Problem Relation Age of Onset  . Hypertension Mother   . Hypertension Maternal Grandmother   . Hypertension Paternal Grandmother   . Kidney failure Father   . Hypertension Brother   . Colon cancer Neg Hx   . Colon polyps Neg Hx   . Esophageal cancer Neg Hx   . Rectal cancer Neg Hx   . Stomach cancer Neg Hx    Social History:  reports that he has never smoked. He has never used smokeless tobacco. He reports that he does not drink alcohol or use drugs.  Review of Systems  All other systems reviewed and are negative.   Physical Exam:  Vital signs in last 24 hours: Temp:  [97.5 F (36.4 C)] 97.5 F (36.4 C) (04/30 1031) Pulse Rate:  [73] 73 (04/30 1031) Resp:  [16] 16 (04/30 1031) BP: (156)/(76) 156/76 (04/30 1031) SpO2:  [100 %] 100 % (04/30 1031) Weight:  [105 kg (231 lb 8 oz)] 105 kg (231 lb 8 oz) (04/30 1031) Physical Exam  Constitutional: He is oriented to person, place, and time. He appears well-developed and well-nourished.  HENT:  Head: Normocephalic and atraumatic.  Eyes: EOM are normal. Pupils are equal, round, and reactive to light.  Neck:  Normal range of motion. No thyromegaly present.  Cardiovascular: Normal rate and regular rhythm.   Respiratory: Effort normal. No respiratory distress.  GI: Soft. He exhibits no distension.  Musculoskeletal: Normal range of motion. He exhibits no edema.  Neurological: He is alert and oriented to person, place, and time.  Skin: Skin is warm and dry.  Psychiatric: He has a normal mood and affect. His behavior is normal. Judgment and thought content normal.    Laboratory Data:  Results for orders placed or performed during the hospital encounter of 11/19/16 (from the past 24 hour(s))   I-STAT, chem 8     Status: Abnormal   Collection Time: 11/19/16 11:20 AM  Result Value Ref Range   Sodium 142 135 - 145 mmol/L   Potassium 2.9 (L) 3.5 - 5.1 mmol/L   Chloride 101 101 - 111 mmol/L   BUN 23 (H) 6 - 20 mg/dL   Creatinine, Ser 1.40 (H) 0.61 - 1.24 mg/dL   Glucose, Bld 94 65 - 99 mg/dL   Calcium, Ion 1.44 (H) 1.15 - 1.40 mmol/L   TCO2 27 0 - 100 mmol/L   Hemoglobin 14.3 13.0 - 17.0 g/dL   HCT 42.0 39.0 - 52.0 %   No results found for this or any previous visit (from the past 240 hour(s)). Creatinine:  Recent Labs  11/19/16 1120  CREATININE 1.40*   Baseline Creatinine: 1.4  Impression/Assessment:  60yo with ED  Plan:  The risks/benefits/alternatives to IPP placement was explained to the patient and he understands and wishes to proceed with surgery  Nicolette Bang 11/19/2016, 12:10 PM

## 2016-11-19 NOTE — Progress Notes (Signed)
Pharmacy Antibiotic Note  Derrick Walker is a 61 y.o. male with PMH of erectile dysfunction refractory to medical therapy admitted on 11/19/2016 for placement of inflatable penile prosthesis. Patient received a dose of Gentamicin and Vancomycin pre-operatively, and both agents have also been ordered post-op with pharmacy assistance in dosing requested for Gentamicin for surgical prophylaxis.   Plan: Gentamicin 440mg  IV x 1 on 11/20/2016 at 1230.  Pharmacy will sign off at this time. Please re-consult as needed.   Height: 5\' 11"  (180.3 cm) Weight: 231 lb (104.8 kg) IBW/kg (Calculated) : 75.3  Temp (24hrs), Avg:97.9 F (36.6 C), Min:97.5 F (36.4 C), Max:98.7 F (37.1 C)   Recent Labs Lab 11/19/16 1120 11/19/16 1626  WBC  --  12.2*  CREATININE 1.40* 1.47*    Estimated Creatinine Clearance: 66.7 mL/min (A) (by C-G formula based on SCr of 1.47 mg/dL (H)).    Allergies  Allergen Reactions  . Shellfish Allergy Anaphylaxis  . Losartan     Kidney pain, leg swelling  . Oxycodone Hives    percocet    Antimicrobials this admission: 4/30 >> Vancomycin peri-operatively >> 5/1 4/30 >> Gentamicin peri-operatively >> 5/1  Thank you for allowing pharmacy to be a part of this patient's care.    Lindell Spar, PharmD, BCPS Pager: [phone] 11/19/2016 5:29 PM

## 2016-11-20 ENCOUNTER — Encounter (HOSPITAL_BASED_OUTPATIENT_CLINIC_OR_DEPARTMENT_OTHER): Payer: Self-pay | Admitting: Urology

## 2016-11-20 DIAGNOSIS — N529 Male erectile dysfunction, unspecified: Secondary | ICD-10-CM | POA: Diagnosis not present

## 2016-11-20 LAB — BASIC METABOLIC PANEL
ANION GAP: 8 (ref 5–15)
BUN: 18 mg/dL (ref 6–20)
CALCIUM: 9.9 mg/dL (ref 8.9–10.3)
CO2: 27 mmol/L (ref 22–32)
CREATININE: 1.33 mg/dL — AB (ref 0.61–1.24)
Chloride: 102 mmol/L (ref 101–111)
GFR calc non Af Amer: 57 mL/min — ABNORMAL LOW (ref >60)
Glucose, Bld: 161 mg/dL — ABNORMAL HIGH (ref 65–99)
Potassium: 3.2 mmol/L — ABNORMAL LOW (ref 3.5–5.1)
SODIUM: 137 mmol/L (ref 135–145)

## 2016-11-20 LAB — GLUCOSE, CAPILLARY
Glucose-Capillary: 155 mg/dL — ABNORMAL HIGH (ref 65–99)
Glucose-Capillary: 159 mg/dL — ABNORMAL HIGH (ref 65–99)

## 2016-11-20 LAB — CBC
HEMATOCRIT: 36.2 % — AB (ref 39.0–52.0)
Hemoglobin: 12.2 g/dL — ABNORMAL LOW (ref 13.0–17.0)
MCH: 30.1 pg (ref 26.0–34.0)
MCHC: 33.7 g/dL (ref 30.0–36.0)
MCV: 89.4 fL (ref 78.0–100.0)
Platelets: 222 10*3/uL (ref 150–400)
RBC: 4.05 MIL/uL — ABNORMAL LOW (ref 4.22–5.81)
RDW: 13.6 % (ref 11.5–15.5)
WBC: 9.3 10*3/uL (ref 4.0–10.5)

## 2016-11-20 MED ORDER — SULFAMETHOXAZOLE-TRIMETHOPRIM 800-160 MG PO TABS: 1.0000 | ORAL_TABLET | Freq: Two times a day (BID) | ORAL | 0 refills | Status: DC

## 2016-11-20 MED ORDER — HYDROCODONE-ACETAMINOPHEN 5-325 MG PO TABS: 1.0000 | ORAL_TABLET | ORAL | 0 refills | Status: DC | PRN

## 2016-11-20 NOTE — Care Management Note (Signed)
Case Management Note  Patient Details  Name: Derrick Walker MRN: [id number] Date of Birth: 16-Oct-1955  Subjective/Objective:  61 y/o m admitted w/erectile dysfunction. From home.                  Action/Plan:d/c home.   Expected Discharge Date:  11/20/16               Expected Discharge Plan:  Home/Self Care  In-House Referral:     Discharge planning Services  CM Consult  Post Acute Care Choice:    Choice offered to:     DME Arranged:    DME Agency:     HH Arranged:    HH Agency:     Status of Service:  Completed, signed off  If discussed at [employer] of Stay Meetings, dates discussed:    Additional Comments:  Dessa Phi, RN 11/20/2016, 10:30 AM

## 2016-11-20 NOTE — Discharge Instructions (Signed)
Penile Prosthesis Implantation Penile prosthesis implantation is a procedure to put a device that treats erectile dysfunction into the penis. There are two main types of devices that can be put in during the procedure: malleable penile implants and inflatable penile implants. Malleable penile implant  A malleable penile implant, also called a non-hydraulic or semi-rigid implant, consists of two silicone rubber rods. The rods provide some rigidity. They are also flexible, so the penis can both curve downward in its normal position and become straight for sexual intercourse. Inflatable penile implant   An inflatable penile implant, also called a hydraulic implant, consists of cylinders, a pump, and a reservoir. The cylinders can be inflated with a fluid that helps to create an erection, and they can be deflated after intercourse. There are several types of inflatable implants. Tell a health care provider about:  Any allergies you have.  All medicines you are taking, including vitamins, herbs, eye drops, creams, and over-the-counter medicines.  Any problems you or family members have had with anesthetic medicines.  Any blood disorders you have.  Any surgeries you have had.  Any medical conditions you have. What are the risks? Generally, this is a safe procedure. However, problems may occur, including:  Infection in the penis. If this happens, the implant may need to be removed.  Bleeding.  Allergic reaction to medicines.  Damage to other structures or organs, such as the tube that drains urine from the body (urethra).  Not enough blood reaching the penis. This is rare. If this happens, the implant will need to be removed. What happens before the procedure? Medicines   Ask your health care provider about:  Changing or stopping your regular medicines. This is especially important if you are taking diabetes medicines or blood thinners.  Taking medicines such as aspirin and ibuprofen.  These medicines can thin your blood. Do not take these medicines before your procedure if your health care provider instructs you not to. Staying hydrated  Follow instructions from your health care provider about hydration, which may include:  Up to 2 hours before the procedure - you may continue to drink clear liquids, such as water, clear fruit juice, black coffee, and plain tea. Eating and drinking restrictions  Follow instructions from your health care provider about eating and drinking, which may include:  8 hours before the procedure - stop eating heavy meals or foods such as meat, fried foods, or fatty foods.  6 hours before the procedure - stop eating light meals or foods, such as toast or cereal.  6 hours before the procedure - stop drinking milk or drinks that contain milk.  2 hours before the procedure - stop drinking clear liquids. General instructions   You may be asked to shower with a germ-killing soap.  Plan to have someone take you home from the hospital or clinic.  If you will be going home right after the procedure, plan to have someone with you for 24 hours. What happens during the procedure?  To lower your risk of infection:  Your health care team will wash or sanitize their hands.  Hair may be removed from the surgical area.  Your skin will be washed with soap.  You may be given antibiotic medicine.  An IV tube will be inserted into one of your veins.  You will be given one or more of the following:  A medicine to make you fall asleep (general anesthetic).  A medicine that is injected into your spine to numb  the area below and slightly above the injection site (spinal anesthetic).  A flexible tube (catheter) may be inserted into your urethra and bladder. The catheter drains urine during the procedure and helps your surgeon easily locate your urethra.  A small incision will be made in your scrotum or in your penis, just below the head of your  penis.  The cylinders of the prosthesis will be put into tissue on each side of your penis.  If you will have an inflatable penile implant:  Incisions will be made in your abdomen and in your scrotum. These incisions will be used to insert the pump and the reservoir.  The cylinders, reservoir, and pump will be joined by tubes and tested.  Your incision(s) will be closed with dissolvable stitches (sutures).  A bandage (dressing) will be applied to your incision(s).  You may be fitted with a device similar to a jock strap or underwear with a supportive pouch (scrotal support) to relieve pressure on the incision area. The procedure may vary among health care providers and hospitals. What happens after the procedure?  Your blood pressure, heart rate, breathing rate, and blood oxygen level will be monitored until the medicines you were given have worn off.  If you have a catheter in place, it may stay in place for the day after the procedure.  You may be given antibiotics or pain medicines as needed.  You may need to follow a clear liquid diet for the first 24 hours after the procedure.  You may be encouraged to sit up and walk around.  A towel roll or an ice pack may be placed under your scrotum to help reduce swelling.  Do not drive for 24 hours if you were given a sedative. Summary  Penile prosthesis implantation is a procedure to put a device that treats erectile dysfunction into the penis.  There are two main types of devices that can be put in during the procedure: malleable penile implants and inflatable penile implants.  After the procedure, you may be fitted with a device similar to a jock strap or underwear with a supportive pouch (scrotal support) to relieve pressure on the incision area. This information is not intended to replace advice given to you by your health care provider. Make sure you discuss any questions you have with your health care provider. Document  Released: 10/16/2007 Document Revised: 04/20/2016 Document Reviewed: 04/20/2016 Elsevier Interactive Patient Education  2017 [employer].

## 2016-11-23 NOTE — Discharge Summary (Signed)
Physician Discharge Summary  Patient ID: Derrick Walker MRN: [id number] DOB/AGE: 08/08/1955 61 y.o.  Admit date: 11/19/2016 Discharge date: 11/20/2016  Admission Diagnoses: Erectile dysfunction Discharge Diagnoses:  Active Problems:   Erectile dysfunction   Discharged Condition: good  Hospital Course: The patient tolerated the procedure well and was transferred to the floor on IV pain meds, IV fluid. On POD#1 foley was removed, pt was started on a regular diet and they ambulated in the halls.  Prior to discharge the pt was tolerating a regular diet, pain was controlled on PO pain meds, they were ambulating without difficulty, and they had normal bowel function.  Consults: None  Significant Diagnostic Studies: none  Treatments: surgery: IPP placement  Discharge Exam: Blood pressure 120/63, pulse 76, temperature 98.5 F (36.9 C), temperature source Oral, resp. rate 18, height 5\' 11"  (1.803 m), weight 104.8 kg (231 lb), SpO2 100 %. General appearance: alert, cooperative and appears stated age Head: Normocephalic, without obvious abnormality, atraumatic Nose: Nares normal. Septum midline. Mucosa normal. No drainage or sinus tenderness. Neck: no adenopathy, no carotid bruit, no JVD, supple, symmetrical, trachea midline and thyroid not enlarged, symmetric, no tenderness/mass/nodules Resp: clear to auscultation bilaterally Cardio: regular rate and rhythm, S1, S2 normal, no murmur, click, rub or gallop GI: soft, non-tender; bowel sounds normal; no masses,  no organomegaly Extremities: extremities normal, atraumatic, no cyanosis or edema Neurologic: Grossly normal  Disposition: 01-Home or Self Care  Discharge Instructions    Discharge patient    Complete by:  As directed    Discharge disposition:  01-Home or Self Care   Discharge patient date:  11/20/2016     Allergies as of 11/20/2016      Reactions   Shellfish Allergy Anaphylaxis   Losartan    Kidney pain, leg swelling   Oxycodone Hives   percocet      Medication List    TAKE these medications   canagliflozin 100 MG Tabs tablet Commonly known as:  INVOKANA Take 1 tablet (100 mg total) by mouth daily before breakfast.   EPINEPHrine 0.3 mg/0.3 mL Soaj injection Commonly known as:  EPI-PEN Inject into the muscle as needed.   hydrochlorothiazide 25 MG tablet Commonly known as:  HYDRODIURIL Take 1 tablet (25 mg total) by mouth daily.   HYDROcodone-acetaminophen 5-325 MG tablet Commonly known as:  NORCO/VICODIN Take 1-2 tablets by mouth every 4 (four) hours as needed for moderate pain.   metFORMIN 1000 MG tablet Commonly known as:  GLUCOPHAGE Take 1 tablet (1,000 mg total) by mouth 2 (two) times daily with a meal.   sulfamethoxazole-trimethoprim 800-160 MG tablet Commonly known as:  BACTRIM DS,SEPTRA DS Take 1 tablet by mouth 2 (two) times daily.      Follow-up Information    Bree Fredrich Romans, NP. Call in 2 week(s).   Specialty:  Urology Contact information: [place]  [id number] [phone]           Signed: Nicolette Bang 11/23/2016, 3:33 PM

## 2016-12-04 DIAGNOSIS — N5231 Erectile dysfunction following radical prostatectomy: Secondary | ICD-10-CM | POA: Diagnosis not present

## 2016-12-07 ENCOUNTER — Telehealth: Payer: Self-pay

## 2016-12-24 NOTE — Progress Notes (Signed)
Subjective:    Patient ID: Derrick Walker, male    DOB: 06-02-1956, 61 y.o.   MRN: [id number]  HPI He is here for follow up.  After his urological surgery ( penile prosthesis) he noticed a lump in his right groin.  He did ask his urologist about it, but he did not give him an answer.  The reservoir is on the left side. He is healing well from surgery.  He has intermittent discomfort in the right groin. He can feel the lump intermittently - it is the size of a 1/2 dollar.   When he is walking he can feel something in that area.  He denies anything else that makes the lump or discomfort more prominent.     Medications and allergies reviewed with patient and updated if appropriate.  Patient Active Problem List   Diagnosis Date Noted  . Erectile dysfunction 11/19/2016  . Diabetes ([place]) 12/15/2015  . Malignant neoplasm of prostate ([place]) 04/09/2014  . Abnormal finding on EKG 09/17/2013  . LVH (left ventricular hypertrophy) - an EKG 09/17/2013  . Hypertension, essential   . Mild obesity     Current Outpatient Prescriptions on File Prior to Visit  Medication Sig Dispense Refill  . canagliflozin (INVOKANA) 100 MG TABS tablet Take 1 tablet (100 mg total) by mouth daily before breakfast. 30 tablet 5  . EPINEPHrine 0.3 mg/0.3 mL IJ SOAJ injection Inject into the muscle as needed.    . hydrochlorothiazide (HYDRODIURIL) 25 MG tablet Take 1 tablet (25 mg total) by mouth daily. 90 tablet 3  . HYDROcodone-acetaminophen (NORCO/VICODIN) 5-325 MG tablet Take 1-2 tablets by mouth every 4 (four) hours as needed for moderate pain. 30 tablet 0  . metFORMIN (GLUCOPHAGE) 1000 MG tablet Take 1 tablet (1,000 mg total) by mouth 2 (two) times daily with a meal. 180 tablet 3   No current facility-administered medications on file prior to visit.     Past Medical History:  Diagnosis Date  . Erectile dysfunction   . History of prostate cancer urologist-  dr Orpah Melter   dx 05/ 2015 -- moderate risk  Stage T2c, Gleason 3+4,  vol 152.41cc---  09/ 2015 s/p  radical prostatectomy -- per pt last PSA 07-15-2016  undectable  . Hypertension, essential   . Type 2 diabetes mellitus ([place])     Past Surgical History:  Procedure Laterality Date  . LYMPHADENECTOMY Bilateral 04/09/2014   Procedure: BILATERAL PELVIC LYMPH NODE DISSECTION;  Surgeon: Alexis Frock, MD;  Location: [facility];  Service: Urology;  Laterality: Bilateral;  . PENILE PROSTHESIS IMPLANT N/A 11/19/2016   Procedure: AMS PENILE PROTHESIS INFLATABLE;  Surgeon: Cleon Gustin, MD;  Location: [facility];  Service: Urology;  Laterality: N/A;  . PROSTATE BIOPSY N/A 12/11/2013   Procedure: BIOPSY TRANSRECTAL ULTRASONIC PROSTATE (TUBP);  Surgeon: Arvil Persons, MD;  Location: [facility];  Service: Urology;  Laterality: N/A;  . ROBOT ASSISTED LAPAROSCOPIC RADICAL PROSTATECTOMY N/A 04/09/2014   Procedure: ROBOTIC ASSISTED LAPAROSCOPIC RADICAL PROSTATECTOMY, INDOCYANINE GREEN DYE ;  Surgeon: Alexis Frock, MD;  Location: [facility];  Service: Urology;  Laterality: N/A;  . TRANSTHORACIC ECHOCARDIOGRAM  10-14-2013   dr Shanon Brow harding   mild LVH/  ef  15-17%/  grade I diastolic dysfunction/  mild LAE    Social History   Social History  . Marital status: Single    Spouse name: N/A  . Number of children: 3  . Years of education: N/A  Occupational History  .  [employer]   Social History Main Topics  . Smoking status: Never Smoker  . Smokeless tobacco: Never Used  . Alcohol use No  . Drug use: No  . Sexual activity: Not on file   Other Topics Concern  . Not on file   Social History Narrative   He is a divorced father of 3. 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 He has finished his Masters is now working on his PhD. He never smoked. He drinks about 3 glasses of wine with food a week. He uses to help digest food.   He is Jewish and  into [employer] does not eat any pork/ham and only a small amount of beef.   Contact information:   Son: Baine Decesare: [phone]   Daughter: Kaliel Bolds: [phone]    Family History  Problem Relation Age of Onset  . Hypertension Mother   . Hypertension Maternal Grandmother   . Hypertension Paternal Grandmother   . Kidney failure Father   . Hypertension Brother   . Colon cancer Neg Hx   . Colon polyps Neg Hx   . Esophageal cancer Neg Hx   . Rectal cancer Neg Hx   . Stomach cancer Neg Hx     Review of Systems  Constitutional: Negative for chills and fever.  Gastrointestinal: Negative for abdominal pain, constipation, diarrhea and nausea.  Genitourinary: Negative for dysuria.       Objective:   Vitals:   12/25/16 1123  BP: 138/88  Pulse: 87  Resp: 16  Temp: 98 F (36.7 C)   Filed Weights   12/25/16 1123  Weight: 242 lb (109.8 kg)   Body mass index is 33.75 kg/m.  Wt Readings from Last 3 Encounters:  12/25/16 242 lb (109.8 kg)  11/19/16 231 lb (104.8 kg)  10/05/16 245 lb 8 oz (111.4 kg)     Physical Exam  Constitutional: He appears well-developed and well-nourished. No distress.  Abdominal: Soft. He exhibits no distension and no mass. There is no tenderness. There is no rebound and no guarding.  Genitourinary:  Genitourinary Comments: Right suprapubic/groin region with palpable lump that is non tender, no skin changes  Skin: Skin is warm and dry. He is not diaphoretic. No erythema.          Assessment & Plan:   See Problem List for Assessment and Plan of chronic medical problems.

## 2016-12-25 ENCOUNTER — Ambulatory Visit (INDEPENDENT_AMBULATORY_CARE_PROVIDER_SITE_OTHER): Payer: Medicare HMO | Admitting: Internal Medicine

## 2016-12-25 ENCOUNTER — Encounter: Payer: Self-pay | Admitting: Internal Medicine

## 2016-12-25 VITALS — BP 138/88 | HR 87 | Temp 98.0°F | Resp 16 | Wt 242.0 lb

## 2016-12-25 DIAGNOSIS — R1903 Right lower quadrant abdominal swelling, mass and lump: Secondary | ICD-10-CM

## 2016-12-25 DIAGNOSIS — K4090 Unilateral inguinal hernia, without obstruction or gangrene, not specified as recurrent: Secondary | ICD-10-CM

## 2016-12-25 NOTE — Assessment & Plan Note (Signed)
Likely right inguinal hernia Will get a Ct scan to confirm and refer to surgery for further evaluation and consideration of possible surgery

## 2016-12-25 NOTE — Patient Instructions (Signed)
A CT scan of your pelvis was ordered to evaluate the possible hernia.    A referral was ordered for surgery.

## 2016-12-26 NOTE — Telephone Encounter (Signed)
error 

## 2017-01-02 ENCOUNTER — Inpatient Hospital Stay: Admission: RE | Admit: 2017-01-02 | Payer: Self-pay | Source: Ambulatory Visit

## 2017-01-03 ENCOUNTER — Ambulatory Visit (INDEPENDENT_AMBULATORY_CARE_PROVIDER_SITE_OTHER)
Admission: RE | Admit: 2017-01-03 | Discharge: 2017-01-03 | Disposition: A | Discharge status: Home / Self Care | Payer: Medicare HMO | Source: Ambulatory Visit | Attending: Internal Medicine | Admitting: Internal Medicine

## 2017-01-03 ENCOUNTER — Encounter: Payer: Self-pay | Admitting: Internal Medicine

## 2017-01-03 DIAGNOSIS — R1909 Other intra-abdominal and pelvic swelling, mass and lump: Secondary | ICD-10-CM | POA: Diagnosis not present

## 2017-01-03 DIAGNOSIS — K4090 Unilateral inguinal hernia, without obstruction or gangrene, not specified as recurrent: Secondary | ICD-10-CM

## 2017-01-03 DIAGNOSIS — R1031 Right lower quadrant pain: Secondary | ICD-10-CM

## 2017-01-03 HISTORY — DX: Right lower quadrant pain: R10.31

## 2017-01-04 ENCOUNTER — Other Ambulatory Visit: Payer: Self-pay | Admitting: Urology

## 2017-01-04 DIAGNOSIS — N5231 Erectile dysfunction following radical prostatectomy: Secondary | ICD-10-CM | POA: Diagnosis not present
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
## 2017-01-07 ENCOUNTER — Encounter (HOSPITAL_BASED_OUTPATIENT_CLINIC_OR_DEPARTMENT_OTHER): Payer: Self-pay | Admitting: *Deleted

## 2017-01-07 NOTE — Progress Notes (Signed)
NPO AFTER MN W/ EXCEPTION CLEAR LIQUIDS UNTIL 0700 (NO CREAM [employer]).  ARRIVE AT 1130.  NEEDS ISTAT 8.  CURRENT EKG IN CHART AND EPIC.  WILL TAKE TRAMADOL/ TYLENOL IF NEEDED AM DOS W/ SIPS OF WATER.

## 2017-01-09 ENCOUNTER — Other Ambulatory Visit: Payer: Self-pay

## 2017-01-09 NOTE — Progress Notes (Signed)
Pt here requesting review of preop instructions.  preop instructions restated as given by [name] on 6/18. Pt wrote  instructions  In his personal calendar.

## 2017-01-10 NOTE — Progress Notes (Signed)
CALLED VIA PHONE AND LEFT MESSAGE TO ARRIVE DOS 01-14-2017 AT 1030 , NOT 1130.  DUE TO CLARIFICATION GIVEN BY DR MCKENZIE ABOUT PRE-OP IV ANTIBIOTICS TO BE GIVEN AND COMPLETED PRIOR TO SURGERY.  ASKED PT TO CALL BACK TO CONFIRM HE RECEIVED THIS MESSAGE.

## 2017-01-14 ENCOUNTER — Ambulatory Visit (HOSPITAL_BASED_OUTPATIENT_CLINIC_OR_DEPARTMENT_OTHER): Payer: Medicare HMO | Admitting: Anesthesiology

## 2017-01-14 ENCOUNTER — Encounter (HOSPITAL_COMMUNITY)
Admission: RE | Disposition: A | Discharge status: Home / Self Care | Payer: Self-pay | Source: Ambulatory Visit | Attending: Urology

## 2017-01-14 ENCOUNTER — Observation Stay (HOSPITAL_BASED_OUTPATIENT_CLINIC_OR_DEPARTMENT_OTHER)
Admission: RE | Admit: 2017-01-14 | Discharge: 2017-01-15 | Disposition: A | Discharge status: Home / Self Care | Payer: Medicare HMO | Source: Ambulatory Visit | Attending: Urology | Admitting: Urology

## 2017-01-14 ENCOUNTER — Encounter (HOSPITAL_BASED_OUTPATIENT_CLINIC_OR_DEPARTMENT_OTHER): Payer: Self-pay

## 2017-01-14 DIAGNOSIS — E119 Type 2 diabetes mellitus without complications: Secondary | ICD-10-CM | POA: Diagnosis not present

## 2017-01-14 DIAGNOSIS — Z91013 Allergy to seafood: Secondary | ICD-10-CM | POA: Insufficient documentation

## 2017-01-14 DIAGNOSIS — K4030 Unilateral inguinal hernia, with obstruction, without gangrene, not specified as recurrent: Secondary | ICD-10-CM

## 2017-01-14 DIAGNOSIS — I10 Essential (primary) hypertension: Secondary | ICD-10-CM | POA: Insufficient documentation

## 2017-01-14 DIAGNOSIS — Z7984 Long term (current) use of oral hypoglycemic drugs: Secondary | ICD-10-CM | POA: Diagnosis not present

## 2017-01-14 DIAGNOSIS — Z79899 Other long term (current) drug therapy: Secondary | ICD-10-CM | POA: Insufficient documentation

## 2017-01-14 DIAGNOSIS — Z8546 Personal history of malignant neoplasm of prostate: Secondary | ICD-10-CM | POA: Insufficient documentation

## 2017-01-14 DIAGNOSIS — C61 Malignant neoplasm of prostate: Secondary | ICD-10-CM | POA: Diagnosis not present

## 2017-01-14 DIAGNOSIS — K4090 Unilateral inguinal hernia, without obstruction or gangrene, not specified as recurrent: Principal | ICD-10-CM

## 2017-01-14 DIAGNOSIS — N529 Male erectile dysfunction, unspecified: Secondary | ICD-10-CM | POA: Diagnosis not present

## 2017-01-14 DIAGNOSIS — T83490A Other mechanical complication of penile (implanted) prosthesis, initial encounter: Secondary | ICD-10-CM | POA: Diagnosis not present

## 2017-01-14 DIAGNOSIS — Z885 Allergy status to narcotic agent status: Secondary | ICD-10-CM | POA: Insufficient documentation

## 2017-01-14 HISTORY — PX: INGUINAL HERNIA REPAIR: SHX194

## 2017-01-14 HISTORY — PX: PENILE PROSTHESIS IMPLANT: SHX240

## 2017-01-14 HISTORY — DX: Right lower quadrant pain: R10.31

## 2017-01-14 HISTORY — DX: Unilateral inguinal hernia, without obstruction or gangrene, not specified as recurrent: K40.90

## 2017-01-14 LAB — POCT I-STAT, CHEM 8
BUN: 18 mg/dL (ref 6–20)
Calcium, Ion: 1.43 mmol/L — ABNORMAL HIGH (ref 1.15–1.40)
Chloride: 103 mmol/L (ref 101–111)
Creatinine, Ser: 1.2 mg/dL (ref 0.61–1.24)
Glucose, Bld: 104 mg/dL — ABNORMAL HIGH (ref 65–99)
HEMATOCRIT: 40 % (ref 39.0–52.0)
HEMOGLOBIN: 13.6 g/dL (ref 13.0–17.0)
Potassium: 4.1 mmol/L (ref 3.5–5.1)
Sodium: 144 mmol/L (ref 135–145)
TCO2: 29 mmol/L (ref 0–100)

## 2017-01-14 LAB — CBC
HEMATOCRIT: 41.6 % (ref 39.0–52.0)
HEMOGLOBIN: 14.1 g/dL (ref 13.0–17.0)
MCH: 30.4 pg (ref 26.0–34.0)
MCHC: 33.9 g/dL (ref 30.0–36.0)
MCV: 89.7 fL (ref 78.0–100.0)
Platelets: 255 10*3/uL (ref 150–400)
RBC: 4.64 MIL/uL (ref 4.22–5.81)
RDW: 14.1 % (ref 11.5–15.5)
WBC: 11.8 10*3/uL — ABNORMAL HIGH (ref 4.0–10.5)

## 2017-01-14 LAB — BASIC METABOLIC PANEL
Anion gap: 10 (ref 5–15)
BUN: 15 mg/dL (ref 6–20)
CALCIUM: 10.2 mg/dL (ref 8.9–10.3)
CHLORIDE: 105 mmol/L (ref 101–111)
CO2: 25 mmol/L (ref 22–32)
CREATININE: 1.25 mg/dL — AB (ref 0.61–1.24)
GFR calc non Af Amer: >60 mL/min (ref >60)
Glucose, Bld: 162 mg/dL — ABNORMAL HIGH (ref 65–99)
Potassium: 3.5 mmol/L (ref 3.5–5.1)
SODIUM: 140 mmol/L (ref 135–145)

## 2017-01-14 LAB — GLUCOSE, CAPILLARY
Glucose-Capillary: 104 mg/dL — ABNORMAL HIGH (ref 65–99)
Glucose-Capillary: 154 mg/dL — ABNORMAL HIGH (ref 65–99)

## 2017-01-14 SURGERY — REPAIR, HERNIA, INGUINAL, ADULT
Anesthesia: General | Site: Inguinal | Laterality: Right

## 2017-01-14 MED ORDER — ZOLPIDEM TARTRATE 5 MG PO TABS
5.0000 mg | ORAL_TABLET | Freq: Every evening | ORAL | Status: DC | PRN
  Administered 2017-01-14: 5 mg via ORAL
  Filled 2017-01-14: qty 1

## 2017-01-14 MED ORDER — LIDOCAINE 2% (20 MG/ML) 5 ML SYRINGE
INTRAMUSCULAR | Status: DC | PRN
  Administered 2017-01-14: 100 mg via INTRAVENOUS

## 2017-01-14 MED ORDER — BUPIVACAINE LIPOSOME 1.3 % IJ SUSP
INTRAMUSCULAR | Status: AC
Start: 2017-01-14 — End: ?
  Filled 2017-01-14: qty 20

## 2017-01-14 MED ORDER — INSULIN ASPART 100 UNIT/ML ~~LOC~~ SOLN
0.0000 [IU] | Freq: Three times a day (TID) | SUBCUTANEOUS | Status: DC
  Administered 2017-01-15: 2 [IU] via SUBCUTANEOUS

## 2017-01-14 MED ORDER — FENTANYL CITRATE (PF) 100 MCG/2ML IJ SOLN
INTRAMUSCULAR | Status: AC
  Filled 2017-01-14: qty 2

## 2017-01-14 MED ORDER — VANCOMYCIN HCL IN DEXTROSE 750-5 MG/150ML-% IV SOLN
750.0000 mg | Freq: Two times a day (BID) | INTRAVENOUS | Status: DC
  Administered 2017-01-14 – 2017-01-15 (×2): 750 mg via INTRAVENOUS
  Filled 2017-01-14 (×2): qty 150

## 2017-01-14 MED ORDER — GENTAMICIN SULFATE 40 MG/ML IJ SOLN
440.0000 mg | INTRAVENOUS | Status: AC
  Administered 2017-01-14: 440 mg via INTRAVENOUS
  Filled 2017-01-14: qty 11

## 2017-01-14 MED ORDER — ROCURONIUM BROMIDE 50 MG/5ML IV SOSY
PREFILLED_SYRINGE | INTRAVENOUS | Status: AC
  Filled 2017-01-14: qty 5

## 2017-01-14 MED ORDER — LIDOCAINE 2% (20 MG/ML) 5 ML SYRINGE
INTRAMUSCULAR | Status: AC
Start: 2017-01-14 — End: ?
  Filled 2017-01-14: qty 5

## 2017-01-14 MED ORDER — MIDAZOLAM HCL 2 MG/2ML IJ SOLN
INTRAMUSCULAR | Status: AC
  Filled 2017-01-14: qty 2

## 2017-01-14 MED ORDER — PROPOFOL 10 MG/ML IV BOLUS
INTRAVENOUS | Status: DC | PRN
  Administered 2017-01-14: 200 mg via INTRAVENOUS

## 2017-01-14 MED ORDER — BUPIVACAINE LIPOSOME 1.3 % IJ SUSP
INTRAMUSCULAR | Status: DC | PRN
  Administered 2017-01-14: 20 mL

## 2017-01-14 MED ORDER — FENTANYL CITRATE (PF) 100 MCG/2ML IJ SOLN
INTRAMUSCULAR | Status: DC | PRN
  Administered 2017-01-14 (×4): 50 ug via INTRAVENOUS

## 2017-01-14 MED ORDER — HYDROCODONE-ACETAMINOPHEN 5-325 MG PO TABS
1.0000 | ORAL_TABLET | ORAL | Status: DC | PRN
  Administered 2017-01-15 (×2): 2 via ORAL
  Filled 2017-01-14 (×2): qty 2

## 2017-01-14 MED ORDER — MIDAZOLAM HCL 5 MG/5ML IJ SOLN
INTRAMUSCULAR | Status: DC | PRN
  Administered 2017-01-14: 2 mg via INTRAVENOUS

## 2017-01-14 MED ORDER — ROCURONIUM BROMIDE 10 MG/ML (PF) SYRINGE
PREFILLED_SYRINGE | INTRAVENOUS | Status: DC | PRN
  Administered 2017-01-14: 50 mg via INTRAVENOUS

## 2017-01-14 MED ORDER — DIPHENHYDRAMINE HCL 12.5 MG/5ML PO ELIX: 12.5000 mg | ORAL_SOLUTION | Freq: Four times a day (QID) | ORAL | Status: DC | PRN

## 2017-01-14 MED ORDER — SUGAMMADEX SODIUM 200 MG/2ML IV SOLN
INTRAVENOUS | Status: DC | PRN
  Administered 2017-01-14: 200 mg via INTRAVENOUS

## 2017-01-14 MED ORDER — PROPOFOL 10 MG/ML IV BOLUS
INTRAVENOUS | Status: AC
  Filled 2017-01-14: qty 40

## 2017-01-14 MED ORDER — ONDANSETRON HCL 4 MG/2ML IJ SOLN: 4.0000 mg | INTRAMUSCULAR | Status: DC | PRN

## 2017-01-14 MED ORDER — GENTAMICIN SULFATE 40 MG/ML IJ SOLN
5.0000 mg/kg | INTRAMUSCULAR | Status: DC
  Filled 2017-01-14: qty 11.25

## 2017-01-14 MED ORDER — ACETAMINOPHEN 500 MG PO TABS
ORAL_TABLET | ORAL | Status: AC
  Filled 2017-01-14: qty 2

## 2017-01-14 MED ORDER — LACTATED RINGERS IV SOLN
INTRAVENOUS | Status: DC
  Administered 2017-01-14 (×2): via INTRAVENOUS
  Filled 2017-01-14: qty 1000

## 2017-01-14 MED ORDER — VANCOMYCIN HCL 10 G IV SOLR
1500.0000 mg | INTRAVENOUS | Status: AC
  Administered 2017-01-14: 1500 mg via INTRAVENOUS
  Filled 2017-01-14 (×2): qty 1500

## 2017-01-14 MED ORDER — GENTAMICIN SULFATE 40 MG/ML IJ SOLN
5.0000 mg/kg | INTRAVENOUS | Status: DC
  Filled 2017-01-14: qty 13.75

## 2017-01-14 MED ORDER — DIPHENHYDRAMINE HCL 50 MG/ML IJ SOLN: 12.5000 mg | Freq: Four times a day (QID) | INTRAMUSCULAR | Status: DC | PRN

## 2017-01-14 MED ORDER — HYDROMORPHONE HCL 1 MG/ML IJ SOLN
0.5000 mg | INTRAMUSCULAR | Status: DC | PRN
  Administered 2017-01-14: 1 mg via INTRAVENOUS
  Filled 2017-01-14: qty 1

## 2017-01-14 MED ORDER — BUPIVACAINE HCL (PF) 0.25 % IJ SOLN
INTRAMUSCULAR | Status: DC | PRN
  Administered 2017-01-14: 30 mL

## 2017-01-14 MED ORDER — ACETAMINOPHEN 500 MG PO TABS
1000.0000 mg | ORAL_TABLET | Freq: Once | ORAL | Status: AC
  Administered 2017-01-14: 1000 mg via ORAL
  Filled 2017-01-14: qty 2

## 2017-01-14 MED ORDER — BELLADONNA ALKALOIDS-OPIUM 16.2-60 MG RE SUPP: 1.0000 | Freq: Four times a day (QID) | RECTAL | Status: DC | PRN

## 2017-01-14 MED ORDER — DEXAMETHASONE SODIUM PHOSPHATE 10 MG/ML IJ SOLN
INTRAMUSCULAR | Status: DC | PRN
  Administered 2017-01-14: 10 mg via INTRAVENOUS

## 2017-01-14 MED ORDER — HYDROMORPHONE HCL 1 MG/ML IJ SOLN
INTRAMUSCULAR | Status: AC
  Filled 2017-01-14: qty 0.5

## 2017-01-14 MED ORDER — ONDANSETRON HCL 4 MG/2ML IJ SOLN
INTRAMUSCULAR | Status: DC | PRN
Start: 2017-01-14 — End: 2017-01-14
  Administered 2017-01-14: 4 mg via INTRAVENOUS

## 2017-01-14 MED ORDER — DEXAMETHASONE SODIUM PHOSPHATE 10 MG/ML IJ SOLN
INTRAMUSCULAR | Status: AC
Start: 2017-01-14 — End: ?
  Filled 2017-01-14: qty 1

## 2017-01-14 MED ORDER — HYDROMORPHONE HCL 1 MG/ML IJ SOLN
0.2500 mg | INTRAMUSCULAR | Status: DC | PRN
  Administered 2017-01-14 (×3): 0.5 mg via INTRAVENOUS
  Filled 2017-01-14: qty 0.5

## 2017-01-14 MED ORDER — PROMETHAZINE HCL 25 MG/ML IJ SOLN
6.2500 mg | INTRAMUSCULAR | Status: DC | PRN
  Filled 2017-01-14: qty 1

## 2017-01-14 MED ORDER — ACETAMINOPHEN 325 MG PO TABS
650.0000 mg | ORAL_TABLET | ORAL | Status: DC | PRN
  Administered 2017-01-15: 650 mg via ORAL
  Filled 2017-01-14: qty 2

## 2017-01-14 MED ORDER — SODIUM CHLORIDE 0.9 % IV SOLN
INTRAVENOUS | Status: DC
  Administered 2017-01-14: 19:00:00 via INTRAVENOUS

## 2017-01-14 MED ORDER — ONDANSETRON HCL 4 MG/2ML IJ SOLN
INTRAMUSCULAR | Status: AC
  Filled 2017-01-14: qty 2

## 2017-01-14 SURGICAL SUPPLY — 66 items
AMS 700 CX accessory kit ×2 IMPLANT
APPLICATOR COTTON TIP 6IN STRL (MISCELLANEOUS) IMPLANT
BAG URINE DRAINAGE (UROLOGICAL SUPPLIES) IMPLANT
BLADE CLIPPER SURG (BLADE) ×2 IMPLANT
BLADE SURG 15 STRL LF DISP TIS (BLADE) ×1 IMPLANT
BLADE SURG 15 STRL SS (BLADE) ×1
CATH FOLEY 2WAY SLVR  5CC 16FR (CATHETERS)
CATH FOLEY 2WAY SLVR 5CC 16FR (CATHETERS) IMPLANT
COVER BACK TABLE 60X90IN (DRAPES) ×2 IMPLANT
COVER MAYO STAND STRL (DRAPES) ×4 IMPLANT
COVER PROBE W GEL 5X96 (DRAPES) IMPLANT
DERMABOND ADVANCED (GAUZE/BANDAGES/DRESSINGS) ×1
DERMABOND ADVANCED .7 DNX12 (GAUZE/BANDAGES/DRESSINGS) ×1 IMPLANT
DISSECTOR ROUND CHERRY 3/8 STR (MISCELLANEOUS) IMPLANT
DRAIN PENROSE 18X1/2 LTX STRL (DRAIN) IMPLANT
DRAPE LAPAROTOMY 100X72 PEDS (DRAPES) ×2 IMPLANT
DRSG TEGADERM 4X4.75 (GAUZE/BANDAGES/DRESSINGS) IMPLANT
ELECT NEEDLE TIP 2.8 STRL (NEEDLE) IMPLANT
ELECT REM PT RETURN 9FT ADLT (ELECTROSURGICAL) ×2
ELECTRODE REM PT RTRN 9FT ADLT (ELECTROSURGICAL) ×1 IMPLANT
GAUZE SPONGE 4X4 16PLY XRAY LF (GAUZE/BANDAGES/DRESSINGS) ×4 IMPLANT
GLOVE BIO SURGEON STRL SZ 6.5 (GLOVE) ×2 IMPLANT
GLOVE BIO SURGEON STRL SZ8 (GLOVE) ×2 IMPLANT
GLOVE BIOGEL PI IND STRL 6.5 (GLOVE) ×1 IMPLANT
GLOVE BIOGEL PI IND STRL 7.5 (GLOVE) ×3 IMPLANT
GLOVE BIOGEL PI INDICATOR 6.5 (GLOVE) ×1
GLOVE BIOGEL PI INDICATOR 7.5 (GLOVE) ×3
GOWN STRL REUS W/ TWL LRG LVL3 (GOWN DISPOSABLE) ×2 IMPLANT
GOWN STRL REUS W/ TWL XL LVL3 (GOWN DISPOSABLE) ×1 IMPLANT
GOWN STRL REUS W/TWL LRG LVL3 (GOWN DISPOSABLE) ×2
GOWN STRL REUS W/TWL XL LVL3 (GOWN DISPOSABLE) ×1
HOLDER FOLEY CATH W/STRAP (MISCELLANEOUS) ×2 IMPLANT
KIT ACCESSORY AMS 700 PUMP (UROLOGICAL SUPPLIES) ×2 IMPLANT
KIT RM TURNOVER CYSTO AR (KITS) ×2 IMPLANT
MANIFOLD NEPTUNE II (INSTRUMENTS) IMPLANT
NEEDLE HYPO 25X1 1.5 SAFETY (NEEDLE) ×2 IMPLANT
NS IRRIG 500ML POUR BTL (IV SOLUTION) ×2 IMPLANT
PACK BASIN DAY SURGERY FS (CUSTOM PROCEDURE TRAY) ×2 IMPLANT
PENCIL BUTTON HOLSTER BLD 10FT (ELECTRODE) ×2 IMPLANT
SUPPORT SCROTAL LG STRP (MISCELLANEOUS) IMPLANT
SUT CHROMIC 3 0 SH 27 (SUTURE) IMPLANT
SUT CHROMIC 4 0 PS 2 18 (SUTURE) IMPLANT
SUT CHROMIC 4 0 SH 27 (SUTURE) IMPLANT
SUT CHROMIC GUT AB #0 18 (SUTURE) IMPLANT
SUT MNCRL AB 4-0 PS2 18 (SUTURE) IMPLANT
SUT PROLENE 0 CT 1 CR/8 (SUTURE) ×2 IMPLANT
SUT PROLENE 4 0 RB 1 (SUTURE)
SUT PROLENE 4-0 RB1 .5 CRCL 36 (SUTURE) IMPLANT
SUT SILK 0 SH 30 (SUTURE) IMPLANT
SUT SILK 0 TIES 10X30 (SUTURE) IMPLANT
SUT VIC AB 0 SH 27 (SUTURE) IMPLANT
SUT VIC AB 2-0 SH 27 (SUTURE) ×3
SUT VIC AB 2-0 SH 27X BRD (SUTURE) ×1 IMPLANT
SUT VIC AB 2-0 SH 27XBRD (SUTURE) ×2 IMPLANT
SUT VICRYL 2 0 18  UND BR (SUTURE)
SUT VICRYL 2 0 18 UND BR (SUTURE) IMPLANT
SYR 20CC LL (SYRINGE) ×2 IMPLANT
SYR 30ML LL (SYRINGE) IMPLANT
SYR BULB IRRIGATION 50ML (SYRINGE) ×2 IMPLANT
SYR CONTROL 10ML LL (SYRINGE) ×2 IMPLANT
TOWEL OR 17X24 6PK STRL BLUE (TOWEL DISPOSABLE) ×6 IMPLANT
TRAY DSU PREP LF (CUSTOM PROCEDURE TRAY) ×2 IMPLANT
TRAY FOLEY CATH SILVER 16FR (SET/KITS/TRAYS/PACK) ×2 IMPLANT
TUBE CONNECTING 12X1/4 (SUCTIONS) IMPLANT
WATER STERILE IRR 500ML POUR (IV SOLUTION) IMPLANT
YANKAUER SUCT BULB TIP NO VENT (SUCTIONS) IMPLANT

## 2017-01-14 NOTE — Progress Notes (Signed)
Pharmacy Antibiotic Note  Derrick Walker is a 61 y.o. male admitted on 01/14/2017 with inguinal pain.  PMH significant for prostate cancer, inguinal hernia, DM, HTN.   Patient underwent hernia repair today.  Received Vancomycin 1500mg  IV x 1 dose at 11:00 today and Gentamicin 440mg  IV x 1 dose @ 14:01 today.   Pharmacy has been consulted for Vancomycin and Gentamicin dosing for surgical prophylaxis.  Plan:  Vancomycin 750mg  IV q12h  Gentamicin 450mg  IV q24h (5mg /kg q24h using adjusted body weight)  Follow length of therapy  Height: 5\' 11"  (180.3 cm) Weight: 246 lb 14.6 oz (112 kg) IBW/kg (Calculated) : 75.3  Temp (24hrs), Avg:98.8 F (37.1 C), Min:98.4 F (36.9 C), Max:98.9 F (37.2 C)   Recent Labs Lab 01/14/17 1043  CREATININE 1.20    Estimated Creatinine Clearance: 84.4 mL/min (by C-G formula based on SCr of 1.2 mg/dL).    Allergies  Allergen Reactions  . Shellfish Allergy Anaphylaxis  . Losartan Other (See Comments)    Kidney pain, leg swelling  . Oxycodone Hives    percocet    Antimicrobials this admission: 6/25 Vanc >>   6/25 Norva Karvonen >>    Dose adjustments this admission:  Microbiology results: No cultures ordered  Thank you for allowing pharmacy to be a part of this patient's care.  Everette Rank, PharmD 01/14/2017 7:04 PM

## 2017-01-14 NOTE — Anesthesia Postprocedure Evaluation (Signed)
Anesthesia Post Note  Patient: Derrick Walker  Procedure(s) Performed: Procedure(s) (LRB): HERNIA REPAIR INGUINAL ADULT (Right) REVISION PENILE PROSTHESIS (Right)     Patient location during evaluation: PACU Anesthesia Type: General Level of consciousness: awake and alert Pain management: pain level controlled Vital Signs Assessment: post-procedure vital signs reviewed and stable Respiratory status: spontaneous breathing, nonlabored ventilation, respiratory function stable and patient connected to nasal cannula oxygen Cardiovascular status: blood pressure returned to baseline and stable Postop Assessment: no signs of nausea or vomiting Anesthetic complications: no    Last Vitals:  Vitals:   01/14/17 1700 01/14/17 1715  BP: (!) 150/86 (!) 149/87  Pulse: 74 65  Resp: 11 10  Temp:      Last Pain:  Vitals:   01/14/17 1715  TempSrc:   PainSc: 8                  Swara Donze

## 2017-01-14 NOTE — H&P (Signed)
Urology Admission H&P  Chief Complaint: right inguinal pain  History of Present Illness: [name] is a 61yo with a hx of ED s/p IPP placement and developed right inguinal pain. PT scan confirmed herniation of the IPP reservoir.  Past Medical History:  Diagnosis Date  . Erectile dysfunction   . History of prostate cancer urologist-  dr Orpah Melter   dx 05/ 2015 -- moderate risk Stage T2c, Gleason 3+4,  vol 152.41cc---  09/ 2015 s/p  radical prostatectomy -- per pt last PSA 07-15-2016  undectable  . Hypertension, essential   . Right inguinal hernia    from penile prosthesis reservior post surgery 11-19-2016  . Right inguinal pain   . Type 2 diabetes mellitus ([place])    Past Surgical History:  Procedure Laterality Date  . LYMPHADENECTOMY Bilateral 04/09/2014   Procedure: BILATERAL PELVIC LYMPH NODE DISSECTION;  Surgeon: Alexis Frock, MD;  Location: [facility];  Service: Urology;  Laterality: Bilateral;  . PENILE PROSTHESIS IMPLANT N/A 11/19/2016   Procedure: AMS PENILE PROTHESIS INFLATABLE;  Surgeon: Cleon Gustin, MD;  Location: [facility];  Service: Urology;  Laterality: N/A;  . PROSTATE BIOPSY N/A 12/11/2013   Procedure: BIOPSY TRANSRECTAL ULTRASONIC PROSTATE (TUBP);  Surgeon: Arvil Persons, MD;  [facility];  Service: Urology;  Laterality: N/A;  . ROBOT ASSISTED LAPAROSCOPIC RADICAL PROSTATECTOMY N/A 04/09/2014   Procedure: ROBOTIC ASSISTED LAPAROSCOPIC RADICAL PROSTATECTOMY, INDOCYANINE GREEN DYE ;  Surgeon: Alexis Frock, MD;  Location: [facility];  Service: Urology;  Laterality: N/A;  . TRANSTHORACIC ECHOCARDIOGRAM  10-14-2013   dr Shanon Brow harding   mild LVH/  ef  39-76%/  grade I diastolic dysfunction/  mild LAE    Home Medications:  Prescriptions Prior to Admission  Medication Sig Dispense Refill Last Dose  . acetaminophen (TYLENOL) 500 MG tablet Take 1,000 mg by mouth every 6 (six) hours as needed.   01/13/2017 at 1700  . canagliflozin  (INVOKANA) 100 MG TABS tablet Take 1 tablet (100 mg total) by mouth daily before breakfast. 30 tablet 5 01/13/2017 at 0700  . EPINEPHrine 0.3 mg/0.3 mL IJ SOAJ injection Inject into the muscle as needed.   Taking  . hydrochlorothiazide (HYDRODIURIL) 25 MG tablet Take 1 tablet (25 mg total) by mouth daily. 90 tablet 3 01/14/2017 at 0700  . metFORMIN (GLUCOPHAGE) 1000 MG tablet Take 1 tablet (1,000 mg total) by mouth 2 (two) times daily with a meal. 180 tablet 3 01/14/2017 at 0700  . traMADol (ULTRAM) 50 MG tablet Take 50 mg by mouth every 6 (six) hours as needed.   01/13/2017 at 1700  . ibuprofen (ADVIL,MOTRIN) 200 MG tablet Take 200 mg by mouth every 6 (six) hours as needed.   Unknown at Unknown time   Allergies:  Allergies  Allergen Reactions  . Shellfish Allergy Anaphylaxis  . Losartan Other (See Comments)    Kidney pain, leg swelling  . Oxycodone Hives    percocet    Family History  Problem Relation Age of Onset  . Hypertension Mother   . Hypertension Maternal Grandmother   . Hypertension Paternal Grandmother   . Kidney failure Father   . Hypertension Brother   . Colon cancer Neg Hx   . Colon polyps Neg Hx   . Esophageal cancer Neg Hx   . Rectal cancer Neg Hx   . Stomach cancer Neg Hx    Social History:  reports that he has never smoked. He has never used smokeless tobacco. He reports  that he does not drink alcohol or use drugs.  Review of Systems  Gastrointestinal: Positive for abdominal pain.  All other systems reviewed and are negative.   Physical Exam:  Vital signs in last 24 hours: Temp:  [98.9 F (37.2 C)] 98.9 F (37.2 C) (06/25 1055) Pulse Rate:  [79] 79 (06/25 1055) Resp:  [18] 18 (06/25 1055) BP: (142)/(80) 142/80 (06/25 1055) SpO2:  [98 %] 98 % (06/25 1055) Weight:  [108 kg (238 lb)] 108 kg (238 lb) (06/25 1009) Physical Exam  Constitutional: He is oriented to person, place, and time. He appears well-developed and well-nourished.  HENT:  Head: Normocephalic  and atraumatic.  Eyes: EOM are normal. Pupils are equal, round, and reactive to light.  Neck: Normal range of motion. No thyromegaly present.  Cardiovascular: Normal rate and regular rhythm.   Respiratory: Effort normal. No respiratory distress.  GI: Soft. He exhibits no distension.  Musculoskeletal: Normal range of motion. He exhibits no edema.  Neurological: He is alert and oriented to person, place, and time.  Skin: Skin is warm and dry.  Psychiatric: He has a normal mood and affect. His behavior is normal. Judgment and thought content normal.    Laboratory Data:  Results for orders placed or performed during the hospital encounter of 01/14/17 (from the past 24 hour(s))  I-STAT, chem 8     Status: Abnormal   Collection Time: 01/14/17 10:43 AM  Result Value Ref Range   Sodium 144 135 - 145 mmol/L   Potassium 4.1 3.5 - 5.1 mmol/L   Chloride 103 101 - 111 mmol/L   BUN 18 6 - 20 mg/dL   Creatinine, Ser 1.20 0.61 - 1.24 mg/dL   Glucose, Bld 104 (H) 65 - 99 mg/dL   Calcium, Ion 1.43 (H) 1.15 - 1.40 mmol/L   TCO2 29 0 - 100 mmol/L   Hemoglobin 13.6 13.0 - 17.0 g/dL   HCT 40.0 39.0 - 52.0 %   No results found for this or any previous visit (from the past 240 hour(s)). Creatinine:  Recent Labs  01/14/17 1043  CREATININE 1.20   Baseline Creatinine: 1.2  Impression/Assessment:  60yo with right inguinal hernia with IPP reservoir  Plan:  The risks/benefits/alternatives to right inguinal hernia repair was explained the the patient and he understands and wishes to proceed with surgery  Nicolette Bang 01/14/2017, 2:05 PM

## 2017-01-14 NOTE — Anesthesia Preprocedure Evaluation (Signed)
Anesthesia Evaluation  Patient identified by MRN, date of birth, ID band Patient awake    Reviewed: Allergy & Precautions, NPO status , Patient's Chart, lab work & pertinent test results  Airway Mallampati: I  TM Distance: >3 FB Neck ROM: Full    Dental no notable dental hx. (+) Dental Advisory Given   Pulmonary neg pulmonary ROS,    Pulmonary exam normal breath sounds clear to auscultation       Cardiovascular hypertension, Pt. on medications Normal cardiovascular exam Rhythm:Regular Rate:Normal  10-14-13 2D Echo Impressions:  Normal LV size and systolic function, EF 69-48%. Mild LV hypertrophy. Normal RV size and systolic function. No significant valvular abnormalities.   Neuro/Psych negative neurological ROS     GI/Hepatic negative GI ROS, Neg liver ROS,   Endo/Other  diabetes, Well Controlled, Type 2, Oral Hypoglycemic Agents  Renal/GU negative Renal ROS  negative genitourinary   Musculoskeletal negative musculoskeletal ROS (+)   Abdominal (+) + obese,   Peds  Hematology negative hematology ROS (+)   Anesthesia Other Findings   Reproductive/Obstetrics negative OB ROS                             Anesthesia Physical  Anesthesia Plan  ASA: II  Anesthesia Plan: General   Post-op Pain Management:    Induction: Intravenous  PONV Risk Score and Plan:   Airway Management Planned: LMA and Oral ETT  Additional Equipment:   Intra-op Plan:   Post-operative Plan: Extubation in OR  Informed Consent: I have reviewed the patients History and Physical, chart, labs and discussed the procedure including the risks, benefits and alternatives for the proposed anesthesia with the patient or authorized representative who has indicated his/her understanding and acceptance.     Plan Discussed with: CRNA and Surgeon  Anesthesia Plan Comments:         Anesthesia Quick  Evaluation

## 2017-01-14 NOTE — Transfer of Care (Signed)
Immediate Anesthesia Transfer of Care Note  Patient: Derrick Walker  Procedure(s) Performed: Procedure(s): HERNIA REPAIR INGUINAL ADULT (Right) REVISION PENILE PROSTHESIS (Right)  Patient Location: PACU  Anesthesia Type:General  Level of Consciousness: awake, alert  and oriented  Airway & Oxygen Therapy: Patient Spontanous Breathing and Patient connected to nasal cannula oxygen  Post-op Assessment: Report given to RN  Post vital signs: Reviewed and stable  Last Vitals: 154/95, 82, 10, 97%, 98.4 Vitals:   01/14/17 1055  BP: (!) 142/80  Pulse: 79  Resp: 18  Temp: 37.2 C    Last Pain:  Vitals:   01/14/17 1055  TempSrc: Oral  PainSc: 8       Patients Stated Pain Goal: 8 (81/10/31 5945)  Complications: No apparent anesthesia complications

## 2017-01-14 NOTE — Brief Op Note (Signed)
01/14/2017  4:32 PM  PATIENT:  Derrick Walker  61 y.o. male  PRE-OPERATIVE DIAGNOSIS:  RIGHT INGUINAL HERNIA, RESERVOIR HERINA  POST-OPERATIVE DIAGNOSIS:  RIGHT INGUINAL HERNIA, RESERVOIR HERINA  PROCEDURE:  Procedure(s): HERNIA REPAIR INGUINAL ADULT (Right) REVISION PENILE PROSTHESIS (Right)  SURGEON:  Surgeon(s) and Role:    * McKenzie, Candee Furbish, MD - Primary  PHYSICIAN ASSISTANT:   ASSISTANTS: none   ANESTHESIA:   general  EBL:  Total I/O In: 1000 [I.V.:1000] Out: 210 [Urine:200; Blood:10]  BLOOD ADMINISTERED:none  DRAINS: Urinary Catheter (Foley)   LOCAL MEDICATIONS USED:  OTHER exparel  SPECIMEN:  No Specimen  DISPOSITION OF SPECIMEN:  N/A  COUNTS:  YES  TOURNIQUET:  * No tourniquets in log *  DICTATION: .Note written in EPIC  PLAN OF CARE: Admit for overnight observation  PATIENT DISPOSITION:  PACU - hemodynamically stable.   Delay start of Pharmacological VTE agent (>24hrs) due to surgical blood loss or risk of bleeding: not applicable

## 2017-01-14 NOTE — Anesthesia Procedure Notes (Addendum)
Procedure Name: Intubation Date/Time: 01/14/2017 2:18 PM Performed by: Bethena Roys T Pre-anesthesia Checklist: Patient identified, Emergency Drugs available, Suction available and Patient being monitored Patient Re-evaluated:Patient Re-evaluated prior to inductionOxygen Delivery Method: Circle system utilized Preoxygenation: Pre-oxygenation with 100% oxygen Intubation Type: IV induction Ventilation: Mask ventilation without difficulty Laryngoscope Size: Mac and 4 Grade View: Grade II Tube type: Oral Tube size: 8.0 mm Number of attempts: 1 Airway Equipment and Method: Stylet and Oral airway Placement Confirmation: ETT inserted through vocal cords under direct vision,  positive ETCO2 and breath sounds checked- equal and bilateral Secured at: 23 cm Tube secured with: Tape Dental Injury: Teeth and Oropharynx as per pre-operative assessment and Injury to lip  Comments: L upper left pinched with intubation, small laceration on upper lip from tooth

## 2017-01-15 ENCOUNTER — Encounter (HOSPITAL_BASED_OUTPATIENT_CLINIC_OR_DEPARTMENT_OTHER): Payer: Self-pay | Admitting: Urology

## 2017-01-15 DIAGNOSIS — K4090 Unilateral inguinal hernia, without obstruction or gangrene, not specified as recurrent: Secondary | ICD-10-CM | POA: Diagnosis not present

## 2017-01-15 LAB — CBC
HCT: 37.7 % — ABNORMAL LOW (ref 39.0–52.0)
Hemoglobin: 12.8 g/dL — ABNORMAL LOW (ref 13.0–17.0)
MCH: 29.7 pg (ref 26.0–34.0)
MCHC: 34 g/dL (ref 30.0–36.0)
MCV: 87.5 fL (ref 78.0–100.0)
PLATELETS: 254 10*3/uL (ref 150–400)
RBC: 4.31 MIL/uL (ref 4.22–5.81)
RDW: 13.5 % (ref 11.5–15.5)
WBC: 9.3 10*3/uL (ref 4.0–10.5)

## 2017-01-15 LAB — BASIC METABOLIC PANEL
Anion gap: 7 (ref 5–15)
BUN: 14 mg/dL (ref 6–20)
CALCIUM: 10 mg/dL (ref 8.9–10.3)
CO2: 27 mmol/L (ref 22–32)
Chloride: 107 mmol/L (ref 101–111)
Creatinine, Ser: 1.02 mg/dL (ref 0.61–1.24)
GFR calc Af Amer: >60 mL/min (ref >60)
GLUCOSE: 139 mg/dL — AB (ref 65–99)
POTASSIUM: 3.5 mmol/L (ref 3.5–5.1)
SODIUM: 141 mmol/L (ref 135–145)

## 2017-01-15 LAB — GLUCOSE, CAPILLARY
GLUCOSE-CAPILLARY: 143 mg/dL — AB (ref 65–99)
Glucose-Capillary: 145 mg/dL — ABNORMAL HIGH (ref 65–99)

## 2017-01-15 MED ORDER — OXYCODONE-ACETAMINOPHEN 5-325 MG PO TABS: 1.0000 | ORAL_TABLET | ORAL | 0 refills | Status: DC | PRN

## 2017-01-15 NOTE — Discharge Instructions (Signed)
Open Hernia Repair, Adult, Care After °These instructions give you information about caring for yourself after your procedure. Your doctor may also give you more specific instructions. If you have problems or questions, contact your doctor. °Follow these instructions at home: °Surgical cut (incision) care  ° °· Follow instructions from your doctor about how to take care of your surgical cut area. Make sure you: °¨ Wash your hands with soap and water before you change your bandage (dressing). If you cannot use soap and water, use hand sanitizer. °¨ Change your bandage as told by your doctor. °¨ Leave stitches (sutures), skin glue, or skin tape (adhesive) strips in place. They may need to stay in place for 2 weeks or longer. If tape strips get loose and curl up, you may trim the loose edges. Do not remove tape strips completely unless your doctor says it is okay. °· Check your surgical cut every day for signs of infection. Check for: °¨ More redness, swelling, or pain. °¨ More fluid or blood. °¨ Warmth. °¨ Pus or a bad smell. °Activity  °· Do not drive or use heavy machinery while taking prescription pain medicine. Do not drive until your doctor says it is okay. °· Until your doctor says it is okay: °¨ Do not lift anything that is heavier than 10 lb (4.5 kg). °¨ Do not play contact sports. °· Return to your normal activities as told by your doctor. Ask your doctor what activities are safe. °General instructions  °· To prevent or treat having a hard time pooping (constipation) while you are taking prescription pain medicine, your doctor may recommend that you: °¨ Drink enough fluid to keep your pee (urine) clear or pale yellow. °¨ Take over-the-counter or prescription medicines. °¨ Eat foods that are high in fiber, such as fresh fruits and vegetables, whole grains, and beans. °¨ Limit foods that are high in fat and processed sugars, such as fried and sweet foods. °· Take over-the-counter and prescription medicines only  as told by your doctor. °· Do not take baths, swim, or use a hot tub until your doctor says it is okay. °· Keep all follow-up visits as told by your doctor. This is important. °Contact a doctor if: °· You develop a rash. °· You have more redness, swelling, or pain around your surgical cut. °· You have more fluid or blood coming from your surgical cut. °· Your surgical cut feels warm to the touch. °· You have pus or a bad smell coming from your surgical cut. °· You have a fever or chills. °· You have blood in your poop (stool). °· You have not pooped in 2-3 days. °· Medicine does not help your pain. °Get help right away if: °· You have chest pain or you are short of breath. °· You feel light-headed. °· You feel weak and dizzy (feel faint). °· You have very bad pain. °· You throw up (vomit) and your pain is worse. °This information is not intended to replace advice given to you by your health care provider. Make sure you discuss any questions you have with your health care provider. °Document Released: 07/30/2014 Document Revised: 01/27/2016 Document Reviewed: 12/21/2015 °Elsevier Interactive Patient Education © 2017 [employer] ° °

## 2017-01-15 NOTE — Care Management Note (Signed)
Case Management Note  Patient Details  Name: Derrick Walker MRN: [id number] Date of Birth: 01-19-1956  Subjective/Objective: 61 y/o m admitted w/inguinal hernia. From home.                   Action/Plan:d/c home.   Expected Discharge Date:  01/15/17               Expected Discharge Plan:  Home/Self Care  In-House Referral:     Discharge planning Services  CM Consult  Post Acute Care Choice:    Choice offered to:     DME Arranged:    DME Agency:     HH Arranged:    HH Agency:     Status of Service:  Completed, signed off  If discussed at [employer] of Stay Meetings, dates discussed:    Additional Comments:  Dessa Phi, RN 01/15/2017, 11:29 AM

## 2017-01-18 NOTE — Discharge Summary (Signed)
Physician Discharge Summary  Patient ID: Derrick Walker MRN: [id number] DOB/AGE: [birth year] 60 y.o.  Admit date: 01/14/2017 Discharge date: 01/15/2017  Admission Diagnoses: inguianl hernia Discharge Diagnoses:  Active Problems:   Inguinal hernia   Discharged Condition: good  Hospital Course: The patient tolerated the procedure well and was transferred to the floor on IV pain meds, IV fluid. On POD#1 foley was removed, pt was started on regular diet and they ambulated in the halls. Prior to discharge the pt was tolerating a regular diet, pain was controlled on PO pain meds, they were ambulating without difficulty, and they had normal bowel function.   Consults: None  Significant Diagnostic Studies: none  Treatments: surgery: right inguinal hernia repair and IPP revision  Discharge Exam: Blood pressure 132/70, pulse 87, temperature 97.9 F (36.6 C), temperature source Oral, resp. rate 18, height 5\' 11"  (1.803 m), weight 112 kg (246 lb 14.6 oz), SpO2 99 %. General appearance: alert, cooperative and appears stated age Eyes: conjunctivae/corneas clear. PERRL, EOM's intact. Fundi benign. Nose: Nares normal. Septum midline. Mucosa normal. No drainage or sinus tenderness. Resp: clear to auscultation bilaterally Cardio: regular rate and rhythm, S1, S2 normal, no murmur, click, rub or gallop GI: soft, non-tender; bowel sounds normal; no masses,  no organomegaly Male genitalia: normal Neurologic: Grossly normal Incision/Wound: clean dry intact  Disposition: 01-Home or Self Care  Discharge Instructions    Discharge patient    Complete by:  As directed    Discharge disposition:  01-Home or Self Care   Discharge patient date:  01/15/2017     Allergies as of 01/15/2017      Reactions   Shellfish Allergy Anaphylaxis   Losartan Other (See Comments)   Kidney pain, leg swelling   Oxycodone Hives   percocet      Medication List    TAKE these medications   acetaminophen 500 MG  tablet Commonly known as:  TYLENOL Take 1,000 mg by mouth every 6 (six) hours as needed for moderate pain.   canagliflozin 100 MG Tabs tablet Commonly known as:  INVOKANA Take 1 tablet (100 mg total) by mouth daily before breakfast.   EPINEPHrine 0.3 mg/0.3 mL Soaj injection Commonly known as:  EPI-PEN Inject 0.3 mg into the muscle as needed.   hydrochlorothiazide 25 MG tablet Commonly known as:  HYDRODIURIL Take 1 tablet (25 mg total) by mouth daily.   ibuprofen 200 MG tablet Commonly known as:  ADVIL,MOTRIN Take 200 mg by mouth every 6 (six) hours as needed for moderate pain.   metFORMIN 1000 MG tablet Commonly known as:  GLUCOPHAGE Take 1 tablet (1,000 mg total) by mouth 2 (two) times daily with a meal.   oxyCODONE-acetaminophen 5-325 MG tablet Commonly known as:  ROXICET Take 1 tablet by mouth every 4 (four) hours as needed.   traMADol 50 MG tablet Commonly known as:  ULTRAM Take 50 mg by mouth every 6 (six) hours as needed for moderate pain or severe pain.      Follow-up Information    Sherod Cisse, Candee Furbish, MD. Call on 01/25/2017.   Specialty:  Urology Contact information: [address] [place] [address] [phone]           Signed: Nicolette Bang 01/18/2017, 12:40 PM

## 2017-01-21 NOTE — Op Note (Signed)
Preoperative diagnosis: right inguinal hernia  Postoperative diagnosis: Same  Procedure: 1. right inguinal hernia repair 2. IPP revision  Attending: Nicolette Bang, MD  Anesthesia: General  History of blood loss: Minimal  Antibiotics: Vancomycin and Gentamicin  Drains: none  Specimens: none  Findings: 3cm defect int he abdominal wall. Kinked reservoir tubing requiring revision.   Indications: Patient is a 61 year old male with a history of right inguinal hernia with herniation of the IPP reservoir.  We discussed the treatment options and after discussing treatment options he decided to proceed with right inguinal hernia repair.   Procedure in detail: Prior to procedure consent was obtained.  Patient was brought to the operating room and a brief timeout was done to ensure correct patient, correct procedure, correct site.  General anesthesia was administered and patient was placed in supine position.  His genitalia and abdomen was then prepped and draped in usual sterile fashion.  A 5 cm incision was made in the inguinal fold.  We dissected down through the subcutaneous tissue to until we reached the spermatic cord.  We then identified the internal ring and noted a 3cm defect in the internal ring. We then identified the reservoir and noted a kink in the tubing which did not allow the IPP to cycle properly. We then sharply incised the ends of the tubing. We then placed the barrel connectors over the tubing the crimped the connectors.  We then cycled the device and it functioned properly. We then closed the defect in the internal ring with 0 prolene in an interrupted fashion. We then closed the subcutaneous tissues in 2 layers with 2-0 Vicryl in a running fashion.  We then loosely closed the skin with 4-0 Monocryl in a running fashion.  A dressing was then applied to the incision and to the Penrose drain.  We then placed a scrotal fluff and this then concluded the procedure which was well  tolerated by the patient.  Complications: None  Condition: Stable, extubated, transferred to PACU.  Plan: Patient is to be admitted for 24 hours of IV antibiotics.  He is to follow up in 2 weeks for wound check and drain removal.

## 2017-01-28 ENCOUNTER — Other Ambulatory Visit: Payer: Self-pay | Admitting: Urology

## 2017-01-28 DIAGNOSIS — N5231 Erectile dysfunction following radical prostatectomy: Secondary | ICD-10-CM | POA: Diagnosis not present

## 2017-02-07 ENCOUNTER — Encounter (HOSPITAL_BASED_OUTPATIENT_CLINIC_OR_DEPARTMENT_OTHER): Payer: Self-pay | Admitting: *Deleted

## 2017-02-07 NOTE — Progress Notes (Signed)
To [facility] at 0630-Istat 8 on arrival-Ekg with chart Npo after Mn-scheduled as 23 hour observation-Hibiclens shower in [place] arrival for completion of antibiotics infusion in  pre op.

## 2017-02-21 ENCOUNTER — Encounter (HOSPITAL_BASED_OUTPATIENT_CLINIC_OR_DEPARTMENT_OTHER): Payer: Self-pay

## 2017-02-21 ENCOUNTER — Observation Stay (HOSPITAL_BASED_OUTPATIENT_CLINIC_OR_DEPARTMENT_OTHER)
Admission: RE | Admit: 2017-02-21 | Discharge: 2017-02-22 | Disposition: A | Discharge status: Home / Self Care | Payer: Medicare HMO | Source: Ambulatory Visit | Attending: Urology | Admitting: Urology

## 2017-02-21 ENCOUNTER — Ambulatory Visit (HOSPITAL_BASED_OUTPATIENT_CLINIC_OR_DEPARTMENT_OTHER): Payer: Medicare HMO | Admitting: Anesthesiology

## 2017-02-21 ENCOUNTER — Encounter (HOSPITAL_COMMUNITY)
Admission: RE | Disposition: A | Discharge status: Home / Self Care | Payer: Self-pay | Source: Ambulatory Visit | Attending: Urology

## 2017-02-21 DIAGNOSIS — Z841 Family history of disorders of kidney and ureter: Secondary | ICD-10-CM | POA: Diagnosis not present

## 2017-02-21 DIAGNOSIS — Z7984 Long term (current) use of oral hypoglycemic drugs: Secondary | ICD-10-CM | POA: Insufficient documentation

## 2017-02-21 DIAGNOSIS — Z888 Allergy status to other drugs, medicaments and biological substances status: Secondary | ICD-10-CM | POA: Diagnosis not present

## 2017-02-21 DIAGNOSIS — N529 Male erectile dysfunction, unspecified: Secondary | ICD-10-CM | POA: Insufficient documentation

## 2017-02-21 DIAGNOSIS — Z91013 Allergy to seafood: Secondary | ICD-10-CM | POA: Diagnosis not present

## 2017-02-21 DIAGNOSIS — Z885 Allergy status to narcotic agent status: Secondary | ICD-10-CM | POA: Insufficient documentation

## 2017-02-21 DIAGNOSIS — Z9079 Acquired absence of other genital organ(s): Secondary | ICD-10-CM | POA: Insufficient documentation

## 2017-02-21 DIAGNOSIS — E119 Type 2 diabetes mellitus without complications: Secondary | ICD-10-CM | POA: Insufficient documentation

## 2017-02-21 DIAGNOSIS — Z8546 Personal history of malignant neoplasm of prostate: Secondary | ICD-10-CM | POA: Diagnosis not present

## 2017-02-21 DIAGNOSIS — I10 Essential (primary) hypertension: Secondary | ICD-10-CM | POA: Insufficient documentation

## 2017-02-21 DIAGNOSIS — Z79899 Other long term (current) drug therapy: Secondary | ICD-10-CM | POA: Diagnosis not present

## 2017-02-21 DIAGNOSIS — T83490A Other mechanical complication of penile (implanted) prosthesis, initial encounter: Principal | ICD-10-CM | POA: Insufficient documentation

## 2017-02-21 DIAGNOSIS — Z8249 Family history of ischemic heart disease and other diseases of the circulatory system: Secondary | ICD-10-CM | POA: Insufficient documentation

## 2017-02-21 DIAGNOSIS — T83490S Other mechanical complication of penile (implanted) prosthesis, sequela: Secondary | ICD-10-CM | POA: Diagnosis not present

## 2017-02-21 DIAGNOSIS — K4090 Unilateral inguinal hernia, without obstruction or gangrene, not specified as recurrent: Secondary | ICD-10-CM | POA: Diagnosis not present

## 2017-02-21 DIAGNOSIS — N5201 Erectile dysfunction due to arterial insufficiency: Secondary | ICD-10-CM

## 2017-02-21 HISTORY — PX: PENILE PROSTHESIS IMPLANT: SHX240

## 2017-02-21 LAB — BASIC METABOLIC PANEL
Anion gap: 9 (ref 5–15)
BUN: 18 mg/dL (ref 6–20)
CHLORIDE: 104 mmol/L (ref 101–111)
CO2: 27 mmol/L (ref 22–32)
CREATININE: 1.41 mg/dL — AB (ref 0.61–1.24)
Calcium: 10.6 mg/dL — ABNORMAL HIGH (ref 8.9–10.3)
GFR calc Af Amer: >60 mL/min (ref >60)
GFR calc non Af Amer: 53 mL/min — ABNORMAL LOW (ref >60)
GLUCOSE: 125 mg/dL — AB (ref 65–99)
POTASSIUM: 3.5 mmol/L (ref 3.5–5.1)
Sodium: 140 mmol/L (ref 135–145)

## 2017-02-21 LAB — CBC
HEMATOCRIT: 41.8 % (ref 39.0–52.0)
HEMOGLOBIN: 14.5 g/dL (ref 13.0–17.0)
MCH: 30.9 pg (ref 26.0–34.0)
MCHC: 34.7 g/dL (ref 30.0–36.0)
MCV: 88.9 fL (ref 78.0–100.0)
Platelets: 250 10*3/uL (ref 150–400)
RBC: 4.7 MIL/uL (ref 4.22–5.81)
RDW: 13.6 % (ref 11.5–15.5)
WBC: 10.6 10*3/uL — ABNORMAL HIGH (ref 4.0–10.5)

## 2017-02-21 LAB — GLUCOSE, CAPILLARY
Glucose-Capillary: 116 mg/dL — ABNORMAL HIGH (ref 65–99)
Glucose-Capillary: 143 mg/dL — ABNORMAL HIGH (ref 65–99)
Glucose-Capillary: 123 mg/dL — ABNORMAL HIGH (ref 65–99)

## 2017-02-21 LAB — POCT I-STAT, CHEM 8
BUN: 16 mg/dL (ref 6–20)
CREATININE: 1.2 mg/dL (ref 0.61–1.24)
Calcium, Ion: 1.44 mmol/L — ABNORMAL HIGH (ref 1.15–1.40)
Chloride: 103 mmol/L (ref 101–111)
Glucose, Bld: 102 mg/dL — ABNORMAL HIGH (ref 65–99)
HCT: 41 % (ref 39.0–52.0)
HEMOGLOBIN: 13.9 g/dL (ref 13.0–17.0)
POTASSIUM: 3.3 mmol/L — AB (ref 3.5–5.1)
Sodium: 142 mmol/L (ref 135–145)
TCO2: 27 mmol/L (ref 0–100)

## 2017-02-21 SURGERY — INSERTION, PENILE PROSTHESIS
Anesthesia: General

## 2017-02-21 MED ORDER — BELLADONNA ALKALOIDS-OPIUM 16.2-60 MG RE SUPP: 1.0000 | Freq: Four times a day (QID) | RECTAL | Status: DC | PRN

## 2017-02-21 MED ORDER — FENTANYL CITRATE (PF) 100 MCG/2ML IJ SOLN
INTRAMUSCULAR | Status: DC | PRN
  Administered 2017-02-21 (×5): 50 ug via INTRAVENOUS

## 2017-02-21 MED ORDER — LIDOCAINE 2% (20 MG/ML) 5 ML SYRINGE
INTRAMUSCULAR | Status: AC
  Filled 2017-02-21: qty 5

## 2017-02-21 MED ORDER — VITAMINS A & D EX OINT
TOPICAL_OINTMENT | CUTANEOUS | Status: AC
  Administered 2017-02-21
  Filled 2017-02-21: qty 5

## 2017-02-21 MED ORDER — LIDOCAINE 2% (20 MG/ML) 5 ML SYRINGE
INTRAMUSCULAR | Status: DC | PRN
  Administered 2017-02-21: 100 mg via INTRAVENOUS

## 2017-02-21 MED ORDER — LACTATED RINGERS IV SOLN
INTRAVENOUS | Status: DC
  Administered 2017-02-21 (×2): via INTRAVENOUS
  Filled 2017-02-21: qty 1000

## 2017-02-21 MED ORDER — MIDAZOLAM HCL 2 MG/2ML IJ SOLN
INTRAMUSCULAR | Status: AC
  Filled 2017-02-21: qty 2

## 2017-02-21 MED ORDER — INSULIN ASPART 100 UNIT/ML ~~LOC~~ SOLN
0.0000 [IU] | Freq: Three times a day (TID) | SUBCUTANEOUS | Status: DC
  Administered 2017-02-21: 2 [IU] via SUBCUTANEOUS

## 2017-02-21 MED ORDER — SODIUM CHLORIDE 0.9 % IV SOLN
INTRAVENOUS | Status: DC
  Administered 2017-02-21 – 2017-02-22 (×2): via INTRAVENOUS

## 2017-02-21 MED ORDER — FENTANYL CITRATE (PF) 100 MCG/2ML IJ SOLN
INTRAMUSCULAR | Status: AC
  Filled 2017-02-21: qty 2

## 2017-02-21 MED ORDER — GENTAMICIN SULFATE 40 MG/ML IJ SOLN
5.0000 mg/kg | Freq: Once | INTRAVENOUS | Status: DC
  Filled 2017-02-21: qty 10.75

## 2017-02-21 MED ORDER — FENTANYL CITRATE (PF) 100 MCG/2ML IJ SOLN
50.0000 ug | Freq: Once | INTRAMUSCULAR | Status: AC
  Administered 2017-02-21: 50 ug via INTRAVENOUS
  Filled 2017-02-21: qty 1

## 2017-02-21 MED ORDER — DIPHENHYDRAMINE HCL 12.5 MG/5ML PO ELIX: 12.5000 mg | ORAL_SOLUTION | Freq: Four times a day (QID) | ORAL | Status: DC | PRN

## 2017-02-21 MED ORDER — PROPOFOL 10 MG/ML IV BOLUS
INTRAVENOUS | Status: DC | PRN
  Administered 2017-02-21: 300 mg via INTRAVENOUS
  Administered 2017-02-21: 40 mg via INTRAVENOUS

## 2017-02-21 MED ORDER — ONDANSETRON HCL 4 MG/2ML IJ SOLN
INTRAMUSCULAR | Status: DC | PRN
  Administered 2017-02-21: 4 mg via INTRAVENOUS

## 2017-02-21 MED ORDER — HYDROMORPHONE HCL-NACL 0.5-0.9 MG/ML-% IV SOSY
0.5000 mg | PREFILLED_SYRINGE | INTRAVENOUS | Status: DC | PRN
  Administered 2017-02-21: 0.5 mg via INTRAVENOUS
  Administered 2017-02-21: 1 mg via INTRAVENOUS
  Filled 2017-02-21 (×2): qty 2

## 2017-02-21 MED ORDER — VANCOMYCIN HCL 10 G IV SOLR
1500.0000 mg | Freq: Once | INTRAVENOUS | Status: AC
  Administered 2017-02-22: 1500 mg via INTRAVENOUS
  Filled 2017-02-21: qty 1500

## 2017-02-21 MED ORDER — DEXAMETHASONE SODIUM PHOSPHATE 10 MG/ML IJ SOLN
INTRAMUSCULAR | Status: DC | PRN
  Administered 2017-02-21: 10 mg via INTRAVENOUS

## 2017-02-21 MED ORDER — PROPOFOL 10 MG/ML IV BOLUS
INTRAVENOUS | Status: AC
  Filled 2017-02-21: qty 40

## 2017-02-21 MED ORDER — ZOLPIDEM TARTRATE 5 MG PO TABS
5.0000 mg | ORAL_TABLET | Freq: Every evening | ORAL | Status: DC | PRN
  Administered 2017-02-21: 5 mg via ORAL
  Filled 2017-02-21: qty 1

## 2017-02-21 MED ORDER — FENTANYL CITRATE (PF) 250 MCG/5ML IJ SOLN
INTRAMUSCULAR | Status: AC
  Filled 2017-02-21: qty 5

## 2017-02-21 MED ORDER — EPHEDRINE SULFATE 50 MG/ML IJ SOLN
INTRAMUSCULAR | Status: DC | PRN
  Administered 2017-02-21: 10 mg via INTRAVENOUS

## 2017-02-21 MED ORDER — GENTAMICIN SULFATE 40 MG/ML IJ SOLN
5.0000 mg/kg | INTRAVENOUS | Status: AC
  Administered 2017-02-21: 560 mg via INTRAVENOUS
  Filled 2017-02-21 (×2): qty 14

## 2017-02-21 MED ORDER — HYDROCODONE-ACETAMINOPHEN 7.5-325 MG PO TABS
1.0000 | ORAL_TABLET | Freq: Once | ORAL | Status: DC | PRN
  Filled 2017-02-21: qty 1

## 2017-02-21 MED ORDER — ONDANSETRON HCL 4 MG/2ML IJ SOLN: 4.0000 mg | INTRAMUSCULAR | Status: DC | PRN

## 2017-02-21 MED ORDER — MIDAZOLAM HCL 5 MG/5ML IJ SOLN
INTRAMUSCULAR | Status: DC | PRN
  Administered 2017-02-21: 2 mg via INTRAVENOUS

## 2017-02-21 MED ORDER — DEXAMETHASONE SODIUM PHOSPHATE 10 MG/ML IJ SOLN
INTRAMUSCULAR | Status: AC
  Filled 2017-02-21: qty 1

## 2017-02-21 MED ORDER — FENTANYL CITRATE (PF) 100 MCG/2ML IJ SOLN
25.0000 ug | INTRAMUSCULAR | Status: DC | PRN
  Administered 2017-02-21 (×3): 50 ug via INTRAVENOUS
  Filled 2017-02-21: qty 1

## 2017-02-21 MED ORDER — VANCOMYCIN HCL 10 G IV SOLR
1500.0000 mg | INTRAVENOUS | Status: AC
  Administered 2017-02-21: 1500 mg via INTRAVENOUS
  Filled 2017-02-21 (×2): qty 1500

## 2017-02-21 MED ORDER — OXYCODONE-ACETAMINOPHEN 5-325 MG PO TABS
1.0000 | ORAL_TABLET | ORAL | Status: DC | PRN
  Administered 2017-02-21 – 2017-02-22 (×4): 2 via ORAL
  Filled 2017-02-21 (×4): qty 2

## 2017-02-21 MED ORDER — DIPHENHYDRAMINE HCL 50 MG/ML IJ SOLN: 12.5000 mg | Freq: Four times a day (QID) | INTRAMUSCULAR | Status: DC | PRN

## 2017-02-21 MED ORDER — ONDANSETRON HCL 4 MG/2ML IJ SOLN
INTRAMUSCULAR | Status: AC
  Filled 2017-02-21: qty 2

## 2017-02-21 MED ORDER — HYDROCHLOROTHIAZIDE 25 MG PO TABS
25.0000 mg | ORAL_TABLET | Freq: Every day | ORAL | Status: DC
Start: 2017-02-21 — End: 2017-02-22
  Administered 2017-02-21 – 2017-02-22 (×2): 25 mg via ORAL
  Filled 2017-02-21 (×2): qty 1

## 2017-02-21 MED ORDER — ROCURONIUM BROMIDE 50 MG/5ML IV SOSY
PREFILLED_SYRINGE | INTRAVENOUS | Status: AC
  Filled 2017-02-21: qty 5

## 2017-02-21 SURGICAL SUPPLY — 52 items
BAG DECANTER FOR FLEXI CONT (MISCELLANEOUS) ×2 IMPLANT
BAG URINE DRAINAGE (UROLOGICAL SUPPLIES) ×2 IMPLANT
BLADE SURG 15 STRL LF DISP TIS (BLADE) ×1 IMPLANT
BLADE SURG 15 STRL SS (BLADE) ×1
BNDG GAUZE ELAST 4 BULKY (GAUZE/BANDAGES/DRESSINGS) ×2 IMPLANT
BRIEF STRETCH FOR OB PAD LRG (UNDERPADS AND DIAPERS) ×2 IMPLANT
BRUSH SCRUB EZ  4% CHG (MISCELLANEOUS) ×2
BRUSH SCRUB EZ 4% CHG (MISCELLANEOUS) ×2 IMPLANT
CANISTER SUCT 3000ML PPV (MISCELLANEOUS) ×2 IMPLANT
CATH FOLEY 2WAY SLVR  5CC 16FR (CATHETERS) ×1
CATH FOLEY 2WAY SLVR 5CC 16FR (CATHETERS) ×1 IMPLANT
CLEANER CAUTERY TIP 5X5 PAD (MISCELLANEOUS) ×1 IMPLANT
COVER BACK TABLE 60X90IN (DRAPES) ×2 IMPLANT
COVER MAYO STAND STRL (DRAPES) ×4 IMPLANT
DERMABOND ADVANCED (GAUZE/BANDAGES/DRESSINGS) ×1
DERMABOND ADVANCED .7 DNX12 (GAUZE/BANDAGES/DRESSINGS) ×1 IMPLANT
DISSECTOR ROUND CHERRY 3/8 STR (MISCELLANEOUS) IMPLANT
DRAPE INCISE IOBAN 66X45 STRL (DRAPES) ×2 IMPLANT
DRAPE LAPAROTOMY 100X72 PEDS (DRAPES) ×2 IMPLANT
ELECT NEEDLE BLADE 2-5/6 (NEEDLE) ×2 IMPLANT
ELECT REM PT RETURN 9FT ADLT (ELECTROSURGICAL) ×2
ELECTRODE REM PT RTRN 9FT ADLT (ELECTROSURGICAL) ×1 IMPLANT
GLOVE BIO SURGEON STRL SZ8 (GLOVE) ×2 IMPLANT
GOWN STRL REUS W/TWL XL LVL3 (GOWN DISPOSABLE) ×2 IMPLANT
KIT ACCESSORY AMS 800 ×2 IMPLANT
KIT RM TURNOVER CYSTO AR (KITS) ×2 IMPLANT
NS IRRIG 500ML POUR BTL (IV SOLUTION) ×2 IMPLANT
PACK BASIN DAY SURGERY FS (CUSTOM PROCEDURE TRAY) ×2 IMPLANT
PAD CLEANER CAUTERY TIP 5X5 (MISCELLANEOUS) ×1
PENCIL BUTTON HOLSTER BLD 10FT (ELECTRODE) ×2 IMPLANT
PLUG CATH AND CAP STER (CATHETERS) ×2 IMPLANT
RESERVOIR FLAT IZ 100ML (Miscellaneous) ×2 IMPLANT
RETRACTOR DEEP SCROTAL PENILE (Miscellaneous) ×2 IMPLANT
RETRACTOR WILSON SYSTEM (INSTRUMENTS) ×2 IMPLANT
SKW DEEP SCROTAL RETRACTION SYSTEM ×2 IMPLANT
SPONGE LAP 4X18 X RAY DECT (DISPOSABLE) IMPLANT
SUPPORT SCROTAL LG STRP (MISCELLANEOUS) IMPLANT
SUT MNCRL AB 4-0 PS2 18 (SUTURE) ×2 IMPLANT
SUT VIC AB 2-0 SH 18 (SUTURE) IMPLANT
SUT VIC AB 2-0 UR6 27 (SUTURE) ×4 IMPLANT
SUT VIC AB 3-0 PS2 18 (SUTURE) ×1
SUT VIC AB 3-0 PS2 18XBRD (SUTURE) ×1 IMPLANT
SUT VIC AB 3-0 SH 27 (SUTURE) ×2
SUT VIC AB 3-0 SH 27X BRD (SUTURE) ×2 IMPLANT
SYR 20CC LL (SYRINGE) IMPLANT
SYR 50ML LL SCALE MARK (SYRINGE) ×4 IMPLANT
SYR BULB IRRIGATION 50ML (SYRINGE) ×2 IMPLANT
SYRINGE 10CC LL (SYRINGE) ×4 IMPLANT
TRAY DSU PREP LF (CUSTOM PROCEDURE TRAY) ×2 IMPLANT
TUBE CONNECTING 12X1/4 (SUCTIONS) ×2 IMPLANT
WATER STERILE IRR 500ML POUR (IV SOLUTION) ×2 IMPLANT
YANKAUER SUCT BULB TIP NO VENT (SUCTIONS) ×2 IMPLANT

## 2017-02-21 NOTE — Brief Op Note (Signed)
02/21/2017  11:08 AM  PATIENT:  Derrick Walker  61 y.o. male  PRE-OPERATIVE DIAGNOSIS:  MALFUNCTIONING INFLATABLE PENILE PROSTHESIS  POST-OPERATIVE DIAGNOSIS:  MALFUNCTIONING INFLATABLE PENILE PROSTHESIS  PROCEDURE:  Procedure(s): PENILE PROSTHESIS REVISION (N/A)  SURGEON:  Surgeon(s) and Role:    * Neal Oshea, Candee Furbish, MD - Primary  PHYSICIAN ASSISTANT:   ASSISTANTS: none   ANESTHESIA:   general  EBL:  Total I/O In: 200 [I.V.:200] Out: 25 [Blood:25]  BLOOD ADMINISTERED:none  DRAINS: Urinary Catheter (Foley)   LOCAL MEDICATIONS USED:  NONE  SPECIMEN:  No Specimen  DISPOSITION OF SPECIMEN:  N/A  COUNTS:  YES  TOURNIQUET:  * No tourniquets in log *  DICTATION: .Note written in EPIC  PLAN OF CARE: Admit for overnight observation  PATIENT DISPOSITION:  PACU - hemodynamically stable.   Delay start of Pharmacological VTE agent (>24hrs) due to surgical blood loss or risk of bleeding: not applicable

## 2017-02-21 NOTE — Anesthesia Preprocedure Evaluation (Signed)
Anesthesia Evaluation  Patient identified by MRN, date of birth, ID band Patient awake    Reviewed: Allergy & Precautions, NPO status , Patient's Chart, lab work & pertinent test results  History of Anesthesia Complications Negative for: history of anesthetic complications  Airway Mallampati: I  TM Distance: >3 FB Neck ROM: Full    Dental no notable dental hx. (+) Dental Advisory Given, Caps,    Pulmonary neg pulmonary ROS,    breath sounds clear to auscultation       Cardiovascular hypertension, Pt. on medications (-) angina(-) Past MI and (-) CHF  Rhythm:Regular Rate:Normal  10-14-13 2D Echo Impressions:  Normal LV size and systolic function, EF 35-46%. Mild LV hypertrophy. Normal RV size and systolic function. No significant valvular abnormalities.   Neuro/Psych negative neurological ROS  negative psych ROS   GI/Hepatic negative GI ROS, Neg liver ROS,   Endo/Other  diabetes, Well Controlled, Type 2, Oral Hypoglycemic AgentsMorbid obesity  Renal/GU negative Renal ROS     Musculoskeletal negative musculoskeletal ROS (+)   Abdominal (+) + obese,   Peds  Hematology negative hematology ROS (+)   Anesthesia Other Findings   Reproductive/Obstetrics                             Anesthesia Physical Anesthesia Plan  ASA: II  Anesthesia Plan: General   Post-op Pain Management:    Induction: Intravenous  PONV Risk Score and Plan: 2 and Ondansetron and Dexamethasone  Airway Management Planned: LMA  Additional Equipment: None  Intra-op Plan:   Post-operative Plan: Extubation in OR  Informed Consent: I have reviewed the patients History and Physical, chart, labs and discussed the procedure including the risks, benefits and alternatives for the proposed anesthesia with the patient or authorized representative who has indicated his/her understanding and acceptance.   Dental advisory  given  Plan Discussed with: Surgeon and CRNA  Anesthesia Plan Comments:         Anesthesia Quick Evaluation

## 2017-02-21 NOTE — Anesthesia Postprocedure Evaluation (Signed)
Anesthesia Post Note  Patient: Derrick Walker  Procedure(s) Performed: Procedure(s) (LRB): PENILE PROSTHESIS REVISION (N/A)     Patient location during evaluation: PACU Anesthesia Type: General Level of consciousness: awake and alert Pain management: pain level controlled Vital Signs Assessment: post-procedure vital signs reviewed and stable Respiratory status: spontaneous breathing, nonlabored ventilation, respiratory function stable and patient connected to nasal cannula oxygen Cardiovascular status: blood pressure returned to baseline and stable Postop Assessment: no signs of nausea or vomiting Anesthetic complications: no    Last Vitals:  Vitals:   02/21/17 1437 02/21/17 1739  BP: (!) 149/70 139/77  Pulse: (!) 103 88  Resp: 15 15  Temp: 36.9 C (!) 36.4 C    Last Pain:  Vitals:   02/21/17 1945  TempSrc:   PainSc: 9                  Shaniqwa Horsman

## 2017-02-21 NOTE — Anesthesia Procedure Notes (Signed)
Procedure Name: LMA Insertion Date/Time: 02/21/2017 8:59 AM Performed by: Bethena Roys T Pre-anesthesia Checklist: Patient identified, Emergency Drugs available, Suction available and Patient being monitored Patient Re-evaluated:Patient Re-evaluated prior to induction Oxygen Delivery Method: Circle system utilized Preoxygenation: Pre-oxygenation with 100% oxygen Induction Type: IV induction Ventilation: Mask ventilation without difficulty LMA: LMA inserted LMA Size: 5.0 Number of attempts: 1 Airway Equipment and Method: Bite block Placement Confirmation: positive ETCO2 Tube secured with: Tape Dental Injury: Teeth and Oropharynx as per pre-operative assessment

## 2017-02-21 NOTE — Progress Notes (Signed)
Pharmacy Antibiotic Note  Derrick Walker is a 62 y.o. male admitted on 02/21/2017 with post-op surgical prophylaxis.  Pharmacy has been consulted for vancomycin and gentamicin dosing. Clarified that these are to be one time post-op doses  Plan: Vancomycin 1500 mg IV x1 Gentamicin 430 mg IV x1 using adjust BW  Height: 5\' 11"  (180.3 cm) Weight: 230 lb (104.3 kg) IBW/kg (Calculated) : 75.3  Temp (24hrs), Avg:98.3 F (36.8 C), Min:98 F (36.7 C), Max:98.6 F (37 C)   Recent Labs Lab 02/21/17 0646 02/21/17 1223  WBC  --  10.6*  CREATININE 1.20 1.41*    Estimated Creatinine Clearance: 69.3 mL/min (A) (by C-G formula based on SCr of 1.41 mg/dL (H)).    Allergies  Allergen Reactions  . Shellfish Allergy Anaphylaxis  . Oxycodone Hives    Able to take percocet without problems  . Losartan Other (See Comments)    Kidney pain, leg swelling    Antimicrobials this admission:   Dose adjustments this admission:   Microbiology results:  Thank you for allowing pharmacy to be a part of this patient's care.   Royetta Asal, PharmD, BCPS Pager [phone] 02/21/2017 2:36 PM

## 2017-02-21 NOTE — Transfer of Care (Signed)
Immediate Anesthesia Transfer of Care Note  Patient: Derrick Walker  Procedure(s) Performed: Procedure(s): PENILE PROSTHESIS REVISION (N/A)  Patient Location: PACU  Anesthesia Type:General  Level of Consciousness: drowsy  Airway & Oxygen Therapy: Patient Spontanous Breathing and Patient connected to nasal cannula oxygen  Post-op Assessment: Report given to RN  Post vital signs: stable  Last Vitals: 160/88, 106, 18, 100%, 98.6 Vitals:   02/21/17 0615  BP: (!) 144/86  Pulse: 77  Resp: 18  Temp: 36.8 C    Last Pain:  Vitals:   02/21/17 0615  TempSrc: Oral      Patients Stated Pain Goal: 8 (15/04/13 6438)  Complications: No apparent anesthesia complications

## 2017-02-21 NOTE — H&P (Signed)
Urology Admission H&P  Chief Complaint: malfunctioning IPP  History of Present Illness: Mr Mcgrady is a 61yo with a hx of ED and IPP placement. His IPP stopped cycling and he elects to proceed with IPP revision  Past Medical History:  Diagnosis Date  . Erectile dysfunction   . History of prostate cancer urologist-  dr Orpah Melter   dx 05/ 2015 -- moderate risk Stage T2c, Gleason 3+4,  vol 152.41cc---  09/ 2015 s/p  radical prostatectomy -- per pt last PSA 07-15-2016  undectable  . Hypertension, essential   . Right inguinal hernia    from penile prosthesis reservior post surgery 11-19-2016  . Right inguinal pain   . Type 2 diabetes mellitus ([place])    Past Surgical History:  Procedure Laterality Date  . INGUINAL HERNIA REPAIR Right 01/14/2017   Procedure: HERNIA REPAIR INGUINAL ADULT;  Surgeon: Cleon Gustin, MD;  Location: [facility];  Service: Urology;  Laterality: Right;  . LYMPHADENECTOMY Bilateral 04/09/2014   Procedure: BILATERAL PELVIC LYMPH NODE DISSECTION;  Surgeon: Alexis Frock, MD;  Location: [facility];  Service: Urology;  Laterality: Bilateral;  . PENILE PROSTHESIS IMPLANT N/A 11/19/2016   Procedure: AMS PENILE PROTHESIS INFLATABLE;  Surgeon: Cleon Gustin, MD;  Location: [facility];  Service: Urology;  Laterality: N/A;  . PENILE PROSTHESIS IMPLANT Right 01/14/2017   Procedure: REVISION PENILE PROSTHESIS;  Surgeon: Cleon Gustin, MD;  Location: [facility];  Service: Urology;  Laterality: Right;  . PROSTATE BIOPSY N/A 12/11/2013   Procedure: BIOPSY TRANSRECTAL ULTRASONIC PROSTATE (TUBP);  Surgeon: Arvil Persons, MD;  Location: [facility];  Service: Urology;  Laterality: N/A;  . ROBOT ASSISTED LAPAROSCOPIC RADICAL PROSTATECTOMY N/A 04/09/2014   Procedure: ROBOTIC ASSISTED LAPAROSCOPIC RADICAL PROSTATECTOMY, INDOCYANINE GREEN DYE ;  Surgeon: Alexis Frock, MD;  Location: [facility];  Service: Urology;   Laterality: N/A;  . TRANSTHORACIC ECHOCARDIOGRAM  10-14-2013   dr Shanon Brow harding   mild LVH/  ef  24-23%/  grade I diastolic dysfunction/  mild LAE    Home Medications:  Prescriptions Prior to Admission  Medication Sig Dispense Refill Last Dose  . acetaminophen (TYLENOL) 500 MG tablet Take 1,000 mg by mouth every 6 (six) hours as needed for moderate pain.    Past Month at Unknown time  . canagliflozin (INVOKANA) 100 MG TABS tablet Take 1 tablet (100 mg total) by mouth daily before breakfast. 30 tablet 5 02/20/2017 at Unknown time  . hydrochlorothiazide (HYDRODIURIL) 25 MG tablet Take 1 tablet (25 mg total) by mouth daily. 90 tablet 3 02/20/2017 at Unknown time  . ibuprofen (ADVIL,MOTRIN) 200 MG tablet Take 200 mg by mouth every 6 (six) hours as needed for moderate pain.    Past Month at Unknown time  . metFORMIN (GLUCOPHAGE) 1000 MG tablet Take 1 tablet (1,000 mg total) by mouth 2 (two) times daily with a meal. 180 tablet 3 02/20/2017 at Unknown time  . EPINEPHrine 0.3 mg/0.3 mL IJ SOAJ injection Inject 0.3 mg into the muscle as needed.    Unknown at Unknown time  . oxyCODONE-acetaminophen (ROXICET) 5-325 MG tablet Take 1 tablet by mouth every 4 (four) hours as needed. 30 tablet 0 More than a month at Unknown time  . traMADol (ULTRAM) 50 MG tablet Take 50 mg by mouth every 6 (six) hours as needed for moderate pain or severe pain.    More than a month at Unknown time   Allergies:  Allergies  Allergen  Reactions  . Shellfish Allergy Anaphylaxis  . Oxycodone Hives    percocet  . Losartan Other (See Comments)    Kidney pain, leg swelling    Family History  Problem Relation Age of Onset  . Hypertension Mother   . Hypertension Maternal Grandmother   . Hypertension Paternal Grandmother   . Kidney failure Father   . Hypertension Brother   . Colon cancer Neg Hx   . Colon polyps Neg Hx   . Esophageal cancer Neg Hx   . Rectal cancer Neg Hx   . Stomach cancer Neg Hx    Social History:  reports  that he has never smoked. He has never used smokeless tobacco. He reports that he does not drink alcohol or use drugs.  Review of Systems  All other systems reviewed and are negative.   Physical Exam:  Vital signs in last 24 hours: Temp:  [98.2 F (36.8 C)] 98.2 F (36.8 C) (08/02 0615) Pulse Rate:  [77] 77 (08/02 0615) Resp:  [18] 18 (08/02 0615) BP: (144)/(86) 144/86 (08/02 0615) SpO2:  [99 %] 99 % (08/02 0615) Weight:  [104.3 kg (230 lb)] 104.3 kg (230 lb) (08/02 0615) Physical Exam  Constitutional: He is oriented to person, place, and time. He appears well-developed and well-nourished.  HENT:  Head: Normocephalic and atraumatic.  Eyes: Pupils are equal, round, and reactive to light. EOM are normal.  Neck: Normal range of motion. No thyromegaly present.  Cardiovascular: Normal rate and regular rhythm.   Respiratory: Effort normal. No respiratory distress.  GI: Soft. He exhibits no distension.  Musculoskeletal: Normal range of motion. He exhibits no edema.  Neurological: He is alert and oriented to person, place, and time.  Skin: Skin is warm and dry.  Psychiatric: He has a normal mood and affect. His behavior is normal. Judgment and thought content normal.    Laboratory Data:  Results for orders placed or performed during the hospital encounter of 02/21/17 (from the past 24 hour(s))  I-STAT, chem 8     Status: Abnormal   Collection Time: 02/21/17  6:46 AM  Result Value Ref Range   Sodium 142 135 - 145 mmol/L   Potassium 3.3 (L) 3.5 - 5.1 mmol/L   Chloride 103 101 - 111 mmol/L   BUN 16 6 - 20 mg/dL   Creatinine, Ser 1.20 0.61 - 1.24 mg/dL   Glucose, Bld 102 (H) 65 - 99 mg/dL   Calcium, Ion 1.44 (H) 1.15 - 1.40 mmol/L   TCO2 27 0 - 100 mmol/L   Hemoglobin 13.9 13.0 - 17.0 g/dL   HCT 41.0 39.0 - 52.0 %   No results found for this or any previous visit (from the past 240 hour(s)). Creatinine:  Recent Labs  02/21/17 0646  CREATININE 1.20   Baseline Creatinine:  1.2  Impression/Assessment:  61yo with malfunctioning IPP  Plan:  The risks/benefits/alternatives to revision of IPP was explained to the patient and he understands and wishes to proceed with surgery  Nicolette Bang 02/21/2017, 8:35 AM

## 2017-02-22 ENCOUNTER — Encounter (HOSPITAL_BASED_OUTPATIENT_CLINIC_OR_DEPARTMENT_OTHER): Payer: Self-pay | Admitting: Urology

## 2017-02-22 DIAGNOSIS — T83490A Other mechanical complication of penile (implanted) prosthesis, initial encounter: Secondary | ICD-10-CM | POA: Diagnosis not present

## 2017-02-22 LAB — BASIC METABOLIC PANEL
Anion gap: 8 (ref 5–15)
BUN: 17 mg/dL (ref 6–20)
CO2: 29 mmol/L (ref 22–32)
CREATININE: 1.13 mg/dL (ref 0.61–1.24)
Calcium: 10.6 mg/dL — ABNORMAL HIGH (ref 8.9–10.3)
Chloride: 103 mmol/L (ref 101–111)
Glucose, Bld: 118 mg/dL — ABNORMAL HIGH (ref 65–99)
POTASSIUM: 3.8 mmol/L (ref 3.5–5.1)
SODIUM: 140 mmol/L (ref 135–145)

## 2017-02-22 LAB — CBC
HCT: 37.6 % — ABNORMAL LOW (ref 39.0–52.0)
HEMOGLOBIN: 12.7 g/dL — AB (ref 13.0–17.0)
MCH: 30 pg (ref 26.0–34.0)
MCHC: 33.8 g/dL (ref 30.0–36.0)
MCV: 88.7 fL (ref 78.0–100.0)
Platelets: 252 10*3/uL (ref 150–400)
RBC: 4.24 MIL/uL (ref 4.22–5.81)
RDW: 13.6 % (ref 11.5–15.5)
WBC: 9.2 10*3/uL (ref 4.0–10.5)

## 2017-02-22 LAB — GLUCOSE, CAPILLARY: Glucose-Capillary: 110 mg/dL — ABNORMAL HIGH (ref 65–99)

## 2017-02-22 MED ORDER — OXYCODONE-ACETAMINOPHEN 5-325 MG PO TABS: 1.0000 | ORAL_TABLET | ORAL | 0 refills | Status: DC | PRN

## 2017-02-22 MED ORDER — SULFAMETHOXAZOLE-TRIMETHOPRIM 800-160 MG PO TABS: 1.0000 | ORAL_TABLET | Freq: Two times a day (BID) | ORAL | 0 refills | Status: DC

## 2017-02-22 NOTE — Care Management Note (Signed)
Case Management Note  Patient Details  Name: Derrick Walker MRN: [id number] Date of Birth: 1956-04-28  Subjective/Objective: 61 y/o m admitted w/erectile dysfunction. From home.                   Action/Plan:d/c home.   Expected Discharge Date:  02/22/17               Expected Discharge Plan:  Home/Self Care  In-House Referral:     Discharge planning Services  CM Consult  Post Acute Care Choice:    Choice offered to:     DME Arranged:    DME Agency:     HH Arranged:    HH Agency:     Status of Service:  Completed, signed off  If discussed at [employer] of Stay Meetings, dates discussed:    Additional Comments:  Dessa Phi, RN 02/22/2017, 8:53 AM

## 2017-02-22 NOTE — Discharge Instructions (Signed)
Penile Prosthesis Implantation °Penile prosthesis implantation is a procedure to put a device that treats erectile dysfunction into the penis. There are two main types of devices that can be put in during the procedure: malleable penile implants and inflatable penile implants. °Malleable penile implant °A malleable penile implant, also called a non-hydraulic or semi-rigid implant, consists of two silicone rubber rods. The rods provide some rigidity. They are also flexible, so the penis can both curve downward in its normal position and become straight for sexual intercourse. °Inflatable penile implant ° °An inflatable penile implant, also called a hydraulic implant, consists of cylinders, a pump, and a reservoir. The cylinders can be inflated with a fluid that helps to create an erection, and they can be deflated after intercourse. There are several types of inflatable implants. °Tell a health care provider about: °· Any allergies you have. °· All medicines you are taking, including vitamins, herbs, eye drops, creams, and over-the-counter medicines. °· Any problems you or family members have had with anesthetic medicines. °· Any blood disorders you have. °· Any surgeries you have had. °· Any medical conditions you have. °What are the risks? °Generally, this is a safe procedure. However, problems may occur, including: °· Infection in the penis. If this happens, the implant may need to be removed. °· Bleeding. °· Allergic reaction to medicines. °· Damage to other structures or organs, such as the tube that drains urine from the body (urethra). °· Not enough blood reaching the penis. This is rare. If this happens, the implant will need to be removed. ° °What happens before the procedure? °Medicines °· Ask your health care provider about: °? Changing or stopping your regular medicines. This is especially important if you are taking diabetes medicines or blood thinners. °? Taking medicines such as aspirin and ibuprofen.  These medicines can thin your blood. Do not take these medicines before your procedure if your health care provider instructs you not to. °Staying hydrated °Follow instructions from your health care provider about hydration, which may include: °· Up to 2 hours before the procedure - you may continue to drink clear liquids, such as water, clear fruit juice, black coffee, and plain tea. ° °Eating and drinking restrictions °Follow instructions from your health care provider about eating and drinking, which may include: °· 8 hours before the procedure - stop eating heavy meals or foods such as meat, fried foods, or fatty foods. °· 6 hours before the procedure - stop eating light meals or foods, such as toast or cereal. °· 6 hours before the procedure - stop drinking milk or drinks that contain milk. °· 2 hours before the procedure - stop drinking clear liquids. ° °General instructions °· You may be asked to shower with a germ-killing soap. °· Plan to have someone take you home from the hospital or clinic. °· If you will be going home right after the procedure, plan to have someone with you for 24 hours. °What happens during the procedure? °· To lower your risk of infection: °? Your health care team will wash or sanitize their hands. °? Hair may be removed from the surgical area. °? Your skin will be washed with soap. °? You may be given antibiotic medicine. °· An IV tube will be inserted into one of your veins. °· You will be given one or more of the following: °? A medicine to make you fall asleep (general anesthetic). °? A medicine that is injected into your spine to numb the area below   and slightly above the injection site (spinal anesthetic). °· A flexible tube (catheter) may be inserted into your urethra and bladder. The catheter drains urine during the procedure and helps your surgeon easily locate your urethra. °· A small incision will be made in your scrotum or in your penis, just below the head of your  penis. °· The cylinders of the prosthesis will be put into tissue on each side of your penis. °· If you will have an inflatable penile implant: °? Incisions will be made in your abdomen and in your scrotum. These incisions will be used to insert the pump and the reservoir. °? The cylinders, reservoir, and pump will be joined by tubes and tested. °· Your incision(s) will be closed with dissolvable stitches (sutures). °· A bandage (dressing) will be applied to your incision(s). °· You may be fitted with a device similar to a jock strap or underwear with a supportive pouch (scrotal support) to relieve pressure on the incision area. °The procedure may vary among health care providers and hospitals. °What happens after the procedure? °· Your blood pressure, heart rate, breathing rate, and blood oxygen level will be monitored until the medicines you were given have worn off. °· If you have a catheter in place, it may stay in place for the day after the procedure. °· You may be given antibiotics or pain medicines as needed. °· You may need to follow a clear liquid diet for the first 24 hours after the procedure. °· You may be encouraged to sit up and walk around. °· A towel roll or an ice pack may be placed under your scrotum to help reduce swelling. °· Do not drive for 24 hours if you were given a sedative. °Summary °· Penile prosthesis implantation is a procedure to put a device that treats erectile dysfunction into the penis. °· There are two main types of devices that can be put in during the procedure: malleable penile implants and inflatable penile implants. °· After the procedure, you may be fitted with a device similar to a jock strap or underwear with a supportive pouch (scrotal support) to relieve pressure on the incision area. °This information is not intended to replace advice given to you by your health care provider. Make sure you discuss any questions you have with your health care provider. °Document  Released: 10/16/2007 Document Revised: 04/20/2016 Document Reviewed: 04/20/2016 °Elsevier Interactive Patient Education © 2018 [employer] Inc. ° °

## 2017-02-23 ENCOUNTER — Other Ambulatory Visit: Payer: Self-pay | Admitting: Internal Medicine

## 2017-02-25 NOTE — Patient Instructions (Addendum)
  Test(s) ordered today. Your results will be released to [facility] (or called to you) after review, usually within 72hours after test completion. If any changes need to be made, you will be notified at that same time.  Medications reviewed and updated.  Changes include starting lisinopril 5 mg daily.  Your prescription(s) have been submitted to your pharmacy. Please take as directed and contact our office if you believe you are having problem(s) with the medication(s).   Please followup in 6 months for a physical

## 2017-02-25 NOTE — Discharge Summary (Signed)
Physician Discharge Summary  Patient ID: Derrick Walker MRN: [id number] DOB/AGE: 12-31-1955 61 y.o.  Admit date: 02/21/2017 Discharge date: 02/22/2017  Admission Diagnoses: Erectile dysfunction Discharge Diagnoses:  Active Problems:   Erectile dysfunction   Discharged Condition: good  Hospital Course: The patient tolerated the procedure well and was transferred to the floor on IV pain meds, IV fluid. On POD#1 foley was removed, pt was started on a regular diet and they ambulated in the halls. Prior to discharge the pt was tolerating a regular diet, pain was controlled on PO pain meds, they were ambulating without difficulty, and they had normal bowel function.   Consults: None  Significant Diagnostic Studies: none  Treatments: surgery: revision of IPP  Discharge Exam: Blood pressure (!) 141/70, pulse 86, temperature 98 F (36.7 C), temperature source Oral, resp. rate 17, height 5\' 11"  (1.803 m), weight 104.3 kg (230 lb), SpO2 99 %. General appearance: alert, cooperative and appears stated age Eyes: conjunctivae/corneas clear. PERRL, EOM's intact. Fundi benign. Nose: Nares normal. Septum midline. Mucosa normal. No drainage or sinus tenderness. Resp: clear to auscultation bilaterally Cardio: regular rate and rhythm, S1, S2 normal, no murmur, click, rub or gallop GI: soft, non-tender; bowel sounds normal; no masses,  no organomegaly Extremities: extremities normal, atraumatic, no cyanosis or edema Neurologic: Grossly normal  Disposition: 01-Home or Self Care  Discharge Instructions    Discharge patient    Complete by:  As directed    Discharge disposition:  01-Home or Self Care   Discharge patient date:  02/22/2017     Allergies as of 02/22/2017      Reactions   Shellfish Allergy Anaphylaxis   Oxycodone Hives   Able to take percocet without problems   Losartan Other (See Comments)   Kidney pain, leg swelling      Medication List    TAKE these medications    acetaminophen 500 MG tablet Commonly known as:  TYLENOL Take 1,000 mg by mouth every 6 (six) hours as needed for moderate pain.   canagliflozin 100 MG Tabs tablet Commonly known as:  INVOKANA Take 1 tablet (100 mg total) by mouth daily before breakfast.   EPINEPHrine 0.3 mg/0.3 mL Soaj injection Commonly known as:  EPI-PEN Inject 0.3 mg into the muscle as needed.   hydrochlorothiazide 25 MG tablet Commonly known as:  HYDRODIURIL Take 1 tablet (25 mg total) by mouth daily.   ibuprofen 200 MG tablet Commonly known as:  ADVIL,MOTRIN Take 200 mg by mouth every 6 (six) hours as needed for moderate pain.   metFORMIN 1000 MG tablet Commonly known as:  GLUCOPHAGE Take 1 tablet (1,000 mg total) by mouth 2 (two) times daily with a meal.   oxyCODONE-acetaminophen 5-325 MG tablet Commonly known as:  ROXICET Take 1 tablet by mouth every 4 (four) hours as needed.   sulfamethoxazole-trimethoprim 800-160 MG tablet Commonly known as:  BACTRIM DS,SEPTRA DS Take 1 tablet by mouth 2 (two) times daily.   traMADol 50 MG tablet Commonly known as:  ULTRAM Take 50 mg by mouth every 6 (six) hours as needed for moderate pain or severe pain.      Follow-up Information    Jerrit Horen, Candee Furbish, MD. Call in 2 week(s).   Specialty:  Urology Contact information: [address] [place] [address] [phone]           Signed: Nicolette Bang 02/25/2017, 7:50 AM

## 2017-02-25 NOTE — Op Note (Addendum)
Preoperative diagnosis: malfunctioning IPP  Postoperative diagnosis: Same  Procedure: 1. Revision of IPP with replacement of the reservoir  Attending: Nicolette Bang, MD  Anesthesia: General  History of blood loss: Minimal  Antibiotics: Vancomycin and Gentamicin  Drains: 16 french foley  Specimens: none  Findings: 100cc conceal reservoir placed on the left. Leak detectable at barrel connector. No deformity or curvature on cycling of the device  Indications: Patient is a 61 year old male with a history of erectile dysfunction who has failed medical therapy.  He underwent IPP and developed a leak and the the prosthesis will not cycle correctly.  We discussed the treatment options and he has elected to pursue penile prosthesis insertion.   Procedure in detail: Prior to procedure consent was obtained.  Patient was brought to the operating room and a brief timeout was done to ensure correct patient, correct procedure, correct site.  General anesthesia was administered and patient was placed in supine position. We performed a 10 minute scrub of his genitalia prior to the using alcohol prep.   His genitalia and abdomen was then prepped and draped in usual sterile fashion.  A 16 French foley catheter was placed and the bladder was drained. The penile was then placed on stretch with the aid of a hook through the meatus attached to the lonestar retractor. A 4 cm incision was made at the penoscrotal junction.  We placed shods on the tubing to the reservoir then sharply incised the tubing. We then cycled the pump and the device cycled properly.  We then turned our attention to replacing the reservoir. We deflated the reservoir which only had 65cc of saline and then cut the tubing. We then used blunt dissection under the rectus muscle and above the peritoneal cavity. We then placed a 100cc conceal reservoir and inflated it with 90cc of saline.  . The tubing was cut to length and then the locking clips  were placed on either end of the tubing. A barrel connector was then placed on on end of the tubing. The tubing was then irrigated with normal saline to ensure no air bubbles in the tubing. The free end of the barrel was then attached to the reservoir tubing and using the crimping tool the barrel was secured to the tubing. We then closed the dartos over the tubing suing a 2-0 vicryl in a running fashion. We then closed a second layer of dartos over the tubing. The skin was then closed with 4-0 monocryl in a running fashion. Skin glue was then placed over the incision. We then removed the strings attached to the distal ends of the cylinders and placed skin glue over the incisions in the glans. The device was then cycled to partially erect.  We then placed a scrotal fluff and this then concluded the procedure which was well tolerated by the patient.  Complications: None  Condition: Stable, extubated, transferred to PACU.  Plan: Patient is to be admitted overnight for IV antibiotics.  His foley will be removed in the morning and the device will be deactivated. He will followup in 2 weeks for a wound check. He will be discharged with 1 week of antibiotics.

## 2017-02-25 NOTE — Progress Notes (Signed)
Subjective:    Patient ID: Derrick Walker, male    DOB: 02-23-1956, 61 y.o.   MRN: [id number]  HPI The patient is here for follow up.  Hypertension: He is taking his medication daily. He is compliant with a low sodium diet.  He denies chest pain, palpitations, edema, shortness of breath and regular headaches. He is exercising regularly, except for right now due to surgery.  He does not monitor his blood pressure at home.    Diabetes: He is taking his medication daily as prescribed. He is compliant with a diabetic diet. He is exercising regularly. He monitors his sugars and they have been running 96-122. He checks his feet daily and denies foot lesions. He is up-to-date with an ophthalmology examination.   Mild obesity:  He is exercising regularly - this is temporarily on hold since he just had revision of his IPP surgery.  He is eating healthy and trying to eat small portions.   Medications and allergies reviewed with patient and updated if appropriate.  Patient Active Problem List   Diagnosis Date Noted  . Inguinal hernia 01/14/2017  . Right inguinal hernia 12/25/2016  . Erectile dysfunction 11/19/2016  . Diabetes ([place]) 12/15/2015  . Malignant neoplasm of prostate ([place]) 04/09/2014  . Abnormal finding on EKG 09/17/2013  . LVH (left ventricular hypertrophy) - an EKG 09/17/2013  . Hypertension, essential   . Mild obesity     Current Outpatient Prescriptions on File Prior to Visit  Medication Sig Dispense Refill  . acetaminophen (TYLENOL) 500 MG tablet Take 1,000 mg by mouth every 6 (six) hours as needed for moderate pain.     Marland Kitchen EPINEPHrine 0.3 mg/0.3 mL IJ SOAJ injection Inject 0.3 mg into the muscle as needed.     . hydrochlorothiazide (HYDRODIURIL) 25 MG tablet Take 1 tablet (25 mg total) by mouth daily. 90 tablet 3  . metFORMIN (GLUCOPHAGE) 1000 MG tablet Take 1 tablet (1,000 mg total) by mouth 2 (two) times daily with a meal. 180 tablet 3  . oxyCODONE-acetaminophen (ROXICET)  5-325 MG tablet Take 1 tablet by mouth every 4 (four) hours as needed. 30 tablet 0   No current facility-administered medications on file prior to visit.     Past Medical History:  Diagnosis Date  . Erectile dysfunction   . History of prostate cancer urologist-  dr Orpah Melter   dx 05/ 2015 -- moderate risk Stage T2c, Gleason 3+4,  vol 152.41cc---  09/ 2015 s/p  radical prostatectomy -- per pt last PSA 07-15-2016  undectable  . Hypertension, essential   . Right inguinal hernia    from penile prosthesis reservior post surgery 11-19-2016  . Right inguinal pain   . Type 2 diabetes mellitus ([place])     Past Surgical History:  Procedure Laterality Date  . INGUINAL HERNIA REPAIR Right 01/14/2017   Procedure: HERNIA REPAIR INGUINAL ADULT;  Surgeon: Cleon Gustin, MD;  Location: [facility];  Service: Urology;  Laterality: Right;  . LYMPHADENECTOMY Bilateral 04/09/2014   Procedure: BILATERAL PELVIC LYMPH NODE DISSECTION;  Surgeon: Alexis Frock, MD;  Location: [facility];  Service: Urology;  Laterality: Bilateral;  . PENILE PROSTHESIS IMPLANT N/A 11/19/2016   Procedure: AMS PENILE PROTHESIS INFLATABLE;  Surgeon: Cleon Gustin, MD;  Location[facility];  Service: Urology;  Laterality: N/A;  . PENILE PROSTHESIS IMPLANT Right 01/14/2017   Procedure: REVISION PENILE PROSTHESIS;  Surgeon: Cleon Gustin, MD;  Location: [facility];  Service: Urology;  Laterality: Right;  . PENILE PROSTHESIS IMPLANT N/A 02/21/2017   Procedure: PENILE PROSTHESIS REVISION;  Surgeon: Cleon Gustin, MD;  Location: [facility];  Service: Urology;  Laterality: N/A;  . PROSTATE BIOPSY N/A 12/11/2013   Procedure: BIOPSY TRANSRECTAL ULTRASONIC PROSTATE (TUBP);  Surgeon: Arvil Persons, MD;  Location[facility];  Service: Urology;  Laterality: N/A;  . ROBOT ASSISTED LAPAROSCOPIC RADICAL PROSTATECTOMY N/A 04/09/2014   Procedure:  ROBOTIC ASSISTED LAPAROSCOPIC RADICAL PROSTATECTOMY, INDOCYANINE GREEN DYE ;  Surgeon: Alexis Frock, MD;  Location: [facility];  Service: Urology;  Laterality: N/A;  . TRANSTHORACIC ECHOCARDIOGRAM  10-14-2013   dr Shanon Brow harding   mild LVH/  ef  78-29%/  grade I diastolic dysfunction/  mild LAE    Social History   Social History  . Marital status: Single    Spouse name: N/A  . Number of children: 3  . Years of education: N/A   Occupational History  .  [employer]   Social History Main Topics  . Smoking status: Never Smoker  . Smokeless tobacco: Never Used  . Alcohol use No  . Drug use: No  . Sexual activity: Not Asked   Other Topics Concern  . None   Social History Narrative   He is a divorced father of 3. 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 He has finished his Masters is now working on his PhD. He never smoked. He drinks about 3 glasses of wine with food a week. He uses to help digest food.   He is Jewish and into [employer] does not eat any pork/ham and only a small amount of beef.   Contact information:   Son: Nayshawn Mesta: [phone]   Daughter: Jhostin Epps: [phone]    Family History  Problem Relation Age of Onset  . Hypertension Mother   . Hypertension Maternal Grandmother   . Hypertension Paternal Grandmother   . Kidney failure Father   . Hypertension Brother   . Colon cancer Neg Hx   . Colon polyps Neg Hx   . Esophageal cancer Neg Hx   . Rectal cancer Neg Hx   . Stomach cancer Neg Hx     Review of Systems  Constitutional: Negative for chills and fever.  Respiratory: Negative for cough, shortness of breath and wheezing.   Cardiovascular: Negative for chest pain, palpitations and leg swelling.  Neurological: Negative for dizziness, light-headedness and headaches.       Objective:   Vitals:   02/26/17 0833  BP: (!) 150/92  Pulse: 90  Resp: 16  Temp: 97.9 F  (36.6 C)   Wt Readings from Last 3 Encounters:  02/26/17 239 lb (108.4 kg)  02/21/17 230 lb (104.3 kg)  01/14/17 246 lb 14.6 oz (112 kg)   Body mass index is 33.33 kg/m.   Physical Exam    Constitutional: Appears well-developed and well-nourished. No distress.  HENT:  Head: Normocephalic and atraumatic.  Neck: Neck supple. No tracheal deviation present. No thyromegaly present.  No cervical lymphadenopathy Cardiovascular: Normal rate, regular rhythm and normal heart sounds.   No murmur heard. No carotid bruit .  No edema Pulmonary/Chest: Effort normal and breath sounds normal. No respiratory distress. No has no wheezes. No rales.  Skin: Skin is warm and dry. Not diaphoretic.  Psychiatric: Normal mood and affect. Behavior is normal.      Assessment & Plan:  See Problem List for Assessment and Plan of chronic medical problems.

## 2017-02-26 ENCOUNTER — Other Ambulatory Visit (INDEPENDENT_AMBULATORY_CARE_PROVIDER_SITE_OTHER): Payer: Medicare HMO

## 2017-02-26 ENCOUNTER — Ambulatory Visit (INDEPENDENT_AMBULATORY_CARE_PROVIDER_SITE_OTHER): Payer: Medicare HMO | Admitting: Internal Medicine

## 2017-02-26 ENCOUNTER — Encounter: Payer: Self-pay | Admitting: Internal Medicine

## 2017-02-26 VITALS — BP 150/92 | HR 90 | Temp 97.9°F | Resp 16 | Ht 71.0 in | Wt 239.0 lb

## 2017-02-26 DIAGNOSIS — E119 Type 2 diabetes mellitus without complications: Secondary | ICD-10-CM

## 2017-02-26 DIAGNOSIS — R69 Illness, unspecified: Secondary | ICD-10-CM | POA: Diagnosis not present

## 2017-02-26 DIAGNOSIS — E669 Obesity, unspecified: Secondary | ICD-10-CM | POA: Diagnosis not present

## 2017-02-26 DIAGNOSIS — I10 Essential (primary) hypertension: Secondary | ICD-10-CM | POA: Diagnosis not present

## 2017-02-26 LAB — COMPREHENSIVE METABOLIC PANEL
ALK PHOS: 34 U/L — AB (ref 39–117)
ALT: 31 U/L (ref 0–53)
AST: 19 U/L (ref 0–37)
Albumin: 4 g/dL (ref 3.5–5.2)
BUN: 19 mg/dL (ref 6–23)
CO2: 27 mEq/L (ref 19–32)
Calcium: 10.3 mg/dL (ref 8.4–10.5)
Chloride: 105 mEq/L (ref 96–112)
Creatinine, Ser: 1.16 mg/dL (ref 0.40–1.50)
GFR: 82.67 mL/min (ref >60.00)
GLUCOSE: 104 mg/dL — AB (ref 70–99)
POTASSIUM: 3.6 meq/L (ref 3.5–5.1)
Sodium: 139 mEq/L (ref 135–145)
TOTAL PROTEIN: 6.7 g/dL (ref 6.0–8.3)
Total Bilirubin: 0.5 mg/dL (ref 0.2–1.2)

## 2017-02-26 LAB — HEMOGLOBIN A1C: HEMOGLOBIN A1C: 6.4 % (ref 4.6–6.5)

## 2017-02-26 LAB — LIPID PANEL
Cholesterol: 161 mg/dL (ref 0–200)
HDL: 44 mg/dL (ref >39.00)
LDL Cholesterol: 91 mg/dL (ref 0–99)
NONHDL: 116.95
Total CHOL/HDL Ratio: 4
Triglycerides: 131 mg/dL (ref 0.0–149.0)
VLDL: 26.2 mg/dL (ref 0.0–40.0)

## 2017-02-26 LAB — MICROALBUMIN / CREATININE URINE RATIO
CREATININE, U: 177.1 mg/dL
MICROALB UR: 2.5 mg/dL — AB (ref 0.0–1.9)
Microalb Creat Ratio: 1.4 mg/g (ref 0.0–30.0)

## 2017-02-26 MED ORDER — GLUCOSE BLOOD VI STRP: ORAL_STRIP | 3 refills | Status: DC

## 2017-02-26 MED ORDER — LISINOPRIL 5 MG PO TABS: 5.0000 mg | ORAL_TABLET | Freq: Every day | ORAL | 3 refills | Status: DC

## 2017-02-26 MED ORDER — CANAGLIFLOZIN 100 MG PO TABS: 100.0000 mg | ORAL_TABLET | Freq: Every day | ORAL | 1 refills | Status: DC

## 2017-02-26 NOTE — Assessment & Plan Note (Signed)
BP Readings from Last 3 Encounters:  02/26/17 (!) 150/92  02/22/17 (!) 141/70  01/15/17 132/70    Not ideally controlled Continue hctz Add lisinopril 5 mg daily - will monitor for SE that he had with losartan Recent cmp normal

## 2017-02-26 NOTE — Assessment & Plan Note (Signed)
Working on weight loss - has lost weight Continue regular exercise Continue healthy diet, small portions Goal is to lose 30 lbs F/u in 6 months

## 2017-02-26 NOTE — Assessment & Plan Note (Addendum)
Check a1c, urine microalbumin Low sugar / carb diet Stressed regular exercise 

## 2017-02-27 ENCOUNTER — Encounter: Payer: Self-pay | Admitting: Internal Medicine

## 2017-03-01 ENCOUNTER — Encounter: Payer: Self-pay | Admitting: Internal Medicine

## 2017-03-01 DIAGNOSIS — N5231 Erectile dysfunction following radical prostatectomy: Secondary | ICD-10-CM | POA: Diagnosis not present

## 2017-03-08 DIAGNOSIS — R69 Illness, unspecified: Secondary | ICD-10-CM | POA: Diagnosis not present

## 2017-04-09 ENCOUNTER — Other Ambulatory Visit: Payer: Self-pay | Admitting: Internal Medicine

## 2017-04-14 DIAGNOSIS — R69 Illness, unspecified: Secondary | ICD-10-CM | POA: Diagnosis not present

## 2017-05-22 ENCOUNTER — Other Ambulatory Visit: Payer: Self-pay | Admitting: Emergency Medicine

## 2017-05-22 MED ORDER — LISINOPRIL 5 MG PO TABS: 5.0000 mg | ORAL_TABLET | Freq: Every day | ORAL | 2 refills | Status: DC

## 2017-05-31 ENCOUNTER — Encounter: Payer: Self-pay | Admitting: Internal Medicine

## 2017-07-18 ENCOUNTER — Other Ambulatory Visit: Payer: Self-pay | Admitting: Internal Medicine

## 2017-07-22 ENCOUNTER — Other Ambulatory Visit: Payer: Self-pay | Admitting: Emergency Medicine

## 2017-07-22 MED ORDER — LISINOPRIL 5 MG PO TABS: 5.0000 mg | ORAL_TABLET | Freq: Every day | ORAL | 0 refills | Status: DC

## 2017-09-02 ENCOUNTER — Encounter: Payer: Self-pay | Admitting: Internal Medicine

## 2017-09-04 NOTE — Addendum Note (Signed)
Addended by: Binnie Rail on: 09/04/2017 08:23 AM   Modules accepted: Orders

## 2017-09-07 MED ORDER — DAPAGLIFLOZIN PROPANEDIOL 10 MG PO TABS: 10.0000 mg | ORAL_TABLET | Freq: Every day | ORAL | 1 refills | Status: DC

## 2017-09-07 NOTE — Addendum Note (Signed)
Addended by: Binnie Rail on: 09/07/2017 11:09 AM   Modules accepted: Orders

## 2017-09-17 ENCOUNTER — Encounter: Payer: Self-pay | Admitting: Internal Medicine

## 2017-09-18 DIAGNOSIS — R69 Illness, unspecified: Secondary | ICD-10-CM | POA: Diagnosis not present

## 2017-09-18 MED ORDER — BLOOD GLUCOSE MONITOR KIT: PACK | 0 refills | Status: DC

## 2017-09-18 MED ORDER — GLUCOSE BLOOD VI STRP: ORAL_STRIP | 3 refills | Status: DC

## 2017-09-18 MED ORDER — ONETOUCH ULTRASOFT LANCETS MISC: 12 refills | Status: DC

## 2017-09-18 NOTE — Addendum Note (Signed)
Addended by: Terence Lux B on: 09/18/2017 04:09 PM   Modules accepted: Orders

## 2017-09-20 DIAGNOSIS — R69 Illness, unspecified: Secondary | ICD-10-CM | POA: Diagnosis not present

## 2017-09-22 ENCOUNTER — Telehealth: Payer: Self-pay | Admitting: Internal Medicine

## 2017-09-22 NOTE — Telephone Encounter (Signed)
He is due for follow up -- please call him - schedule a CPE

## 2017-09-23 NOTE — Telephone Encounter (Signed)
LVM for patient to call and set up his CPE

## 2017-09-27 ENCOUNTER — Other Ambulatory Visit: Payer: Self-pay | Admitting: Internal Medicine

## 2017-10-22 DIAGNOSIS — R69 Illness, unspecified: Secondary | ICD-10-CM | POA: Diagnosis not present

## 2017-10-30 ENCOUNTER — Telehealth: Payer: Self-pay | Admitting: Internal Medicine

## 2017-10-30 NOTE — Telephone Encounter (Signed)
Copied from [place] [phone]. Topic: General - Other >> Oct 30, 2017 12:47 PM Margot Ables wrote: Reason for CRM: pt called [facility] and was advised shingrix (both) are covered at 100%. Pt is asking if he can schedule appt to come in. Please advise.

## 2017-10-30 NOTE — Telephone Encounter (Signed)
Patient has been added to waitlist---I will call him when available to get

## 2017-11-25 ENCOUNTER — Other Ambulatory Visit: Payer: Self-pay | Admitting: Internal Medicine

## 2017-11-28 ENCOUNTER — Telehealth: Payer: Self-pay | Admitting: Internal Medicine

## 2017-11-28 ENCOUNTER — Telehealth: Payer: Self-pay | Admitting: Emergency Medicine

## 2017-11-28 NOTE — Telephone Encounter (Signed)
Copied from [place] [id number]. Topic: Quick Communication - See Telephone Encounter >> Nov 28, 2017 10:47 AM Aurelio Brash B wrote: CRM for notification. See Telephone encounter for: 11/28/17. PT Called to speak with Dr Quay Burow nurses, he wants her to know that he made an apt with Cardiologist, Dr Ellyn Hack for [facility] June

## 2017-11-28 NOTE — Telephone Encounter (Signed)
Contact pt to schedule CPE with Dr Quay Burow. Pt chose to contact Cardiology bc he felt they should be filling BP meds. Advised that Dr Quay Burow has been filling them, as well as the Metformin. States he will back back for appt. Pt is aware that refills wont be sent in until appt is made.

## 2017-11-28 NOTE — Telephone Encounter (Signed)
Spoke with pt, CPE scheduled with Dr Quay Burow

## 2017-12-06 ENCOUNTER — Telehealth: Payer: Self-pay

## 2017-12-06 NOTE — Telephone Encounter (Signed)
Left voicemail asking patient to call back to schedule nurse visit to get first shingrix vaccine---can talk with Ramie Palladino,RN at [facility] office if needed

## 2017-12-09 ENCOUNTER — Telehealth: Payer: Self-pay | Admitting: Internal Medicine

## 2017-12-09 NOTE — Telephone Encounter (Signed)
Copied from [place] [id number]. Topic: Quick Communication - Office Called Patient >> Dec 06, 2017 11:25 AM Ander Slade, RN wrote: Reason for CRM: patient needs to schedule nurse visit to get first shingrix---let tamara,RN at [place] office know if appt is made so that both vaccines can be labeled, can talk with tamara,RN if any other questions    Pt has scheduled first shot on 12/11/17.

## 2017-12-09 NOTE — Telephone Encounter (Signed)
Both vaccines have been labeled and placed in refrig 

## 2017-12-11 ENCOUNTER — Ambulatory Visit (INDEPENDENT_AMBULATORY_CARE_PROVIDER_SITE_OTHER): Payer: Medicare HMO

## 2017-12-11 DIAGNOSIS — Z299 Encounter for prophylactic measures, unspecified: Secondary | ICD-10-CM

## 2017-12-19 ENCOUNTER — Encounter: Payer: Self-pay | Admitting: Internal Medicine

## 2017-12-24 ENCOUNTER — Encounter: Payer: Self-pay | Admitting: Internal Medicine

## 2018-01-14 ENCOUNTER — Other Ambulatory Visit: Payer: Self-pay | Admitting: Internal Medicine

## 2018-01-15 ENCOUNTER — Ambulatory Visit: Payer: Self-pay | Admitting: Cardiology

## 2018-01-20 ENCOUNTER — Encounter: Payer: Self-pay | Admitting: Internal Medicine

## 2018-01-21 NOTE — Progress Notes (Signed)
Subjective:    Patient ID: Derrick Walker, male    DOB: 06-May-1956, 62 y.o.   MRN: [id number]  HPI He is here for a physical exam.   He is exercising 6 days a week.  He is eating healthy. .  He has lost weight.  Overall he feels well and has no concerns except for his blood pressure.  He is no longer taking lisinopril.  He ran out of the medication and when he did run out of it he noticed that left chest wall cramps had stopped and he wonders if medication may have caused that.  He does not monitor his blood pressure at home.  Medications and allergies reviewed with patient and updated if appropriate.  Patient Active Problem List   Diagnosis Date Noted  . Inguinal hernia 01/14/2017  . Right inguinal hernia 12/25/2016  . Erectile dysfunction 11/19/2016  . Diabetes ([place]) 12/15/2015  . Malignant neoplasm of prostate ([place]) 04/09/2014  . Abnormal finding on EKG 09/17/2013  . LVH (left ventricular hypertrophy) - an EKG 09/17/2013  . Hypertension, essential   . Mild obesity     Current Outpatient Medications on File Prior to Visit  Medication Sig Dispense Refill  . dapagliflozin propanediol (FARXIGA) 10 MG TABS tablet Take 10 mg by mouth daily. 90 tablet 1  . EPINEPHrine 0.3 mg/0.3 mL IJ SOAJ injection Inject 0.3 mg into the muscle as needed.     . hydrochlorothiazide (HYDRODIURIL) 25 MG tablet Take 1 tablet (25 mg total) by mouth daily. 90 tablet 0  . metFORMIN (GLUCOPHAGE) 1000 MG tablet Take 1 tablet (1,000 mg total) by mouth 2 (two) times daily with a meal. 180 tablet 3  . ONETOUCH DELICA LANCETS 78G MISC   12   No current facility-administered medications on file prior to visit.     Past Medical History:  Diagnosis Date  . Erectile dysfunction   . History of prostate cancer urologist-  dr Orpah Melter   dx 05/ 2015 -- moderate risk Stage T2c, Gleason 3+4,  vol 152.41cc---  09/ 2015 s/p  radical prostatectomy -- per pt last PSA 07-15-2016  undectable  .  Hypertension, essential   . Right inguinal hernia    from penile prosthesis reservior post surgery 11-19-2016  . Right inguinal pain   . Type 2 diabetes mellitus ([place])     Past Surgical History:  Procedure Laterality Date  . INGUINAL HERNIA REPAIR Right 01/14/2017   Procedure: HERNIA REPAIR INGUINAL ADULT;  Surgeon: Cleon Gustin, MD;  Location[facility];  Service: Urology;  Laterality: Right;  . LYMPHADENECTOMY Bilateral 04/09/2014   Procedure: BILATERAL PELVIC LYMPH NODE DISSECTION;  Surgeon: Alexis Frock, MD;  Location: [facility];  Service: Urology;  Laterality: Bilateral;  . PENILE PROSTHESIS IMPLANT N/A 11/19/2016   Procedure: AMS PENILE PROTHESIS INFLATABLE;  Surgeon: Cleon Gustin, MD;  [facility];  Service: Urology;  Laterality: N/A;  . PENILE PROSTHESIS IMPLANT Right 01/14/2017   Procedure: REVISION PENILE PROSTHESIS;  Surgeon: Cleon Gustin, MD;  Location: [facility];  Service: Urology;  Laterality: Right;  . PENILE PROSTHESIS IMPLANT N/A 02/21/2017   Procedure: PENILE PROSTHESIS REVISION;  Surgeon: Cleon Gustin, MD;  Location: [facility];  Service: Urology;  Laterality: N/A;  . PROSTATE BIOPSY N/A 12/11/2013   Procedure: BIOPSY TRANSRECTAL ULTRASONIC PROSTATE (TUBP);  Surgeon: Arvil Persons, MD;  Location: [facility];  Service: Urology;  Laterality: N/A;  .  ROBOT ASSISTED LAPAROSCOPIC RADICAL PROSTATECTOMY N/A 04/09/2014   Procedure: ROBOTIC ASSISTED LAPAROSCOPIC RADICAL PROSTATECTOMY, INDOCYANINE GREEN DYE ;  Surgeon: Alexis Frock, MD;  Location: [facility];  Service: Urology;  Laterality: N/A;  . TRANSTHORACIC ECHOCARDIOGRAM  10-14-2013   dr Shanon Brow harding   mild LVH/  ef  52-77%/  grade I diastolic dysfunction/  mild LAE    Social History   Socioeconomic History  . Marital status: Single    Spouse name: Not on file  . Number of children: 3  . Years of education: Not on  file  . Highest education level: Not on file  Occupational History    Employer: [employer]  Social Needs  . Financial resource strain: Not on file  . Food insecurity:    Worry: Not on file    Inability: Not on file  . Transportation needs:    Medical: Not on file    Non-medical: Not on file  Tobacco Use  . Smoking status: Never Smoker  . Smokeless tobacco: Never Used  Substance and Sexual Activity  . Alcohol use: No    Alcohol/week: 4.2 oz    Types: 7 Standard drinks or equivalent per week  . Drug use: No  . Sexual activity: Not on file  Lifestyle  . Physical activity:    Days per week: Not on file    Minutes per session: Not on file  . Stress: Not on file  Relationships  . Social connections:    Talks on phone: Not on file    Gets together: Not on file    Attends religious service: Not on file    Active member of club or organization: Not on file    Attends meetings of clubs or organizations: Not on file    Relationship status: Not on file  Other Topics Concern  . Not on file  Social History Narrative   He is a divorced father of 3. 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 He has finished his Masters is now working on his PhD. He never smoked. He drinks about 3 glasses of wine with food a week. He uses to help digest food.   He is Jewish and into [employer] does not eat any pork/ham and only a small amount of beef.   Contact information:   Son: Elhadj Girton: [phone]   Daughter: Tremaine Earwood: [phone]    Family History  Problem Relation Age of Onset  . Hypertension Mother   . Hypertension Maternal Grandmother   . Hypertension Paternal Grandmother   . Kidney failure Father   . Hypertension Brother   . Colon cancer Neg Hx   . Colon polyps Neg Hx   . Esophageal cancer Neg Hx   . Rectal cancer Neg Hx   . Stomach cancer Neg Hx     Review of Systems  Constitutional: Negative  for chills, fatigue and fever.  Eyes: Negative for visual disturbance.  Respiratory: Negative for cough, shortness of breath and wheezing.   Cardiovascular: Positive for leg swelling (mild). Negative for chest pain and palpitations.  Gastrointestinal: Negative for abdominal pain, blood in stool, constipation, diarrhea and nausea.       No gerd  Genitourinary: Negative for dysuria and hematuria.  Musculoskeletal: Negative for arthralgias and back pain.  Skin: Negative for color change and rash.  Neurological: Negative for light-headedness and headaches.  Psychiatric/Behavioral: Negative for dysphoric mood. The patient is not nervous/anxious.  Objective:   Vitals:   01/22/18 1453  BP: (!) 152/82  Pulse: 83  Temp: 98.4 F (36.9 C)  SpO2: 97%   Filed Weights   01/22/18 1453  Weight: 226 lb 12.8 oz (102.9 kg)   Body mass index is 31.63 kg/m.  BP Readings from Last 3 Encounters:  01/22/18 (!) 152/82  02/26/17 (!) 150/92  02/22/17 (!) 141/70    Wt Readings from Last 3 Encounters:  01/22/18 226 lb 12.8 oz (102.9 kg)  02/26/17 239 lb (108.4 kg)  02/21/17 230 lb (104.3 kg)     Physical Exam Constitutional: He appears well-developed and well-nourished. No distress.  HENT:  Head: Normocephalic and atraumatic.  Right Ear: External ear normal.  Left Ear: External ear normal.  Mouth/Throat: Oropharynx is clear and moist.  Normal ear canals and TM b/l  Eyes: Conjunctivae and EOM are normal.  Neck: Neck supple. No tracheal deviation present. No thyromegaly present.  No carotid bruit  Cardiovascular: Normal rate, regular rhythm, normal heart sounds and intact distal pulses.   No murmur heard. Pulmonary/Chest: Effort normal and breath sounds normal. No respiratory distress. He has no wheezes. He has no rales.  Abdominal: Soft. He exhibits no distension. There is no tenderness.  Genitourinary: deferred  Musculoskeletal: He exhibits no edema.  Lymphadenopathy:   He has no  cervical adenopathy.  Skin: Skin is warm and dry. He is not diaphoretic.  Psychiatric: He has a normal mood and affect. His behavior is normal.    Diabetic Foot Exam - Simple   Simple Foot Form Diabetic Foot exam was performed with the following findings:  Yes 01/22/2018  3:24 PM  Visual Inspection No deformities, no ulcerations, no other skin breakdown bilaterally:  Yes Sensation Testing Intact to touch and monofilament testing bilaterally:  Yes Pulse Check Posterior Tibialis and Dorsalis pulse intact bilaterally:  Yes Comments         Assessment & Plan:   Physical exam: Screening blood work  ordered Immunizations   Discussed shingrix, others up to date Colonoscopy     -  Never had one - will look into having one in [place] or [place] exams   Not up to date - will schedule EKG     Done 10/2016 Exercise   regular Weight  Working on weight loss Skin   No concerns Substance abuse   none  See Problem List for Assessment and Plan of chronic medical problems.   Follow-up in 6 months

## 2018-01-21 NOTE — Patient Instructions (Addendum)
Test(s) ordered today. Your results will be released to [facility] (or called to you) after review, usually within 72hours after test completion. If any changes need to be made, you will be notified at that same time.  All other Health Maintenance issues reviewed.   All recommended immunizations and age-appropriate screenings are up-to-date or discussed.  No immunizations administered today.   Medications reviewed and updated.  Changes include starting amlodipine for your blood pressure.    Your prescription(s) have been submitted to your pharmacy. Please take as directed and contact our office if you believe you are having problem(s) with the medication(s).   Please followup in 6 months    Health Maintenance, Male A healthy lifestyle and preventive care is important for your health and wellness. Ask your health care provider about what schedule of regular examinations is right for you. What should I know about weight and diet? Eat a Healthy Diet  Eat plenty of vegetables, fruits, whole grains, low-fat dairy products, and lean protein.  Do not eat a lot of foods high in solid fats, added sugars, or salt.  Maintain a Healthy Weight Regular exercise can help you achieve or maintain a healthy weight. You should:  Do at least 150 minutes of exercise each week. The exercise should increase your heart rate and make you sweat (moderate-intensity exercise).  Do strength-training exercises at least twice a week.  Watch Your Levels of Cholesterol and Blood Lipids  Have your blood tested for lipids and cholesterol every 5 years starting at 61 years of age. If you are at high risk for heart disease, you should start having your blood tested when you are 61 years old. You may need to have your cholesterol levels checked more often if: ? Your lipid or cholesterol levels are high. ? You are older than 61 years of age. ? You are at high risk for heart disease.  What should I know about cancer  screening? Many types of cancers can be detected early and may often be prevented. Lung Cancer  You should be screened every year for lung cancer if: ? You are a current smoker who has smoked for at least 30 years. ? You are a former smoker who has quit within the past 15 years.  Talk to your health care provider about your screening options, when you should start screening, and how often you should be screened.  Colorectal Cancer  Routine colorectal cancer screening usually begins at 61 years of age and should be repeated every 5-10 years until you are 61 years old. You may need to be screened more often if early forms of precancerous polyps or small growths are found. Your health care provider may recommend screening at an earlier age if you have risk factors for colon cancer.  Your health care provider may recommend using home test kits to check for hidden blood in the stool.  A small camera at the end of a tube can be used to examine your colon (sigmoidoscopy or colonoscopy). This checks for the earliest forms of colorectal cancer.  Prostate and Testicular Cancer  Depending on your age and overall health, your health care provider may do certain tests to screen for prostate and testicular cancer.  Talk to your health care provider about any symptoms or concerns you have about testicular or prostate cancer.  Skin Cancer  Check your skin from head to toe regularly.  Tell your health care provider about any new moles or changes in moles, especially if: ?  There is a change in a mole's size, shape, or color. ? You have a mole that is larger than a pencil eraser.  Always use sunscreen. Apply sunscreen liberally and repeat throughout the day.  Protect yourself by wearing long sleeves, pants, a wide-brimmed hat, and sunglasses when outside.  What should I know about heart disease, diabetes, and high blood pressure?  If you are 60-10 years of age, have your blood pressure checked  every 3-5 years. If you are 87 years of age or older, have your blood pressure checked every year. You should have your blood pressure measured twice-once when you are at a hospital or clinic, and once when you are not at a hospital or clinic. Record the average of the two measurements. To check your blood pressure when you are not at a hospital or clinic, you can use: ? An automated blood pressure machine at a pharmacy. ? A home blood pressure monitor.  Talk to your health care provider about your target blood pressure.  If you are between 67-62 years old, ask your health care provider if you should take aspirin to prevent heart disease.  Have regular diabetes screenings by checking your fasting blood sugar level. ? If you are at a normal weight and have a low risk for diabetes, have this test once every three years after the age of 33. ? If you are overweight and have a high risk for diabetes, consider being tested at a younger age or more often.  A one-time screening for abdominal aortic aneurysm (AAA) by ultrasound is recommended for men aged 28-75 years who are current or former smokers. What should I know about preventing infection? Hepatitis B If you have a higher risk for hepatitis B, you should be screened for this virus. Talk with your health care provider to find out if you are at risk for hepatitis B infection. Hepatitis C Blood testing is recommended for:  Everyone born from [birth year] through 1964.  Anyone with known risk factors for hepatitis C.  Sexually Transmitted Diseases (STDs)  You should be screened each year for STDs including gonorrhea and chlamydia if: ? You are sexually active and are younger than 61 years of age. ? You are older than 61 years of age and your health care provider tells you that you are at risk for this type of infection. ? Your sexual activity has changed since you were last screened and you are at an increased risk for chlamydia or gonorrhea. Ask your  health care provider if you are at risk.  Talk with your health care provider about whether you are at high risk of being infected with HIV. Your health care provider may recommend a prescription medicine to help prevent HIV infection.  What else can I do?  Schedule regular health, dental, and eye exams.  Stay current with your vaccines (immunizations).  Do not use any tobacco products, such as cigarettes, chewing tobacco, and e-cigarettes. If you need help quitting, ask your health care provider.  Limit alcohol intake to no more than 2 drinks per day. One drink equals 12 ounces of beer, 5 ounces of wine, or 1 ounces of hard liquor.  Do not use street drugs.  Do not share needles.  Ask your health care provider for help if you need support or information about quitting drugs.  Tell your health care provider if you often feel depressed.  Tell your health care provider if you have ever been abused or do not feel  safe at home. This information is not intended to replace advice given to you by your health care provider. Make sure you discuss any questions you have with your health care provider. Document Released: 01/05/2008 Document Revised: 03/07/2016 Document Reviewed: 04/12/2015 Elsevier Interactive Patient Education  [employer].

## 2018-01-22 ENCOUNTER — Encounter: Payer: Self-pay | Admitting: Internal Medicine

## 2018-01-22 ENCOUNTER — Ambulatory Visit (INDEPENDENT_AMBULATORY_CARE_PROVIDER_SITE_OTHER): Payer: Medicare HMO | Admitting: Internal Medicine

## 2018-01-22 ENCOUNTER — Other Ambulatory Visit (INDEPENDENT_AMBULATORY_CARE_PROVIDER_SITE_OTHER): Payer: Medicare HMO

## 2018-01-22 VITALS — BP 152/82 | HR 83 | Temp 98.4°F | Ht 71.0 in | Wt 226.8 lb

## 2018-01-22 DIAGNOSIS — E119 Type 2 diabetes mellitus without complications: Secondary | ICD-10-CM

## 2018-01-22 DIAGNOSIS — I10 Essential (primary) hypertension: Secondary | ICD-10-CM

## 2018-01-22 DIAGNOSIS — Z0000 Encounter for general adult medical examination without abnormal findings: Secondary | ICD-10-CM

## 2018-01-22 LAB — CBC WITH DIFFERENTIAL/PLATELET
BASOS PCT: 0.5 % (ref 0.0–3.0)
Basophils Absolute: 0 10*3/uL (ref 0.0–0.1)
EOS PCT: 0.6 % (ref 0.0–5.0)
Eosinophils Absolute: 0 10*3/uL (ref 0.0–0.7)
HCT: 45.3 % (ref 39.0–52.0)
Hemoglobin: 15.3 g/dL (ref 13.0–17.0)
LYMPHS PCT: 37.5 % (ref 12.0–46.0)
Lymphs Abs: 2.7 10*3/uL (ref 0.7–4.0)
MCHC: 33.8 g/dL (ref 30.0–36.0)
MCV: 90.5 fl (ref 78.0–100.0)
MONO ABS: 0.6 10*3/uL (ref 0.1–1.0)
MONOS PCT: 8.5 % (ref 3.0–12.0)
Neutro Abs: 3.8 10*3/uL (ref 1.4–7.7)
Neutrophils Relative %: 52.9 % (ref 43.0–77.0)
Platelets: 243 10*3/uL (ref 150.0–400.0)
RBC: 5.01 Mil/uL (ref 4.22–5.81)
RDW: 15.1 % (ref 11.5–15.5)
WBC: 7.2 10*3/uL (ref 4.0–10.5)

## 2018-01-22 LAB — COMPREHENSIVE METABOLIC PANEL
ALBUMIN: 4.6 g/dL (ref 3.5–5.2)
ALT: 15 U/L (ref 0–53)
AST: 16 U/L (ref 0–37)
Alkaline Phosphatase: 44 U/L (ref 39–117)
BUN: 21 mg/dL (ref 6–23)
CALCIUM: 11 mg/dL — AB (ref 8.4–10.5)
CO2: 25 meq/L (ref 19–32)
CREATININE: 1.02 mg/dL (ref 0.40–1.50)
Chloride: 101 mEq/L (ref 96–112)
GFR: 95.6 mL/min (ref >60.00)
Glucose, Bld: 80 mg/dL (ref 70–99)
POTASSIUM: 3.8 meq/L (ref 3.5–5.1)
Sodium: 137 mEq/L (ref 135–145)
Total Bilirubin: 1 mg/dL (ref 0.2–1.2)
Total Protein: 7.6 g/dL (ref 6.0–8.3)

## 2018-01-22 LAB — MICROALBUMIN / CREATININE URINE RATIO
CREATININE, U: 110.6 mg/dL
MICROALB/CREAT RATIO: 0.6 mg/g (ref 0.0–30.0)

## 2018-01-22 LAB — LIPID PANEL
CHOL/HDL RATIO: 4
CHOLESTEROL: 198 mg/dL (ref 0–200)
HDL: 44.5 mg/dL (ref >39.00)
LDL CALC: 130 mg/dL — AB (ref 0–99)
NonHDL: 153.68
TRIGLYCERIDES: 118 mg/dL (ref 0.0–149.0)
VLDL: 23.6 mg/dL (ref 0.0–40.0)

## 2018-01-22 LAB — TSH: TSH: 1.39 u[IU]/mL (ref 0.35–4.50)

## 2018-01-22 LAB — HEMOGLOBIN A1C: HEMOGLOBIN A1C: 6.2 % (ref 4.6–6.5)

## 2018-01-22 MED ORDER — AMLODIPINE BESYLATE 5 MG PO TABS: 5.0000 mg | ORAL_TABLET | Freq: Every day | ORAL | 3 refills | Status: DC

## 2018-01-22 NOTE — Assessment & Plan Note (Signed)
Check A1c, urine microalbumin Exercising and compliant with diabetic diet Working on weight loss and has lost weight Continue metformin and Farxiga Follow-up in 6 months

## 2018-01-22 NOTE — Assessment & Plan Note (Signed)
Not controlled Did not tolerate lisinopril due to chest wall cramping Continue hydrochlorothiazide 25 mg daily Trial of amlodipine 5 mg daily-if this does cause any ankle edema we will try Bystolic CMP

## 2018-01-24 ENCOUNTER — Encounter: Payer: Self-pay | Admitting: Internal Medicine

## 2018-01-24 DIAGNOSIS — Z1211 Encounter for screening for malignant neoplasm of colon: Secondary | ICD-10-CM

## 2018-01-26 ENCOUNTER — Encounter: Payer: Self-pay | Admitting: Internal Medicine

## 2018-01-28 ENCOUNTER — Encounter: Payer: Self-pay | Admitting: Internal Medicine

## 2018-02-04 ENCOUNTER — Encounter: Payer: Self-pay | Admitting: Internal Medicine

## 2018-02-04 ENCOUNTER — Ambulatory Visit: Payer: Medicare HMO | Admitting: Cardiology

## 2018-02-10 ENCOUNTER — Ambulatory Visit (INDEPENDENT_AMBULATORY_CARE_PROVIDER_SITE_OTHER): Payer: Medicare HMO

## 2018-02-10 DIAGNOSIS — Z299 Encounter for prophylactic measures, unspecified: Secondary | ICD-10-CM | POA: Diagnosis not present

## 2018-03-11 DIAGNOSIS — R69 Illness, unspecified: Secondary | ICD-10-CM | POA: Diagnosis not present

## 2018-03-16 ENCOUNTER — Other Ambulatory Visit: Payer: Self-pay | Admitting: Internal Medicine

## 2018-03-17 ENCOUNTER — Other Ambulatory Visit: Payer: Self-pay | Admitting: Internal Medicine

## 2018-04-03 ENCOUNTER — Ambulatory Visit: Payer: Medicare HMO | Admitting: Cardiology

## 2018-05-01 ENCOUNTER — Other Ambulatory Visit: Payer: Self-pay | Admitting: Internal Medicine

## 2018-05-28 ENCOUNTER — Ambulatory Visit: Payer: Medicare HMO | Admitting: Cardiology

## 2018-07-23 DIAGNOSIS — A4151 Sepsis due to Escherichia coli [E. coli]: Secondary | ICD-10-CM

## 2018-07-23 HISTORY — DX: Sepsis due to Escherichia coli (e. coli): A41.51

## 2018-08-07 ENCOUNTER — Encounter: Payer: Self-pay | Admitting: Internal Medicine

## 2018-08-18 ENCOUNTER — Other Ambulatory Visit: Payer: Self-pay

## 2018-08-18 ENCOUNTER — Ambulatory Visit (HOSPITAL_COMMUNITY): Payer: Medicare HMO

## 2018-08-18 ENCOUNTER — Ambulatory Visit (HOSPITAL_COMMUNITY): Payer: Medicare HMO | Admitting: Certified Registered Nurse Anesthetist

## 2018-08-18 ENCOUNTER — Encounter (HOSPITAL_COMMUNITY): Payer: Self-pay | Admitting: Internal Medicine

## 2018-08-18 ENCOUNTER — Emergency Department (HOSPITAL_COMMUNITY): Payer: Medicare HMO

## 2018-08-18 ENCOUNTER — Encounter (HOSPITAL_COMMUNITY)
Admission: EM | Disposition: A | Discharge status: Home / Self Care | Payer: Self-pay | Source: Home / Self Care | Attending: Internal Medicine

## 2018-08-18 ENCOUNTER — Inpatient Hospital Stay (HOSPITAL_COMMUNITY)
Admission: EM | Admit: 2018-08-18 | Discharge: 2018-08-20 | DRG: 854 | Disposition: A | Discharge status: Home / Self Care | Payer: Medicare HMO | Attending: Internal Medicine | Admitting: Internal Medicine

## 2018-08-18 DIAGNOSIS — A4150 Gram-negative sepsis, unspecified: Secondary | ICD-10-CM | POA: Diagnosis present

## 2018-08-18 DIAGNOSIS — B9620 Unspecified Escherichia coli [E. coli] as the cause of diseases classified elsewhere: Secondary | ICD-10-CM | POA: Diagnosis present

## 2018-08-18 DIAGNOSIS — N136 Pyonephrosis: Secondary | ICD-10-CM | POA: Diagnosis not present

## 2018-08-18 DIAGNOSIS — Z6829 Body mass index (BMI) 29.0-29.9, adult: Secondary | ICD-10-CM

## 2018-08-18 DIAGNOSIS — Z87442 Personal history of urinary calculi: Secondary | ICD-10-CM

## 2018-08-18 DIAGNOSIS — E872 Acidosis: Secondary | ICD-10-CM | POA: Diagnosis present

## 2018-08-18 DIAGNOSIS — E119 Type 2 diabetes mellitus without complications: Secondary | ICD-10-CM

## 2018-08-18 DIAGNOSIS — E876 Hypokalemia: Secondary | ICD-10-CM | POA: Diagnosis present

## 2018-08-18 DIAGNOSIS — B9689 Other specified bacterial agents as the cause of diseases classified elsewhere: Secondary | ICD-10-CM | POA: Diagnosis present

## 2018-08-18 DIAGNOSIS — Z888 Allergy status to other drugs, medicaments and biological substances status: Secondary | ICD-10-CM | POA: Diagnosis not present

## 2018-08-18 DIAGNOSIS — Z8249 Family history of ischemic heart disease and other diseases of the circulatory system: Secondary | ICD-10-CM | POA: Diagnosis not present

## 2018-08-18 DIAGNOSIS — A419 Sepsis, unspecified organism: Principal | ICD-10-CM | POA: Diagnosis present

## 2018-08-18 DIAGNOSIS — N132 Hydronephrosis with renal and ureteral calculous obstruction: Secondary | ICD-10-CM

## 2018-08-18 DIAGNOSIS — E669 Obesity, unspecified: Secondary | ICD-10-CM | POA: Diagnosis present

## 2018-08-18 DIAGNOSIS — Z91013 Allergy to seafood: Secondary | ICD-10-CM

## 2018-08-18 DIAGNOSIS — J9811 Atelectasis: Secondary | ICD-10-CM | POA: Diagnosis not present

## 2018-08-18 DIAGNOSIS — R509 Fever, unspecified: Secondary | ICD-10-CM | POA: Diagnosis not present

## 2018-08-18 DIAGNOSIS — N179 Acute kidney failure, unspecified: Secondary | ICD-10-CM | POA: Diagnosis not present

## 2018-08-18 DIAGNOSIS — N5234 Erectile dysfunction following simple prostatectomy: Secondary | ICD-10-CM | POA: Diagnosis not present

## 2018-08-18 DIAGNOSIS — Z7984 Long term (current) use of oral hypoglycemic drugs: Secondary | ICD-10-CM | POA: Diagnosis not present

## 2018-08-18 DIAGNOSIS — Z8619 Personal history of other infectious and parasitic diseases: Secondary | ICD-10-CM | POA: Insufficient documentation

## 2018-08-18 DIAGNOSIS — D649 Anemia, unspecified: Secondary | ICD-10-CM | POA: Diagnosis present

## 2018-08-18 DIAGNOSIS — Z9079 Acquired absence of other genital organ(s): Secondary | ICD-10-CM | POA: Diagnosis not present

## 2018-08-18 DIAGNOSIS — N139 Obstructive and reflux uropathy, unspecified: Secondary | ICD-10-CM | POA: Diagnosis not present

## 2018-08-18 DIAGNOSIS — N390 Urinary tract infection, site not specified: Secondary | ICD-10-CM

## 2018-08-18 DIAGNOSIS — I10 Essential (primary) hypertension: Secondary | ICD-10-CM | POA: Diagnosis present

## 2018-08-18 DIAGNOSIS — Z8546 Personal history of malignant neoplasm of prostate: Secondary | ICD-10-CM | POA: Diagnosis not present

## 2018-08-18 DIAGNOSIS — E1159 Type 2 diabetes mellitus with other circulatory complications: Secondary | ICD-10-CM | POA: Diagnosis present

## 2018-08-18 DIAGNOSIS — I152 Hypertension secondary to endocrine disorders: Secondary | ICD-10-CM | POA: Diagnosis present

## 2018-08-18 DIAGNOSIS — Z885 Allergy status to narcotic agent status: Secondary | ICD-10-CM

## 2018-08-18 DIAGNOSIS — N1330 Unspecified hydronephrosis: Secondary | ICD-10-CM | POA: Diagnosis not present

## 2018-08-18 DIAGNOSIS — IMO0002 Reserved for concepts with insufficient information to code with codable children: Secondary | ICD-10-CM

## 2018-08-18 DIAGNOSIS — N201 Calculus of ureter: Secondary | ICD-10-CM | POA: Diagnosis present

## 2018-08-18 DIAGNOSIS — Z79899 Other long term (current) drug therapy: Secondary | ICD-10-CM

## 2018-08-18 DIAGNOSIS — R109 Unspecified abdominal pain: Secondary | ICD-10-CM | POA: Diagnosis not present

## 2018-08-18 DIAGNOSIS — R6520 Severe sepsis without septic shock: Secondary | ICD-10-CM | POA: Diagnosis not present

## 2018-08-18 HISTORY — DX: Personal history of urinary calculi: Z87.442

## 2018-08-18 HISTORY — DX: Unspecified hydronephrosis: N13.30

## 2018-08-18 HISTORY — PX: CYSTOSCOPY/URETEROSCOPY/HOLMIUM LASER/STENT PLACEMENT: SHX6546

## 2018-08-18 LAB — BASIC METABOLIC PANEL
Anion gap: 12 (ref 5–15)
BUN: 14 mg/dL (ref 8–23)
CHLORIDE: 107 mmol/L (ref 98–111)
CO2: 20 mmol/L — AB (ref 22–32)
Calcium: 10.5 mg/dL — ABNORMAL HIGH (ref 8.9–10.3)
Creatinine, Ser: 1.8 mg/dL — ABNORMAL HIGH (ref 0.61–1.24)
GFR calc Af Amer: 46 mL/min — ABNORMAL LOW (ref >60)
GFR calc non Af Amer: 40 mL/min — ABNORMAL LOW (ref >60)
Glucose, Bld: 154 mg/dL — ABNORMAL HIGH (ref 70–99)
Potassium: 3.2 mmol/L — ABNORMAL LOW (ref 3.5–5.1)
Sodium: 139 mmol/L (ref 135–145)

## 2018-08-18 LAB — URINALYSIS, ROUTINE W REFLEX MICROSCOPIC
Bilirubin Urine: NEGATIVE
Glucose, UA: 50 mg/dL — AB
Ketones, ur: 5 mg/dL — AB
Nitrite: NEGATIVE
Protein, ur: NEGATIVE mg/dL
SPECIFIC GRAVITY, URINE: 1.013 (ref 1.005–1.030)
WBC, UA: >50 WBC/hpf — ABNORMAL HIGH (ref 0–5)
pH: 6 (ref 5.0–8.0)

## 2018-08-18 LAB — CBC
HCT: 37.3 % — ABNORMAL LOW (ref 39.0–52.0)
Hemoglobin: 12.5 g/dL — ABNORMAL LOW (ref 13.0–17.0)
MCH: 30.3 pg (ref 26.0–34.0)
MCHC: 33.5 g/dL (ref 30.0–36.0)
MCV: 90.3 fL (ref 80.0–100.0)
Platelets: 441 10*3/uL — ABNORMAL HIGH (ref 150–400)
RBC: 4.13 MIL/uL — ABNORMAL LOW (ref 4.22–5.81)
RDW: 13.2 % (ref 11.5–15.5)
WBC: 17.9 10*3/uL — ABNORMAL HIGH (ref 4.0–10.5)
nRBC: 0 % (ref 0.0–0.2)

## 2018-08-18 LAB — HEPATIC FUNCTION PANEL
ALBUMIN: 3.7 g/dL (ref 3.5–5.0)
ALT: 20 U/L (ref 0–44)
AST: 18 U/L (ref 15–41)
Alkaline Phosphatase: 59 U/L (ref 38–126)
Bilirubin, Direct: 0.2 mg/dL (ref 0.0–0.2)
Indirect Bilirubin: 1.3 mg/dL — ABNORMAL HIGH (ref 0.3–0.9)
Total Bilirubin: 1.5 mg/dL — ABNORMAL HIGH (ref 0.3–1.2)
Total Protein: 7.3 g/dL (ref 6.5–8.1)

## 2018-08-18 LAB — LACTIC ACID, PLASMA
Lactic Acid, Venous: 3 mmol/L (ref 0.5–1.9)
Lactic Acid, Venous: 2 mmol/L (ref 0.5–1.9)

## 2018-08-18 LAB — GLUCOSE, CAPILLARY: Glucose-Capillary: 103 mg/dL — ABNORMAL HIGH (ref 70–99)

## 2018-08-18 LAB — LIPASE, BLOOD: Lipase: 38 U/L (ref 11–51)

## 2018-08-18 LAB — CBG MONITORING, ED: Glucose-Capillary: 92 mg/dL (ref 70–99)

## 2018-08-18 SURGERY — CYSTOSCOPY/URETEROSCOPY/HOLMIUM LASER/STENT PLACEMENT
Anesthesia: General | Site: Urethra | Laterality: Left

## 2018-08-18 MED ORDER — ACETAMINOPHEN 650 MG RE SUPP: 650.0000 mg | Freq: Four times a day (QID) | RECTAL | Status: DC | PRN

## 2018-08-18 MED ORDER — PHENYLEPHRINE 40 MCG/ML (10ML) SYRINGE FOR IV PUSH (FOR BLOOD PRESSURE SUPPORT)
PREFILLED_SYRINGE | INTRAVENOUS | Status: AC
  Filled 2018-08-18: qty 10

## 2018-08-18 MED ORDER — MIDAZOLAM HCL 2 MG/2ML IJ SOLN
INTRAMUSCULAR | Status: AC
  Filled 2018-08-18: qty 2

## 2018-08-18 MED ORDER — HYDROMORPHONE HCL 1 MG/ML IJ SOLN
0.5000 mg | Freq: Once | INTRAMUSCULAR | Status: AC
  Administered 2018-08-18: 0.5 mg via INTRAVENOUS
  Filled 2018-08-18: qty 1

## 2018-08-18 MED ORDER — FENTANYL CITRATE (PF) 100 MCG/2ML IJ SOLN
INTRAMUSCULAR | Status: DC | PRN
  Administered 2018-08-18: 100 ug via INTRAVENOUS

## 2018-08-18 MED ORDER — SODIUM CHLORIDE 0.9 % IR SOLN
Status: DC | PRN
  Administered 2018-08-18: 6000 mL

## 2018-08-18 MED ORDER — METRONIDAZOLE IN NACL 5-0.79 MG/ML-% IV SOLN
500.0000 mg | Freq: Three times a day (TID) | INTRAVENOUS | Status: DC
  Administered 2018-08-18: 500 mg via INTRAVENOUS
  Filled 2018-08-18 (×2): qty 100

## 2018-08-18 MED ORDER — VANCOMYCIN HCL IN DEXTROSE 1-5 GM/200ML-% IV SOLN: 1000.0000 mg | Freq: Once | INTRAVENOUS | Status: DC

## 2018-08-18 MED ORDER — LACTATED RINGERS IV SOLN
INTRAVENOUS | Status: DC
  Administered 2018-08-18 (×3): via INTRAVENOUS

## 2018-08-18 MED ORDER — SODIUM CHLORIDE 0.9 % IV SOLN
INTRAVENOUS | Status: DC
  Administered 2018-08-18 – 2018-08-20 (×3): via INTRAVENOUS

## 2018-08-18 MED ORDER — ONDANSETRON HCL 4 MG PO TABS: 4.0000 mg | ORAL_TABLET | Freq: Four times a day (QID) | ORAL | Status: DC | PRN

## 2018-08-18 MED ORDER — EPHEDRINE SULFATE-NACL 50-0.9 MG/10ML-% IV SOSY
PREFILLED_SYRINGE | INTRAVENOUS | Status: DC | PRN
  Administered 2018-08-18: 5 mg via INTRAVENOUS

## 2018-08-18 MED ORDER — ONDANSETRON HCL 4 MG/2ML IJ SOLN
INTRAMUSCULAR | Status: DC | PRN
  Administered 2018-08-18: 4 mg via INTRAVENOUS

## 2018-08-18 MED ORDER — VANCOMYCIN HCL 10 G IV SOLR
1500.0000 mg | INTRAVENOUS | Status: DC
  Filled 2018-08-18: qty 1500

## 2018-08-18 MED ORDER — SODIUM CHLORIDE 0.9 % IV SOLN
2.0000 g | Freq: Two times a day (BID) | INTRAVENOUS | Status: DC
  Filled 2018-08-18: qty 2

## 2018-08-18 MED ORDER — ONDANSETRON HCL 4 MG/2ML IJ SOLN
4.0000 mg | Freq: Once | INTRAMUSCULAR | Status: AC
  Administered 2018-08-18: 4 mg via INTRAVENOUS
  Filled 2018-08-18: qty 2

## 2018-08-18 MED ORDER — LIDOCAINE 2% (20 MG/ML) 5 ML SYRINGE
INTRAMUSCULAR | Status: DC | PRN
  Administered 2018-08-18: 100 mg via INTRAVENOUS

## 2018-08-18 MED ORDER — SODIUM CHLORIDE 0.9 % IV SOLN
2.0000 g | Freq: Once | INTRAVENOUS | Status: AC
  Administered 2018-08-18: 2 g via INTRAVENOUS
  Filled 2018-08-18: qty 2

## 2018-08-18 MED ORDER — HYDROMORPHONE HCL 1 MG/ML IJ SOLN: 0.2500 mg | INTRAMUSCULAR | Status: DC | PRN

## 2018-08-18 MED ORDER — PROPOFOL 10 MG/ML IV BOLUS
INTRAVENOUS | Status: DC | PRN
  Administered 2018-08-18: 200 mg via INTRAVENOUS

## 2018-08-18 MED ORDER — SODIUM CHLORIDE 0.9 % IV BOLUS (SEPSIS)
1000.0000 mL | Freq: Once | INTRAVENOUS | Status: AC
  Administered 2018-08-18: 1000 mL via INTRAVENOUS

## 2018-08-18 MED ORDER — SODIUM CHLORIDE 0.9 % IV SOLN
INTRAVENOUS | Status: DC | PRN
  Administered 2018-08-18: 8 mL

## 2018-08-18 MED ORDER — VANCOMYCIN HCL 10 G IV SOLR
2000.0000 mg | Freq: Once | INTRAVENOUS | Status: AC
  Administered 2018-08-18: 2000 mg via INTRAVENOUS
  Filled 2018-08-18: qty 2000

## 2018-08-18 MED ORDER — ONDANSETRON HCL 4 MG/2ML IJ SOLN: 4.0000 mg | Freq: Four times a day (QID) | INTRAMUSCULAR | Status: DC | PRN

## 2018-08-18 MED ORDER — MIDAZOLAM HCL 5 MG/5ML IJ SOLN
INTRAMUSCULAR | Status: DC | PRN
  Administered 2018-08-18: 2 mg via INTRAVENOUS

## 2018-08-18 MED ORDER — MEPERIDINE HCL 50 MG/ML IJ SOLN: 6.2500 mg | INTRAMUSCULAR | Status: DC | PRN

## 2018-08-18 MED ORDER — INSULIN ASPART 100 UNIT/ML ~~LOC~~ SOLN
0.0000 [IU] | SUBCUTANEOUS | Status: DC
  Administered 2018-08-19: 1 [IU] via SUBCUTANEOUS

## 2018-08-18 MED ORDER — PHENYLEPHRINE 40 MCG/ML (10ML) SYRINGE FOR IV PUSH (FOR BLOOD PRESSURE SUPPORT)
PREFILLED_SYRINGE | INTRAVENOUS | Status: DC | PRN
  Administered 2018-08-18 (×4): 80 ug via INTRAVENOUS

## 2018-08-18 MED ORDER — SUCCINYLCHOLINE CHLORIDE 200 MG/10ML IV SOSY
PREFILLED_SYRINGE | INTRAVENOUS | Status: DC | PRN
  Administered 2018-08-18: 120 mg via INTRAVENOUS

## 2018-08-18 MED ORDER — PROPOFOL 10 MG/ML IV BOLUS
INTRAVENOUS | Status: AC
  Filled 2018-08-18: qty 20

## 2018-08-18 MED ORDER — LIDOCAINE 2% (20 MG/ML) 5 ML SYRINGE
INTRAMUSCULAR | Status: AC
  Filled 2018-08-18: qty 5

## 2018-08-18 MED ORDER — ONDANSETRON HCL 4 MG/2ML IJ SOLN: 4.0000 mg | Freq: Once | INTRAMUSCULAR | Status: DC | PRN

## 2018-08-18 MED ORDER — FENTANYL CITRATE (PF) 100 MCG/2ML IJ SOLN
INTRAMUSCULAR | Status: AC
  Filled 2018-08-18: qty 2

## 2018-08-18 MED ORDER — ONDANSETRON HCL 4 MG/2ML IJ SOLN
INTRAMUSCULAR | Status: AC
  Filled 2018-08-18: qty 2

## 2018-08-18 MED ORDER — ACETAMINOPHEN 500 MG PO TABS
1000.0000 mg | ORAL_TABLET | Freq: Once | ORAL | Status: AC
  Administered 2018-08-18: 1000 mg via ORAL
  Filled 2018-08-18: qty 2

## 2018-08-18 MED ORDER — MORPHINE SULFATE (PF) 4 MG/ML IV SOLN
4.0000 mg | Freq: Once | INTRAVENOUS | Status: AC
  Administered 2018-08-18: 4 mg via INTRAVENOUS
  Filled 2018-08-18: qty 1

## 2018-08-18 MED ORDER — ACETAMINOPHEN 325 MG PO TABS: 650.0000 mg | ORAL_TABLET | Freq: Four times a day (QID) | ORAL | Status: DC | PRN

## 2018-08-18 MED ORDER — FENTANYL CITRATE (PF) 100 MCG/2ML IJ SOLN
50.0000 ug | Freq: Once | INTRAMUSCULAR | Status: AC
  Administered 2018-08-18: 50 ug via INTRAVENOUS
  Filled 2018-08-18: qty 2

## 2018-08-18 SURGICAL SUPPLY — 21 items
BAG URINE DRAINAGE (UROLOGICAL SUPPLIES) ×2 IMPLANT
BAG URO CATCHER STRL LF (MISCELLANEOUS) ×2 IMPLANT
BASKET ZERO TIP NITINOL 2.4FR (BASKET) IMPLANT
CATH FOLEY 2WAY SLVR  5CC 18FR (CATHETERS) ×1
CATH FOLEY 2WAY SLVR 5CC 18FR (CATHETERS) ×1 IMPLANT
CATH URET 5FR 28IN OPEN ENDED (CATHETERS) ×2 IMPLANT
CLOTH BEACON ORANGE TIMEOUT ST (SAFETY) ×2 IMPLANT
COVER WAND RF STERILE (DRAPES) IMPLANT
EXTRACTOR STONE NITINOL NGAGE (UROLOGICAL SUPPLIES) IMPLANT
FIBER LASER FLEXIVA 365 (UROLOGICAL SUPPLIES) IMPLANT
FIBER LASER TRAC TIP (UROLOGICAL SUPPLIES) IMPLANT
GLOVE BIOGEL M STRL SZ7.5 (GLOVE) ×2 IMPLANT
GOWN STRL REUS W/TWL XL LVL3 (GOWN DISPOSABLE) ×2 IMPLANT
GUIDEWIRE STR DUAL SENSOR (WIRE) IMPLANT
GUIDEWIRE ZIPWRE .038 STRAIGHT (WIRE) ×2 IMPLANT
MANIFOLD NEPTUNE II (INSTRUMENTS) ×2 IMPLANT
PACK CYSTO (CUSTOM PROCEDURE TRAY) ×2 IMPLANT
SHEATH URETERAL 12FRX35CM (MISCELLANEOUS) IMPLANT
STENT URET 6FRX26 CONTOUR (STENTS) ×2 IMPLANT
TUBING CONNECTING 10 (TUBING) ×2 IMPLANT
TUBING UROLOGY SET (TUBING) ×2 IMPLANT

## 2018-08-18 NOTE — ED Notes (Signed)
Called [facility] for pt transport  

## 2018-08-18 NOTE — H&P (Signed)
History and Physical    Derrick Walker JKK:[id number] DOB: 28-Jan-1956 DOA: 08/18/2018  PCP: Binnie Rail, MD  Patient coming from: home   I have personally briefly reviewed patient's old medical records available.   Chief Complaint: Left flank pain and fever.  HPI: Derrick Walker is a 63 y.o. male with medical history significant of type 2 diabetes on metformin, history of penile prosthesis and complications that was treated, hypertension who presents to the hospital with acute severe onset of left-sided flank pain nausea and fever at home.  He is started having left flank pain, severe, 10 out of 10, severe dull ache, associated with nausea and one episode of vomiting.  He had temperature but did not measure at home.  His urine was looking dark since last few days.  His bowel movements are normal.  He had some chills today morning.  No radiation of the pain. ED Course: Blood pressures are stable and slightly hypertensive.  Temperature is one 1.2.  Lactate is 3.  WBC count 17.9.  Chest x-ray is normal.  CT scan of the abdomen and pelvis shows left UVJ stone  7 mm with moderate hydronephrosis and hydroureter.  Patient will be started on sepsis protocol.  He was treated with 3 L of normal saline.  Blood cultures and urine cultures were drawn.  Patient was treated with vancomycin, cefepime and Flagyl. Case was discussed with neurology who is scheduled patient for cystoscopy and stenting at [facility].  Patient will be stabilized and transferred for admission to [facility].   Review of Systems: As per HPI otherwise 10 point review of systems negative.    Past Medical History:  Diagnosis Date  . Erectile dysfunction   . History of prostate cancer urologist-  dr Orpah Melter   dx 05/ 2015 -- moderate risk Stage T2c, Gleason 3+4,  vol 152.41cc---  09/ 2015 s/p  radical prostatectomy -- per pt last PSA 07-15-2016  undectable  . Hypertension, essential   . Right inguinal hernia      from penile prosthesis reservior post surgery 11-19-2016  . Right inguinal pain   . Type 2 diabetes mellitus ([place])     Past Surgical History:  Procedure Laterality Date  . INGUINAL HERNIA REPAIR Right 01/14/2017   Procedure: HERNIA REPAIR INGUINAL ADULT;  Surgeon: Cleon Gustin, MD;  Location: [facility];  Service: Urology;  Laterality: Right;  . LYMPHADENECTOMY Bilateral 04/09/2014   Procedure: BILATERAL PELVIC LYMPH NODE DISSECTION;  Surgeon: Alexis Frock, MD;  Location: [facility];  Service: Urology;  Laterality: Bilateral;  . PENILE PROSTHESIS IMPLANT N/A 11/19/2016   Procedure: AMS PENILE PROTHESIS INFLATABLE;  Surgeon: Cleon Gustin, MD;  Location: [facility];  Service: Urology;  Laterality: N/A;  . PENILE PROSTHESIS IMPLANT Right 01/14/2017   Procedure: REVISION PENILE PROSTHESIS;  Surgeon: Cleon Gustin, MD;  Location: [facility];  Service: Urology;  Laterality: Right;  . PENILE PROSTHESIS IMPLANT N/A 02/21/2017   Procedure: PENILE PROSTHESIS REVISION;  Surgeon: Cleon Gustin, MD;  [facility];  Service: Urology;  Laterality: N/A;  . PROSTATE BIOPSY N/A 12/11/2013   Procedure: BIOPSY TRANSRECTAL ULTRASONIC PROSTATE (TUBP);  Surgeon: Arvil Persons, MD;  Location: [facility];  Service: Urology;  Laterality: N/A;  . ROBOT ASSISTED LAPAROSCOPIC RADICAL PROSTATECTOMY N/A 04/09/2014   Procedure: ROBOTIC ASSISTED LAPAROSCOPIC RADICAL PROSTATECTOMY, INDOCYANINE GREEN DYE ;  Surgeon: Alexis Frock, MD;  Location: [facility];  Service: Urology;  Laterality: N/A;  . TRANSTHORACIC ECHOCARDIOGRAM  10-14-2013   dr Shanon Brow harding   mild LVH/  ef  47-09%/  grade I diastolic dysfunction/  mild LAE     reports that he has never smoked. He has never used smokeless tobacco. He reports that he does not drink alcohol or use drugs.  Allergies  Allergen Reactions  . Shellfish Allergy Anaphylaxis  .  Oxycodone Hives    Able to take percocet without problems  . Lisinopril Other (See Comments)    Pt states gives him spasm in chest  . Losartan Other (See Comments)    Kidney pain, leg swelling    Family History  Problem Relation Age of Onset  . Hypertension Mother   . Hypertension Maternal Grandmother   . Hypertension Paternal Grandmother   . Kidney failure Father   . Hypertension Brother   . Colon cancer Neg Hx   . Colon polyps Neg Hx   . Esophageal cancer Neg Hx   . Rectal cancer Neg Hx   . Stomach cancer Neg Hx      Prior to Admission medications   Not on File    Physical Exam: Vitals:   08/18/18 0957 08/18/18 1011 08/18/18 1030 08/18/18 1100  BP:   117/82 (!) 144/75  Pulse:  (!) 106 (!) 107 (!) 114  Resp:  17 20 (!) 24  Temp: (!) 101.2 F (38.4 C)     TempSrc: Oral     SpO2:  96% 94% 93%  Weight:      Height:        Constitutional: NAD, calm, comfortable Vitals:   08/18/18 0957 08/18/18 1011 08/18/18 1030 08/18/18 1100  BP:   117/82 (!) 144/75  Pulse:  (!) 106 (!) 107 (!) 114  Resp:  17 20 (!) 24  Temp: (!) 101.2 F (38.4 C)     TempSrc: Oral     SpO2:  96% 94% 93%  Weight:      Height:       Eyes: PERRL, lids and conjunctivae normal ENMT: Mucous membranes are moist. Posterior pharynx clear of any exudate or lesions.Normal dentition.  Neck: normal, supple, no masses, no thyromegaly Respiratory: clear to auscultation bilaterally, no wheezing, no crackles. Normal respiratory effort. No accessory muscle use.  Cardiovascular: Regular rate and rhythm, no murmurs / rubs / gallops. No extremity edema. 2+ pedal pulses. No carotid bruits.  Tachycardic. Abdomen: no tenderness, no masses palpated. No hepatosplenomegaly. Bowel sounds positive.  No rigidity or guarding. Patient has a prosthetic reservoir on the left groin and also prosthesis on his scrotum that are nontender to palpation. Musculoskeletal: no clubbing / cyanosis. No joint deformity upper and lower  extremities. Good ROM, no contractures. Normal muscle tone.  Skin: no rashes, lesions, ulcers. No induration Neurologic: CN 2-12 grossly intact. Sensation intact, DTR normal. Strength 5/5 in all 4.  Psychiatric: Normal judgment and insight. Alert and oriented x 3. Normal mood.     Labs on Admission: I have personally reviewed following labs and imaging studies  CBC: Recent Labs  Lab 08/18/18 0857  WBC 17.9*  HGB 12.5*  HCT 37.3*  MCV 90.3  PLT 628*   Basic Metabolic Panel: Recent Labs  Lab 08/18/18 0857  NA 139  K 3.2*  CL 107  CO2 20*  GLUCOSE 154*  BUN 14  CREATININE 1.80*  CALCIUM 10.5*   GFR: Estimated Creatinine Clearance: 50.5 mL/min (A) (by C-G formula based on SCr of 1.8  mg/dL (H)). Liver Function Tests: Recent Labs  Lab 08/18/18 0857  AST 18  ALT 20  ALKPHOS 59  BILITOT 1.5*  PROT 7.3  ALBUMIN 3.7   Recent Labs  Lab 08/18/18 0857  LIPASE 38   No results for input(s): AMMONIA in the last 168 hours. Coagulation Profile: No results for input(s): INR, PROTIME in the last 168 hours. Cardiac Enzymes: No results for input(s): CKTOTAL, CKMB, CKMBINDEX, TROPONINI in the last 168 hours. BNP (last 3 results) No results for input(s): PROBNP in the last 8760 hours. HbA1C: No results for input(s): HGBA1C in the last 72 hours. CBG: No results for input(s): GLUCAP in the last 168 hours. Lipid Profile: No results for input(s): CHOL, HDL, LDLCALC, TRIG, CHOLHDL, LDLDIRECT in the last 72 hours. Thyroid Function Tests: No results for input(s): TSH, T4TOTAL, FREET4, T3FREE, THYROIDAB in the last 72 hours. Anemia Panel: No results for input(s): VITAMINB12, FOLATE, FERRITIN, TIBC, IRON, RETICCTPCT in the last 72 hours. Urine analysis:    Component Value Date/Time   COLORURINE YELLOW 08/18/2018 0857   APPEARANCEUR HAZY (A) 08/18/2018 0857   LABSPEC 1.013 08/18/2018 0857   PHURINE 6.0 08/18/2018 0857   GLUCOSEU 50 (A) 08/18/2018 0857   HGBUR SMALL (A)  08/18/2018 0857   BILIRUBINUR NEGATIVE 08/18/2018 0857   KETONESUR 5 (A) 08/18/2018 0857   PROTEINUR NEGATIVE 08/18/2018 0857   UROBILINOGEN 0.2 03/29/2011 2117   NITRITE NEGATIVE 08/18/2018 0857   LEUKOCYTESUR MODERATE (A) 08/18/2018 0857    Radiological Exams on Admission: Dg Chest Port 1 View  Result Date: 08/18/2018 CLINICAL DATA:  Sepsis. EXAM: PORTABLE CHEST 1 VIEW COMPARISON:  04/07/2014 FINDINGS: The cardiac silhouette is accentuated by low lung volumes and portable AP technique. There is mild elevation of the left hemidiaphragm with mild, heterogeneous left basilar opacity. The right lung is clear. No sizable pleural effusion or pneumothorax is identified. No acute osseous abnormality is seen. IMPRESSION: Mild left basilar atelectasis. Electronically Signed   By: Logan Bores M.D.   On: 08/18/2018 11:34   Ct Renal Stone Study  Result Date: 08/18/2018 CLINICAL DATA:  Left-sided flank pain for 2 days EXAM: CT ABDOMEN AND PELVIS WITHOUT CONTRAST TECHNIQUE: Multidetector CT imaging of the abdomen and pelvis was performed following the standard protocol without IV contrast. COMPARISON:  01/03/2017 FINDINGS: Lower chest: No acute abnormality. Hepatobiliary: No focal liver abnormality is seen. No gallstones, gallbladder wall thickening, or biliary dilatation. Pancreas: Unremarkable. No pancreatic ductal dilatation or surrounding inflammatory changes. Spleen: Normal in size without focal abnormality. Adrenals/Urinary Tract: Adrenal glands are within normal limits bilaterally. Kidneys are well visualized bilaterally. The right kidney demonstrates no renal calculi or obstructive changes. The left kidney is enlarged with evidence of hydronephrosis and perinephric stranding. This hydronephrosis extends inferiorly to the level of the left ureterovesical junction. There is a 7-8 mm ovoid stone identified at the left UVJ. The bladder is partially distended. Stomach/Bowel: The appendix is within normal  limits. No obstructive or inflammatory changes of the larger small-bowel are seen. The stomach is within normal limits. Vascular/Lymphatic: Aortic atherosclerosis. No enlarged abdominal or pelvic lymph nodes. Reproductive: Prostate has been surgically removed. Penile prosthesis is noted in place. Other: No abdominal wall hernia or abnormality. No abdominopelvic ascites. Musculoskeletal: Mild degenerative changes of lumbar spine are noted. IMPRESSION: 7-8 mm left UVJ stone with left hydronephrosis and hydroureter. No other focal abnormality is noted. Electronically Signed   By: Inez Catalina M.D.   On: 08/18/2018 10:12    EKG: Independently reviewed.  Sinus tachycardia otherwise no other abnormality.  Assessment/Plan Principal Problem:   Sepsis secondary to UTI [facility]) Active Problems:   Hypertension, essential   History of prostate cancer   Diabetes ([place])   Sepsis due to gram-negative UTI ([place])   Sepsis secondary to UTI with obstructive uropathy: Patient currently hemodynamically stable.  Resuscitated with bolus fluids 30 mL/kg.  Patient has received vancomycin, cefepime and Flagyl.  We will continue vancomycin and cefepime and discontinue Flagyl.  Continue to trend lactate until normalizes. As per urology recommendation, patient will be transferred to [facility] for urological procedure. He does have prosthesis and they are nontender.  Hypertension: He is slightly hypertensive.  He has risk of developing hypotension.  Resuscitating with IV fluids.  We will hold off on his antihypertensives and monitor at this time.  Type 2 diabetes: Fairly controlled.  On metformin.  Hold.  Will keep on sliding scale insulin, Accu-Cheks every 4 hours.  DVT prophylaxis: SCDs. Code Status: Full code. Family Communication: No family at the bedside. Disposition Plan: Home in 2 to 3 days. Consults called: Urology. Admission status: Inpatient to stepdown.   Barb Merino MD [facility] Pager [phone]  If 7PM-7AM, please contact night-coverage [email or web address] Password [facility]  08/18/2018, 12:38 PM

## 2018-08-18 NOTE — Anesthesia Procedure Notes (Signed)
Procedure Name: Intubation Date/Time: 08/18/2018 7:54 PM Performed by: Montel Clock, CRNA Pre-anesthesia Checklist: Patient identified, Emergency Drugs available, Suction available, Patient being monitored and Timeout performed Patient Re-evaluated:Patient Re-evaluated prior to induction Oxygen Delivery Method: Circle system utilized Preoxygenation: Pre-oxygenation with 100% oxygen Induction Type: IV induction and Rapid sequence Laryngoscope Size: Mac and 3 Grade View: Grade II Tube type: Oral Tube size: 7.5 mm Number of attempts: 1 Airway Equipment and Method: Stylet Placement Confirmation: ETT inserted through vocal cords under direct vision,  positive ETCO2 and breath sounds checked- equal and bilateral Secured at: 22 cm Tube secured with: Tape Dental Injury: Teeth and Oropharynx as per pre-operative assessment  Comments: Grade 2b view

## 2018-08-18 NOTE — ED Provider Notes (Signed)
[facility] EMERGENCY DEPARTMENT Provider Note   CSN: [id number] Arrival date & time: 08/18/18  7989     History   Chief Complaint Chief Complaint  Patient presents with  . Flank Pain    HPI Derrick Walker is a 63 y.o. male  Flankpain,  Onset last night around 7 PM.  Patient complains of pain in his left flank and left lower abdomen.  He states that the pain is constant, colicky, severe.  He has had multiple episodes of nausea and vomiting.  He denies any urinary symptoms or history of kidney stones.  The patient does have a history of penile prosthesis with reservoir  in the left lower abdominal abdomen.  The patient    HPI  Past Medical History:  Diagnosis Date  . Erectile dysfunction   . History of prostate cancer urologist-  dr Orpah Melter   dx 05/ 2015 -- moderate risk Stage T2c, Gleason 3+4,  vol 152.41cc---  09/ 2015 s/p  radical prostatectomy -- per pt last PSA 07-15-2016  undectable  . Hypertension, essential   . Right inguinal hernia    from penile prosthesis reservior post surgery 11-19-2016  . Right inguinal pain   . Type 2 diabetes mellitus [facility])     Patient Active Problem List   Diagnosis Date Noted  . Erectile dysfunction 11/19/2016  . Diabetes ([place]) 12/15/2015  . History of prostate cancer 04/09/2014  . Abnormal finding on EKG 09/17/2013  . LVH (left ventricular hypertrophy) - an EKG 09/17/2013  . Hypertension, essential   . Mild obesity     Past Surgical History:  Procedure Laterality Date  . INGUINAL HERNIA REPAIR Right 01/14/2017   Procedure: HERNIA REPAIR INGUINAL ADULT;  Surgeon: Cleon Gustin, MD;  Location: [facility];  Service: Urology;  Laterality: Right;  . LYMPHADENECTOMY Bilateral 04/09/2014   Procedure: BILATERAL PELVIC LYMPH NODE DISSECTION;  Surgeon: Alexis Frock, MD;  Location: [facility];  Service: Urology;  Laterality: Bilateral;  . PENILE PROSTHESIS IMPLANT N/A 11/19/2016   Procedure:  AMS PENILE PROTHESIS INFLATABLE;  Surgeon: Cleon Gustin, MD;  Location: [facility];  Service: Urology;  Laterality: N/A;  . PENILE PROSTHESIS IMPLANT Right 01/14/2017   Procedure: REVISION PENILE PROSTHESIS;  Surgeon: Cleon Gustin, MD;  Location: [facility];  Service: Urology;  Laterality: Right;  . PENILE PROSTHESIS IMPLANT N/A 02/21/2017   Procedure: PENILE PROSTHESIS REVISION;  Surgeon: Cleon Gustin, MD;  Location: [facility];  Service: Urology;  Laterality: N/A;  . PROSTATE BIOPSY N/A 12/11/2013   Procedure: BIOPSY TRANSRECTAL ULTRASONIC PROSTATE (TUBP);  Surgeon: Arvil Persons, MD;  Location: [facility];  Service: Urology;  Laterality: N/A;  . ROBOT ASSISTED LAPAROSCOPIC RADICAL PROSTATECTOMY N/A 04/09/2014   Procedure: ROBOTIC ASSISTED LAPAROSCOPIC RADICAL PROSTATECTOMY, INDOCYANINE GREEN DYE ;  Surgeon: Alexis Frock, MD;  Location[facility];  Service: Urology;  Laterality: N/A;  . TRANSTHORACIC ECHOCARDIOGRAM  10-14-2013   dr Shanon Brow harding   mild LVH/  ef  21-19%/  grade I diastolic dysfunction/  mild LAE        Home Medications    Prior to Admission medications   Medication Sig Start Date End Date Taking? Authorizing Provider  amLODipine (NORVASC) 5 MG tablet Take 1 tablet (5 mg total) by mouth daily. 01/22/18   Binnie Rail, MD  EPINEPHrine 0.3 mg/0.3 mL IJ SOAJ injection Inject 0.3 mg into the muscle as needed for anaphylaxis.  [provider]  FARXIGA 10 MG TABS tablet TAKE 1 TABLET BY MOUTH ONCE DAILY Patient taking differently: Take 10 mg by mouth daily.  03/17/18   Binnie Rail, MD  hydrochlorothiazide (HYDRODIURIL) 25 MG tablet TAKE 1 TABLET BY MOUTH ONCE DAILY Patient taking differently: Take 25 mg by mouth daily.  05/01/18   Binnie Rail, MD  metFORMIN (GLUCOPHAGE) 1000 MG tablet TAKE 1 TABLET BY MOUTH TWICE DAILY WITH A MEAL Patient taking differently: Take 1,000 mg by mouth 2  (two) times daily with a meal.  03/17/18   Burns, Claudina Lick, MD  [facility] LANCETS 55[facility] [place]  10/22/17   [provider]    Family History Family History  Problem Relation Age of Onset  . Hypertension Mother   . Hypertension Maternal Grandmother   . Hypertension Paternal Grandmother   . Kidney failure Father   . Hypertension Brother   . Colon cancer Neg Hx   . Colon polyps Neg Hx   . Esophageal cancer Neg Hx   . Rectal cancer Neg Hx   . Stomach cancer Neg Hx     Social History Social History   Tobacco Use  . Smoking status: Never Smoker  . Smokeless tobacco: Never Used  Substance Use Topics  . Alcohol use: No    Alcohol/week: 7.0 standard drinks    Types: 7 Standard drinks or equivalent per week  . Drug use: No     Allergies   Shellfish allergy; Oxycodone; Lisinopril; and Losartan   Review of Systems Review of Systems Ten systems reviewed and are negative for acute change, except as noted in the HPI.    Physical Exam Updated Vital Signs BP (!) 173/77 (BP Location: Left Arm)   Pulse 83   Temp 98 F (36.7 C) (Oral)   Ht 5\' 11"  (1.803 m)   Wt 94.3 kg   SpO2 100%   BMI 29.01 kg/m   Physical Exam Vitals signs and nursing note reviewed.  Constitutional:      General: He is not in acute distress.    Appearance: He is well-developed. He is ill-appearing and toxic-appearing. He is not diaphoretic.     Comments: Active rigors  HENT:     Head: Normocephalic and atraumatic.  Eyes:     General: No scleral icterus.    Conjunctiva/sclera: Conjunctivae normal.  Neck:     Musculoskeletal: Normal range of motion and neck supple.  Cardiovascular:     Rate and Rhythm: Normal rate and regular rhythm.     Heart sounds: Normal heart sounds.  Pulmonary:     Effort: Pulmonary effort is normal. No respiratory distress.     Breath sounds: Normal breath sounds.  Abdominal:     Palpations: Abdomen is soft.     Tenderness: There is no abdominal tenderness. There  is left CVA tenderness. There is no rebound.  Skin:    General: Skin is warm and dry.  Neurological:     Mental Status: He is alert.  Psychiatric:        Behavior: Behavior normal.      ED Treatments / Results  Labs (all labs ordered are listed, but only abnormal results are displayed) Labs Reviewed  URINALYSIS, [place] PANEL  CBC    EKG None  Radiology No results found.  Procedures .Critical Care Performed by: Margarita Mail, PA-C Authorized by: Margarita Mail, PA-C   Critical care provider statement:    Critical care time (  minutes):  45   Critical care was necessary to treat or prevent imminent or life-threatening deterioration of the following conditions:  Renal failure and sepsis   Critical care was time spent personally by me on the following activities:  Discussions with consultants, evaluation of patient's response to treatment, examination of patient, ordering and performing treatments and interventions, ordering and review of laboratory studies, ordering and review of radiographic studies, pulse oximetry, re-evaluation of patient's condition, obtaining history from patient or surrogate and review of old charts   (including critical care time)  Medications Ordered in ED Medications - No data to display   Initial Impression / Assessment and Plan / ED Course  I have reviewed the triage vital signs and the nursing notes.  Pertinent labs & imaging results that were available during my care of the patient were reviewed by me and considered in my medical decision making (see chart for details).  Clinical Course as of Aug 19 1127  Mon Aug 18, 2018  0945 WBC(!): 17.9 [AH]  1125 Appears infected.  Urinalysis, Routine w reflex microscopic- may I&O cath if menses(!) [AH]    Clinical Course User Index [AH] Margarita Mail, PA-C    63 year old male who presents flank pain and vomiting since last night.  Initially the patient  did not appear very ill however had a significant change within about half an hour and became febrile with rigors, fever, tachycardia, tachypnea, his blood pressure remained stable.  Found to have urosepsis from left 8 mm UVJ ureteral stone with obstructive uropathy causing acute kidney injury.  Patient initial labs show elevated white count 18,000, elevated lactic acid, elevated serum creatinine.  Elevated blood sugar, mild hyperkalemia.  Patient be admitted to the hospitalist service.  Dr. Lovena Neighbours of urology has consulted and mentions to stent the patient this afternoon.  Final Clinical Impressions(s) / ED Diagnoses   Final diagnoses:  None    ED Discharge Orders    None       Margarita Mail, PA-C 08/18/18 1625    Gareth Morgan, MD 08/19/18 2052

## 2018-08-18 NOTE — ED Notes (Signed)
[facility] at bedside to transport patient to [facility] short stay room 7.

## 2018-08-18 NOTE — ED Notes (Signed)
Both cultures drawn prior to administering antibiotics.

## 2018-08-18 NOTE — ED Notes (Signed)
Patient transported to X-ray 

## 2018-08-18 NOTE — Consult Note (Signed)
Urology Consult   Physician requesting consult: Barb Merino, MD  Reason for consult: Left UVJ stone with urosepsis  History of Present Illness: Derrick Walker is a 63 y.o. male who presented to the [facility] ED on 08/18/18 with a 12 hour history of worsening, sharp, intermittent, non-radiating left flank pain associated with N/V and a subjective fever of 100F as well as chills.  He has a prior history of prostate cancer and erectile dysfunction, s/p RALP in in 2015 with Dr. Tresa Moore and [place] placement with Dr. Alyson Ingles in 2018.    Last PSA- <0.01 (2017)  CT Stone Study from today demonstrates a 7-8 mm left UVJ stone with moderate left hydronephrosis.  His serum creatinine is elevated to 1.8.  He has no prior hx of CKD.  He states that he is voiding w/o difficulty and denies dysuria or hematuria.  UA shows signs of acute cystitis and his WBC is elevated to 17.9.  Lactate- 2.0-3.0.  He has been started on broad spectrum abx to treat suspected urosepsis secondary to his left UVJ stone.   The patient denies a history of voiding or storage urinary symptoms, hematuria, UTIs, STDs, or urolithiasis  Past Medical History:  Diagnosis Date  . Erectile dysfunction   . History of prostate cancer urologist-  dr Orpah Melter   dx 05/ 2015 -- moderate risk Stage T2c, Gleason 3+4,  vol 152.41cc---  09/ 2015 s/p  radical prostatectomy -- per pt last PSA 07-15-2016  undectable  . Hypertension, essential   . Right inguinal hernia    from penile prosthesis reservior post surgery 11-19-2016  . Right inguinal pain   . Type 2 diabetes mellitus ([place])     Past Surgical History:  Procedure Laterality Date  . INGUINAL HERNIA REPAIR Right 01/14/2017   Procedure: HERNIA REPAIR INGUINAL ADULT;  Surgeon: Cleon Gustin, MD;  Location: [facility];  Service: Urology;  Laterality: Right;  . LYMPHADENECTOMY Bilateral 04/09/2014   Procedure: BILATERAL PELVIC LYMPH NODE DISSECTION;  Surgeon: Alexis Frock,  MD;  Location: [facility];  Service: Urology;  Laterality: Bilateral;  . PENILE PROSTHESIS IMPLANT N/A 11/19/2016   Procedure: AMS PENILE PROTHESIS INFLATABLE;  Surgeon: Cleon Gustin, MD;  Location: [facility];  Service: Urology;  Laterality: N/A;  . PENILE PROSTHESIS IMPLANT Right 01/14/2017   Procedure: REVISION PENILE PROSTHESIS;  Surgeon: Cleon Gustin, MD;  Location: [facility];  Service: Urology;  Laterality: Right;  . PENILE PROSTHESIS IMPLANT N/A 02/21/2017   Procedure: PENILE PROSTHESIS REVISION;  Surgeon: Cleon Gustin, MD;  Location: [facility];  Service: Urology;  Laterality: N/A;  . PROSTATE BIOPSY N/A 12/11/2013   Procedure: BIOPSY TRANSRECTAL ULTRASONIC PROSTATE (TUBP);  Surgeon: Arvil Persons, MD;  Location: [facility];  Service: Urology;  Laterality: N/A;  . ROBOT ASSISTED LAPAROSCOPIC RADICAL PROSTATECTOMY N/A 04/09/2014   Procedure: ROBOTIC ASSISTED LAPAROSCOPIC RADICAL PROSTATECTOMY, INDOCYANINE GREEN DYE ;  Surgeon: Alexis Frock, MD;  Location: [facility];  Service: Urology;  Laterality: N/A;  . TRANSTHORACIC ECHOCARDIOGRAM  10-14-2013   dr Shanon Brow harding   mild LVH/  ef  44-03%/  grade I diastolic dysfunction/  mild LAE    Current Hospital Medications:  Home Meds:  Current Meds  Medication Sig  . [DISCONTINUED] hydrochlorothiazide (HYDRODIURIL) 25 MG tablet TAKE 1 TABLET BY MOUTH ONCE DAILY (Patient taking differently: Take 25 mg by mouth daily. )  . [DISCONTINUED] metFORMIN (GLUCOPHAGE) 1000 MG tablet TAKE 1  TABLET BY MOUTH TWICE DAILY WITH A MEAL (Patient taking differently: Take 1,000 mg by mouth 2 (two) times daily with a meal. )    Scheduled Meds: . [MAR Hold] insulin aspart  0-9 Units Subcutaneous Q4H   Continuous Infusions: . sodium chloride 125 mL/hr at 08/18/18 1418  . [MAR Hold] ceFEPime (MAXIPIME) IV    . [MAR Hold] metronidazole Stopped (08/18/18 1400)  . [MAR Hold] vancomycin     PRN  Meds:.[MAR Hold] acetaminophen **OR** [MAR Hold] acetaminophen, [MAR Hold] ondansetron **OR** [MAR Hold] ondansetron (ZOFRAN) IV  Allergies:  Allergies  Allergen Reactions  . Shellfish Allergy Anaphylaxis  . Oxycodone Hives    Able to take percocet without problems  . Lisinopril Other (See Comments)    Pt states gives him spasm in chest  . Losartan Other (See Comments)    Kidney pain, leg swelling    Family History  Problem Relation Age of Onset  . Hypertension Mother   . Hypertension Maternal Grandmother   . Hypertension Paternal Grandmother   . Kidney failure Father   . Hypertension Brother   . Colon cancer Neg Hx   . Colon polyps Neg Hx   . Esophageal cancer Neg Hx   . Rectal cancer Neg Hx   . Stomach cancer Neg Hx     Social History:  reports that he has never smoked. He has never used smokeless tobacco. He reports that he does not drink alcohol or use drugs.  ROS: A complete review of systems was performed.  All systems are negative except for pertinent findings as noted.  Physical Exam:  Vital signs in last 24 hours: Temp:  [98 F (36.7 C)-101.2 F (38.4 C)] 100.3 F (37.9 C) (01/27 1301) Pulse Rate:  [83-114] 97 (01/27 1500) Resp:  [17-30] 21 (01/27 1500) BP: (107-193)/(50-82) 126/75 (01/27 1500) SpO2:  [93 %-100 %] 94 % (01/27 1500) Weight:  [94.3 kg] 94.3 kg (01/27 0858) Constitutional:  Alert and oriented, No acute distress Cardiovascular: Regular rate and rhythm, No JVD Respiratory: Normal respiratory effort, Lungs clear bilaterally GI: Abdomen is soft, nontender, nondistended, no abdominal masses GU: Left CVA tenderness.  IPP in place with no overt signs of component erythema or cellulitis Lymphatic: No lymphadenopathy Neurologic: Grossly intact, no focal deficits Psychiatric: Normal mood and affect  Laboratory Data:  Recent Labs    08/18/18 0857  WBC 17.9*  HGB 12.5*  HCT 37.3*  PLT 441*    Recent Labs    08/18/18 0857  NA 139  K 3.2*   CL 107  GLUCOSE 154*  BUN 14  CALCIUM 10.5*  CREATININE 1.80*     Results for orders placed or performed during the hospital encounter of 08/18/18 (from the past 24 hour(s))  Urinalysis, Routine w reflex microscopic- may I&O cath if menses     Status: Abnormal   Collection Time: 08/18/18  8:57 AM  Result Value Ref Range   Color, Urine YELLOW YELLOW   APPearance HAZY (A) CLEAR   Specific Gravity, Urine 1.013 1.005 - 1.030   pH 6.0 5.0 - 8.0   Glucose, UA 50 (A) NEGATIVE mg/dL   Hgb urine dipstick SMALL (A) NEGATIVE   Bilirubin Urine NEGATIVE NEGATIVE   Ketones, ur 5 (A) NEGATIVE mg/dL   Protein, ur NEGATIVE NEGATIVE mg/dL   Nitrite NEGATIVE NEGATIVE   Leukocytes, UA MODERATE (A) NEGATIVE   RBC / HPF 6-10 0 - 5 RBC/hpf   WBC, UA >50 (H) 0 - 5 WBC/hpf   Bacteria,  UA MANY (A) NONE SEEN  Basic metabolic panel     Status: Abnormal   Collection Time: 08/18/18  8:57 AM  Result Value Ref Range   Sodium 139 135 - 145 mmol/L   Potassium 3.2 (L) 3.5 - 5.1 mmol/L   Chloride 107 98 - 111 mmol/L   CO2 20 (L) 22 - 32 mmol/L   Glucose, Bld 154 (H) 70 - 99 mg/dL   BUN 14 8 - 23 mg/dL   Creatinine, Ser 1.80 (H) 0.61 - 1.24 mg/dL   Calcium 10.5 (H) 8.9 - 10.3 mg/dL   GFR calc non Af Amer 40 (L) >60 mL/min   GFR calc Af Amer 46 (L) >60 mL/min   Anion gap 12 5 - 15  CBC     Status: Abnormal   Collection Time: 08/18/18  8:57 AM  Result Value Ref Range   WBC 17.9 (H) 4.0 - 10.5 K/uL   RBC 4.13 (L) 4.22 - 5.81 MIL/uL   Hemoglobin 12.5 (L) 13.0 - 17.0 g/dL   HCT 37.3 (L) 39.0 - 52.0 %   MCV 90.3 80.0 - 100.0 fL   MCH 30.3 26.0 - 34.0 pg   MCHC 33.5 30.0 - 36.0 g/dL   RDW 13.2 11.5 - 15.5 %   Platelets 441 (H) 150 - 400 K/uL   nRBC 0.0 0.0 - 0.2 %  Lipase, blood     Status: None   Collection Time: 08/18/18  8:57 AM  Result Value Ref Range   Lipase 38 11 - 51 U/L  Hepatic function panel     Status: Abnormal   Collection Time: 08/18/18  8:57 AM  Result Value Ref Range   Total Protein  7.3 6.5 - 8.1 g/dL   Albumin 3.7 3.5 - 5.0 g/dL   AST 18 15 - 41 U/L   ALT 20 0 - 44 U/L   Alkaline Phosphatase 59 38 - 126 U/L   Total Bilirubin 1.5 (H) 0.3 - 1.2 mg/dL   Bilirubin, Direct 0.2 0.0 - 0.2 mg/dL   Indirect Bilirubin 1.3 (H) 0.3 - 0.9 mg/dL  Lactic acid, plasma     Status: Abnormal   Collection Time: 08/18/18 10:02 AM  Result Value Ref Range   Lactic Acid, Venous 3.0 (HH) 0.5 - 1.9 mmol/L  Lactic acid, plasma     Status: Abnormal   Collection Time: 08/18/18 12:59 PM  Result Value Ref Range   Lactic Acid, Venous 2.0 (HH) 0.5 - 1.9 mmol/L  CBG monitoring, ED     Status: None   Collection Time: 08/18/18  2:14 PM  Result Value Ref Range   Glucose-Capillary 92 70 - 99 mg/dL   No results found for this or any previous visit (from the past 240 hour(s)).  Renal Function: Recent Labs    08/18/18 0857  CREATININE 1.80*   Estimated Creatinine Clearance: 50.5 mL/min (A) (by C-G formula based on SCr of 1.8 mg/dL (H)).  Radiologic Imaging: Dg Chest Port 1 View  Result Date: 08/18/2018 CLINICAL DATA:  Sepsis. EXAM: PORTABLE CHEST 1 VIEW COMPARISON:  04/07/2014 FINDINGS: The cardiac silhouette is accentuated by low lung volumes and portable AP technique. There is mild elevation of the left hemidiaphragm with mild, heterogeneous left basilar opacity. The right lung is clear. No sizable pleural effusion or pneumothorax is identified. No acute osseous abnormality is seen. IMPRESSION: Mild left basilar atelectasis. Electronically Signed   By: Logan Bores M.D.   On: 08/18/2018 11:34   Ct Renal Stone Study  Result Date: 08/18/2018 CLINICAL DATA:  Left-sided flank pain for 2 days EXAM: CT ABDOMEN AND PELVIS WITHOUT CONTRAST TECHNIQUE: Multidetector CT imaging of the abdomen and pelvis was performed following the standard protocol without IV contrast. COMPARISON:  01/03/2017 FINDINGS: Lower chest: No acute abnormality. Hepatobiliary: No focal liver abnormality is seen. No gallstones,  gallbladder wall thickening, or biliary dilatation. Pancreas: Unremarkable. No pancreatic ductal dilatation or surrounding inflammatory changes. Spleen: Normal in size without focal abnormality. Adrenals/Urinary Tract: Adrenal glands are within normal limits bilaterally. Kidneys are well visualized bilaterally. The right kidney demonstrates no renal calculi or obstructive changes. The left kidney is enlarged with evidence of hydronephrosis and perinephric stranding. This hydronephrosis extends inferiorly to the level of the left ureterovesical junction. There is a 7-8 mm ovoid stone identified at the left UVJ. The bladder is partially distended. Stomach/Bowel: The appendix is within normal limits. No obstructive or inflammatory changes of the larger small-bowel are seen. The stomach is within normal limits. Vascular/Lymphatic: Aortic atherosclerosis. No enlarged abdominal or pelvic lymph nodes. Reproductive: Prostate has been surgically removed. Penile prosthesis is noted in place. Other: No abdominal wall hernia or abnormality. No abdominopelvic ascites. Musculoskeletal: Mild degenerative changes of lumbar spine are noted. IMPRESSION: 7-8 mm left UVJ stone with left hydronephrosis and hydroureter. No other focal abnormality is noted. Electronically Signed   By: Inez Catalina M.D.   On: 08/18/2018 10:12    I independently reviewed the above imaging studies.  Impression/Recommendation 1.  Obstructing 7-8 mm left UVJ stone with urosepsis 2.  History of prostate cancer s/p RALP in 2015 ([facility]) 3.  History of post-prostatectomy erectile dysfunction, s/p IPP in 2018  -The risks, benefits and alternatives of cystoscopy with LEFT ureteroscopy, laser lithotripsy and ureteral stent placement was discussed the patient.  Risks included, but are not limited to: bleeding, urinary tract infection, ureteral injury/avulsion, ureteral stricture formation, retained stone fragments, the possibility that multiple surgeries may  be required to treat the stone(s), MI, stroke, PE and the inherent risks of general anesthesia.  The patient voices understanding and wishes to proceed.     -Urine and blood cultures pending.  He received cefepime, vanc and flagyl at [facility].   -Continue IVF resuscitation   Ellison Hughs, MD [facility] 08/18/2018, 4:01 PM

## 2018-08-18 NOTE — ED Triage Notes (Signed)
Pt endorses left sided flank pain that began last night radiating to left abd. Denies hematuria. 1 episode of n/v. No diarrhea. VSS.

## 2018-08-18 NOTE — Op Note (Signed)
Operative Note  Preoperative diagnosis:  1.  8 mm left UVJ calculus 2.  Left hydronephrosis 3.  Urosepsis 4.  Acute kidney injury  Postoperative diagnosis: 1.  Same  Procedure(s): 1.  Cystoscopy with left JJ stent placement 2.  Left retrograde pyelogram with intraoperative interpretation of fluoroscopic imaging 3.  Foley catheter placement  Surgeon: Ellison Hughs, MD  Assistants:  None  Anesthesia:  General  Complications:  None  EBL: Less than 5 mL  Specimens: 1.  Urine for culture and sensitivity  Drains/Catheters: 1.  Left 6 French by 26 cm JJ stent without tether 2.  18 French Foley catheter with 10 mL in the catheter balloon  Intraoperative findings:   1. Purulent material was expressed from the left ureteral orifice following wire manipulation of his distal ureteral stone. 2. Left retrograde pyelogram was obtained to confirm proper wire placement within the left collecting system  Indication:  Derrick Walker is a 63 y.o. male with urosepsis from an obstructing 8 mm left UVJ calculus.  He has been consented for the above procedures, voices understanding and wishes to proceed.  Description of procedure:  After informed consent was obtained, the patient was brought to the operating room and general LMA anesthesia was administered. The patient was then placed in the dorsolithotomy position and prepped and draped in usual sterile fashion. A timeout was performed. A 23 French rigid cystoscope was then inserted into the urethral meatus and advanced into the bladder under direct vision. A complete bladder survey revealed no intravesical pathology.  I then attempted to advance a sensor wire into the left ureteral orifice, but was met with resistance.  A 5 French ureteral catheter was then advanced over the wire to aid with wire placement in the ureter.  I was finally able to manipulate the wire up the left ureter, confirming placement via fluoroscopy and left great  pyelogram.  Immediately following placement of the safety wire, a large amount of purulent debris was expressed from the left ureteral orifice.  At that point, any notion of ureteroscopy was abandoned.  I then placed a 6 [place] by 26 cm JJ stent over the wire and into good position within the left collecting system, confirming placement via fluoroscopy.  A urine specimen was obtained for culture and sensitivity.  The cystoscope was removed and an 26 French Foley catheter was placed within the bladder and set to gravity drainage.  The patient tolerated the procedure well and was transferred to the postanesthesia in stable condition.  Plan: Keep Foley catheter in place overnight.  Continue broad-spectrum antibiotics until culture and sensitivities have returned.  He will need a total of 2 weeks of antibiotic coverage.  I will arrange outpatient follow-up to schedule ureteroscopy to address his obstructing left ureteral stone.

## 2018-08-18 NOTE — Anesthesia Preprocedure Evaluation (Signed)
Anesthesia Evaluation  Patient identified by MRN, date of birth, ID band Patient awake    Reviewed: Allergy & Precautions, NPO status , Patient's Chart, lab work & pertinent test results  Airway Mallampati: I  TM Distance: >3 FB Neck ROM: Full    Dental   Pulmonary    Pulmonary exam normal        Cardiovascular hypertension, Pt. on medications Normal cardiovascular exam     Neuro/Psych    GI/Hepatic   Endo/Other  diabetes, Type 2  Renal/GU      Musculoskeletal   Abdominal   Peds  Hematology   Anesthesia Other Findings   Reproductive/Obstetrics                             Anesthesia Physical Anesthesia Plan  ASA: II  Anesthesia Plan: General   Post-op Pain Management:    Induction: Intravenous  PONV Risk Score and Plan: 2 and Ondansetron and Midazolam  Airway Management Planned: Oral ETT  Additional Equipment:   Intra-op Plan:   Post-operative Plan: Extubation in OR  Informed Consent: I have reviewed the patients History and Physical, chart, labs and discussed the procedure including the risks, benefits and alternatives for the proposed anesthesia with the patient or authorized representative who has indicated his/her understanding and acceptance.       Plan Discussed with: CRNA and Surgeon  Anesthesia Plan Comments:         Anesthesia Quick Evaluation

## 2018-08-18 NOTE — Transfer of Care (Signed)
Immediate Anesthesia Transfer of Care Note  Patient: Derrick Walker  Procedure(s) Performed: CYSTOSCOPY/LEFT /STENT PLACEMENT (Left Urethra)  Patient Location: PACU  Anesthesia Type:General  Level of Consciousness: drowsy and patient cooperative  Airway & Oxygen Therapy: Patient Spontanous Breathing and Patient connected to face mask oxygen  Post-op Assessment: Report given to RN and Post -op Vital signs reviewed and stable  Post vital signs: Reviewed and stable  Last Vitals:  Vitals Value Taken Time  BP 162/96 08/18/2018  8:32 PM  Temp    Pulse 115 08/18/2018  8:35 PM  Resp 27 08/18/2018  8:35 PM  SpO2 99 % 08/18/2018  8:35 PM  Vitals shown include unvalidated device data.  Last Pain:  Vitals:   08/18/18 1814  TempSrc:   PainSc: 5       Patients Stated Pain Goal: 4 (75/79/72 8206)  Complications: No apparent anesthesia complications

## 2018-08-18 NOTE — Anesthesia Postprocedure Evaluation (Signed)
Anesthesia Post Note  Patient: WANG GRANADA  Procedure(s) Performed: CYSTOSCOPY/LEFT /STENT PLACEMENT (Left Urethra)     Patient location during evaluation: PACU Anesthesia Type: General Level of consciousness: awake and alert Pain management: pain level controlled Vital Signs Assessment: post-procedure vital signs reviewed and stable Respiratory status: spontaneous breathing, nonlabored ventilation, respiratory function stable and patient connected to nasal cannula oxygen Cardiovascular status: blood pressure returned to baseline and stable Postop Assessment: no apparent nausea or vomiting Anesthetic complications: no    Last Vitals:  Vitals:   08/18/18 1908 08/18/18 2030  BP: 119/64 (!) 162/96  Pulse: 95 (!) 120  Resp: (!) 22 (!) 33  Temp:  37.1 C  SpO2: 100% 100%    Last Pain:  Vitals:   08/18/18 2030  TempSrc:   PainSc: 0-No pain                 Rainie Crenshaw DAVID

## 2018-08-18 NOTE — Progress Notes (Signed)
Pharmacy Antibiotic Note  Derrick Walker is a 63 y.o. male admitted on 08/18/2018 with sepsis. Pharmacy has been consulted for vancomycin and cefepime dosing. Pt is afebrile but WBC is elevated at 17.9. SCr is elevated above baseline at 1.8.   Plan: Vancomycin 2gm IV x 1 then 1500mg  IV Q24H Cefepime 2gm IV Q12H F/u renal fxn, C&S, clinical status and peak/trough at SS  Height: 5\' 11"  (180.3 cm) Weight: 208 lb (94.3 kg) IBW/kg (Calculated) : 75.3  Temp (24hrs), Avg:98 F (36.7 C), Min:98 F (36.7 C), Max:98 F (36.7 C)  Recent Labs  Lab 08/18/18 0857  WBC 17.9*  CREATININE 1.80*    Estimated Creatinine Clearance: 50.5 mL/min (A) (by C-G formula based on SCr of 1.8 mg/dL (H)).    Allergies  Allergen Reactions  . Shellfish Allergy Anaphylaxis  . Oxycodone Hives    Able to take percocet without problems  . Lisinopril Other (See Comments)    Pt states gives him spasm in chest  . Losartan Other (See Comments)    Kidney pain, leg swelling    Antimicrobials this admission: Vanc 1/27>> Cefepime 1/27>> Flagyl 1/27>>  Dose adjustments this admission: N/A  Microbiology results: Pending  Thank you for allowing pharmacy to be a part of this patient's care.  Derrick Walker, Derrick Walker 08/18/2018 9:52 AM

## 2018-08-19 ENCOUNTER — Encounter (HOSPITAL_COMMUNITY): Payer: Self-pay | Admitting: Urology

## 2018-08-19 DIAGNOSIS — N139 Obstructive and reflux uropathy, unspecified: Secondary | ICD-10-CM

## 2018-08-19 DIAGNOSIS — A4150 Gram-negative sepsis, unspecified: Secondary | ICD-10-CM

## 2018-08-19 DIAGNOSIS — E119 Type 2 diabetes mellitus without complications: Secondary | ICD-10-CM

## 2018-08-19 DIAGNOSIS — N179 Acute kidney failure, unspecified: Secondary | ICD-10-CM

## 2018-08-19 LAB — GLUCOSE, CAPILLARY
GLUCOSE-CAPILLARY: 101 mg/dL — AB (ref 70–99)
Glucose-Capillary: 100 mg/dL — ABNORMAL HIGH (ref 70–99)
Glucose-Capillary: 108 mg/dL — ABNORMAL HIGH (ref 70–99)
Glucose-Capillary: 124 mg/dL — ABNORMAL HIGH (ref 70–99)
Glucose-Capillary: 130 mg/dL — ABNORMAL HIGH (ref 70–99)
Glucose-Capillary: 89 mg/dL (ref 70–99)
Glucose-Capillary: 96 mg/dL (ref 70–99)

## 2018-08-19 LAB — BLOOD CULTURE ID PANEL (REFLEXED)
Acinetobacter baumannii: NOT DETECTED
CARBAPENEM RESISTANCE: NOT DETECTED
Candida albicans: NOT DETECTED
Candida glabrata: NOT DETECTED
Candida krusei: NOT DETECTED
Candida parapsilosis: NOT DETECTED
Candida tropicalis: NOT DETECTED
Enterobacter cloacae complex: NOT DETECTED
Enterobacteriaceae species: DETECTED — AB
Enterococcus species: NOT DETECTED
Escherichia coli: DETECTED — AB
Haemophilus influenzae: NOT DETECTED
Klebsiella oxytoca: NOT DETECTED
Klebsiella pneumoniae: NOT DETECTED
Listeria monocytogenes: NOT DETECTED
NEISSERIA MENINGITIDIS: NOT DETECTED
PROTEUS SPECIES: NOT DETECTED
Pseudomonas aeruginosa: NOT DETECTED
STAPHYLOCOCCUS AUREUS BCID: NOT DETECTED
Serratia marcescens: NOT DETECTED
Staphylococcus species: NOT DETECTED
Streptococcus agalactiae: NOT DETECTED
Streptococcus pneumoniae: NOT DETECTED
Streptococcus pyogenes: NOT DETECTED
Streptococcus species: NOT DETECTED

## 2018-08-19 LAB — BASIC METABOLIC PANEL
Anion gap: 7 (ref 5–15)
BUN: 22 mg/dL (ref 8–23)
CO2: 23 mmol/L (ref 22–32)
Calcium: 8.9 mg/dL (ref 8.9–10.3)
Chloride: 108 mmol/L (ref 98–111)
Creatinine, Ser: 2.16 mg/dL — ABNORMAL HIGH (ref 0.61–1.24)
GFR calc Af Amer: 37 mL/min — ABNORMAL LOW (ref >60)
GFR calc non Af Amer: 32 mL/min — ABNORMAL LOW (ref >60)
GLUCOSE: 129 mg/dL — AB (ref 70–99)
Potassium: 3.7 mmol/L (ref 3.5–5.1)
Sodium: 138 mmol/L (ref 135–145)

## 2018-08-19 LAB — CBC
HCT: 30.2 % — ABNORMAL LOW (ref 39.0–52.0)
Hemoglobin: 9.7 g/dL — ABNORMAL LOW (ref 13.0–17.0)
MCH: 30.1 pg (ref 26.0–34.0)
MCHC: 32.1 g/dL (ref 30.0–36.0)
MCV: 93.8 fL (ref 80.0–100.0)
Platelets: 318 10*3/uL (ref 150–400)
RBC: 3.22 MIL/uL — ABNORMAL LOW (ref 4.22–5.81)
RDW: 13.8 % (ref 11.5–15.5)
WBC: 23.3 10*3/uL — ABNORMAL HIGH (ref 4.0–10.5)
nRBC: 0 % (ref 0.0–0.2)

## 2018-08-19 LAB — HIV ANTIBODY (ROUTINE TESTING W REFLEX): HIV SCREEN 4TH GENERATION: NONREACTIVE

## 2018-08-19 LAB — MRSA PCR SCREENING: MRSA BY PCR: NEGATIVE

## 2018-08-19 MED ORDER — PHENAZOPYRIDINE HCL 200 MG PO TABS: 200.0000 mg | ORAL_TABLET | Freq: Three times a day (TID) | ORAL | 0 refills | Status: AC | PRN

## 2018-08-19 MED ORDER — ONDANSETRON HCL 4 MG PO TABS: 4.0000 mg | ORAL_TABLET | Freq: Every day | ORAL | 1 refills | Status: DC | PRN

## 2018-08-19 MED ORDER — ZOLPIDEM TARTRATE 5 MG PO TABS
5.0000 mg | ORAL_TABLET | Freq: Every evening | ORAL | Status: DC | PRN
  Administered 2018-08-19: 5 mg via ORAL
  Filled 2018-08-19: qty 1

## 2018-08-19 MED ORDER — OXYBUTYNIN CHLORIDE 5 MG PO TABS: 5.0000 mg | ORAL_TABLET | Freq: Three times a day (TID) | ORAL | 1 refills | Status: DC | PRN

## 2018-08-19 MED ORDER — SODIUM CHLORIDE 0.9 % IV SOLN
2.0000 g | INTRAVENOUS | Status: DC
  Administered 2018-08-19 – 2018-08-20 (×2): 2 g via INTRAVENOUS
  Filled 2018-08-19: qty 2
  Filled 2018-08-19: qty 20

## 2018-08-19 MED ORDER — TAMSULOSIN HCL 0.4 MG PO CAPS: 0.4000 mg | ORAL_CAPSULE | Freq: Every day | ORAL | 0 refills | Status: DC

## 2018-08-19 MED ORDER — HYDROCODONE-ACETAMINOPHEN 5-325 MG PO TABS: 1.0000 | ORAL_TABLET | ORAL | 0 refills | Status: DC | PRN

## 2018-08-19 MED ORDER — MORPHINE SULFATE (PF) 2 MG/ML IV SOLN
2.0000 mg | INTRAVENOUS | Status: DC | PRN
  Administered 2018-08-19: 2 mg via INTRAVENOUS
  Filled 2018-08-19: qty 1

## 2018-08-19 MED ORDER — HYDROCODONE-ACETAMINOPHEN 5-325 MG PO TABS
1.0000 | ORAL_TABLET | ORAL | Status: DC | PRN
  Administered 2018-08-20 (×3): 2 via ORAL
  Filled 2018-08-19 (×3): qty 2

## 2018-08-19 MED ORDER — HYDROCODONE-ACETAMINOPHEN 5-325 MG PO TABS
1.0000 | ORAL_TABLET | ORAL | Status: DC | PRN
  Administered 2018-08-19 (×4): 1 via ORAL
  Administered 2018-08-19: 2 via ORAL
  Filled 2018-08-19 (×5): qty 1

## 2018-08-19 NOTE — Progress Notes (Signed)
[facility] PROGRESS NOTE  Derrick Walker EHO:[id number] DOB: Nov 04, 1955 DOA: 08/18/2018  PCP: Binnie Rail, MD  Brief History/Interval Summary: 63 y.o. male with medical history significant of type 2 diabetes on metformin, history of penile prosthesis and complications that was treated, hypertension who presented to the hospital with acute severe onset of left-sided flank pain nausea and fever at home.    His UA looked abnormal.  CT scan showed a left UVJ stone which was 7 mm in size along with a moderate left-sided hydronephrosis and hydroureter.  Patient was admitted to the hospital.  He was noted to be in sepsis.  He was given IV fluids.  Urology was consulted.   Reason for Visit: Sepsis secondary to urinary tract infection/acute pyelonephritis.  Obstructing stone.  E. coli bacteremia  Consultants: Urology  Procedures:  1.  Cystoscopy with left JJ stent placement 2.  Left retrograde pyelogram with intraoperative interpretation of fluoroscopic imaging 3.  Foley catheter placement   Antibiotics: Initially placed on vancomycin and cefepime.  Transition to ceftriaxone today.  Subjective/Interval History: Patient states that he is feeling much better compared to yesterday the pain in the left side has improved.  He is making urine.  Does not feel any dizziness or lightheadedness.  ROS: Denies any chest pain or shortness of breath.  Does have a cough.  Objective:  Vital Signs  Vitals:   08/19/18 0443 08/19/18 0500 08/19/18 0600 08/19/18 0740  BP:  114/66 (!) 116/56   Pulse:  79 77   Resp:  18 19   Temp: 98.4 F (36.9 C)   98.1 F (36.7 C)  TempSrc: Oral   Oral  SpO2:  96% 94%   Weight:      Height:        Intake/Output Summary (Last 24 hours) at 08/19/2018 1027 Last data filed at 08/19/2018 3704 Gross per 24 hour  Intake 4485.6 ml  Output 150 ml  Net 4335.6 ml   Filed Weights   08/18/18 0858 08/18/18 1617  Weight: 94.3 kg 94.3 kg    General appearance:  alert, cooperative, appears stated age and no distress Head: Normocephalic, without obvious abnormality, atraumatic Resp: Normal effort.  Clear to auscultation bilaterally. Cardio: regular rate and rhythm, S1, S2 normal, no murmur, click, rub or gallop GI: soft, non-tender; bowel sounds normal; no masses,  no organomegaly Extremities: extremities normal, atraumatic, no cyanosis or edema Pulses: 2+ and symmetric Neurologic: Alert and oriented x3.  No focal neurological deficits.  Lab Results:  Data Reviewed: I have personally reviewed following labs and imaging studies  CBC: Recent Labs  Lab 08/18/18 0857 08/19/18 0319  WBC 17.9* 23.3*  HGB 12.5* 9.7*  HCT 37.3* 30.2*  MCV 90.3 93.8  PLT 441* 888    Basic Metabolic Panel: Recent Labs  Lab 08/18/18 0857 08/19/18 0319  NA 139 138  K 3.2* 3.7  CL 107 108  CO2 20* 23  GLUCOSE 154* 129*  BUN 14 22  CREATININE 1.80* 2.16*  CALCIUM 10.5* 8.9    GFR: Estimated Creatinine Clearance: 42.1 mL/min (A) (by C-G formula based on SCr of 2.16 mg/dL (H)).  Liver Function Tests: Recent Labs  Lab 08/18/18 0857  AST 18  ALT 20  ALKPHOS 59  BILITOT 1.5*  PROT 7.3  ALBUMIN 3.7    Recent Labs  Lab 08/18/18 0857  LIPASE 38    CBG: Recent Labs  Lab 08/18/18 1628 08/18/18 2112 08/19/18 0012 08/19/18 0350 08/19/18 0811  GLUCAP 103*  101* 130* 108* 96     Recent Results (from the past 240 hour(s))  Blood Culture (routine x 2)     Status: None (Preliminary result)   Collection Time: 08/18/18  9:47 AM  Result Value Ref Range Status   Specimen Description BLOOD RIGHT ANTECUBITAL  Final   Special Requests   Final    BOTTLES DRAWN AEROBIC AND ANAEROBIC Blood Culture adequate volume   Culture  Setup Time   Final    IN BOTH AEROBIC AND ANAEROBIC BOTTLES GRAM NEGATIVE RODS CRITICAL VALUE NOTED.  VALUE IS CONSISTENT WITH PREVIOUSLY REPORTED AND CALLED VALUE. Performed at [facility], [place] [address] [place],  [place] [address]    Culture GRAM NEGATIVE RODS  Final   Report Status PENDING  Incomplete  Blood Culture (routine x 2)     Status: Abnormal (Preliminary result)   Collection Time: 08/18/18 10:02 AM  Result Value Ref Range Status   Specimen Description BLOOD LEFT ANTECUBITAL  Final   Special Requests   Final    BOTTLES DRAWN AEROBIC AND ANAEROBIC Blood Culture adequate volume   Culture  Setup Time   Final    IN BOTH AEROBIC AND ANAEROBIC BOTTLES GRAM NEGATIVE RODS CRITICAL RESULT CALLED TO, READ BACK BY AND VERIFIED WITH: Lavell Luster [facility] 08/18/18 2348 JDW    Culture (A)  Final    ESCHERICHIA COLI SUSCEPTIBILITIES TO FOLLOW Performed at [facility], [place] [address], [place], [place] [address]    Report Status PENDING  Incomplete  Blood Culture ID Panel (Reflexed)     Status: Abnormal   Collection Time: 08/18/18 10:02 AM  Result Value Ref Range Status   Enterococcus species NOT DETECTED NOT DETECTED Final   Listeria monocytogenes NOT DETECTED NOT DETECTED Final   Staphylococcus species NOT DETECTED NOT DETECTED Final   Staphylococcus aureus (BCID) NOT DETECTED NOT DETECTED Final   Streptococcus species NOT DETECTED NOT DETECTED Final   Streptococcus agalactiae NOT DETECTED NOT DETECTED Final   Streptococcus pneumoniae NOT DETECTED NOT DETECTED Final   Streptococcus pyogenes NOT DETECTED NOT DETECTED Final   Acinetobacter baumannii NOT DETECTED NOT DETECTED Final   Enterobacteriaceae species DETECTED (A) NOT DETECTED Final    Comment: Enterobacteriaceae represent a large family of gram-negative bacteria, not a single organism. CRITICAL RESULT CALLED TO, READ BACK BY AND VERIFIED WITH: J GRIMSLEY PHARMD 08/18/18 2348 JDW    Enterobacter cloacae complex NOT DETECTED NOT DETECTED Final   Escherichia coli DETECTED (A) NOT DETECTED Final    Comment: CRITICAL RESULT CALLED TO, READ BACK BY AND VERIFIED WITH: Lavell Luster [facility] 08/18/18 2348 JDW    Klebsiella oxytoca NOT DETECTED NOT  DETECTED Final   Klebsiella pneumoniae NOT DETECTED NOT DETECTED Final   Proteus species NOT DETECTED NOT DETECTED Final   Serratia marcescens NOT DETECTED NOT DETECTED Final   Carbapenem resistance NOT DETECTED NOT DETECTED Final   Haemophilus influenzae NOT DETECTED NOT DETECTED Final   Neisseria meningitidis NOT DETECTED NOT DETECTED Final   Pseudomonas aeruginosa NOT DETECTED NOT DETECTED Final   Candida albicans NOT DETECTED NOT DETECTED Final   Candida glabrata NOT DETECTED NOT DETECTED Final   Candida krusei NOT DETECTED NOT DETECTED Final   Candida parapsilosis NOT DETECTED NOT DETECTED Final   Candida tropicalis NOT DETECTED NOT DETECTED Final    Comment: Performed at [facility], [place] [address], [place], [place] [address]  MRSA PCR Screening     Status: None   Collection  Time: 08/18/18 11:07 PM  Result Value Ref Range Status   MRSA by PCR NEGATIVE NEGATIVE Final    Comment:        The GeneXpert MRSA Assay (FDA approved for NASAL specimens only), is one component of a comprehensive MRSA colonization surveillance program. It is not intended to diagnose MRSA infection nor to guide or monitor treatment for MRSA infections. Performed at [facility], [place] [address], [place], [place] [address]       Radiology Studies: Dg Chest Port 1 View  Result Date: 08/18/2018 CLINICAL DATA:  Sepsis. EXAM: PORTABLE CHEST 1 VIEW COMPARISON:  04/07/2014 FINDINGS: The cardiac silhouette is accentuated by low lung volumes and portable AP technique. There is mild elevation of the left hemidiaphragm with mild, heterogeneous left basilar opacity. The right lung is clear. No sizable pleural effusion or pneumothorax is identified. No acute osseous abnormality is seen. IMPRESSION: Mild left basilar atelectasis. Electronically Signed   By: Logan Bores M.D.   On: 08/18/2018 11:34   Dg C-arm 1-60 Min-no Report  Result Date: 08/18/2018 Fluoroscopy was utilized by the  requesting physician.  No radiographic interpretation.   Ct Renal Stone Study  Result Date: 08/18/2018 CLINICAL DATA:  Left-sided flank pain for 2 days EXAM: CT ABDOMEN AND PELVIS WITHOUT CONTRAST TECHNIQUE: Multidetector CT imaging of the abdomen and pelvis was performed following the standard protocol without IV contrast. COMPARISON:  01/03/2017 FINDINGS: Lower chest: No acute abnormality. Hepatobiliary: No focal liver abnormality is seen. No gallstones, gallbladder wall thickening, or biliary dilatation. Pancreas: Unremarkable. No pancreatic ductal dilatation or surrounding inflammatory changes. Spleen: Normal in size without focal abnormality. Adrenals/Urinary Tract: Adrenal glands are within normal limits bilaterally. Kidneys are well visualized bilaterally. The right kidney demonstrates no renal calculi or obstructive changes. The left kidney is enlarged with evidence of hydronephrosis and perinephric stranding. This hydronephrosis extends inferiorly to the level of the left ureterovesical junction. There is a 7-8 mm ovoid stone identified at the left UVJ. The bladder is partially distended. Stomach/Bowel: The appendix is within normal limits. No obstructive or inflammatory changes of the larger small-bowel are seen. The stomach is within normal limits. Vascular/Lymphatic: Aortic atherosclerosis. No enlarged abdominal or pelvic lymph nodes. Reproductive: Prostate has been surgically removed. Penile prosthesis is noted in place. Other: No abdominal wall hernia or abnormality. No abdominopelvic ascites. Musculoskeletal: Mild degenerative changes of lumbar spine are noted. IMPRESSION: 7-8 mm left UVJ stone with left hydronephrosis and hydroureter. No other focal abnormality is noted. Electronically Signed   By: Inez Catalina M.D.   On: 08/18/2018 10:12     Medications:  Scheduled: . insulin aspart  0-9 Units Subcutaneous Q4H   Continuous: . sodium chloride 125 mL/hr at 08/18/18 2357  . cefTRIAXone  (ROCEPHIN)  IV 2 g (08/19/18 0900)   CHE:NIDPOEUMPNTIR **OR** acetaminophen, HYDROcodone-acetaminophen, morphine injection, ondansetron **OR** ondansetron (ZOFRAN) IV    Assessment/Plan:    Sepsis secondary to acute pyelonephritis/E. coli bacteremia/left obstructing stone Patient's blood cultures positive for E. coli.  Patient underwent cystoscopy and a stent placement yesterday for obstructing stone.  Patient was initially given broad-spectrum coverage.  In view of the E. coli he will be transitioned to ceftriaxone 2 g daily.  Sepsis physiology appears to have improved.  Continue IV fluids for now.  Lactic acidosis which had improved.  WBC is noted to be high.  HIV was nonreactive.  Acute renal failure/obstructive uropathy This is secondary to the stone.  Creatinine slightly higher today compared to yesterday.  Continue IV fluids.  This should improve eventually.  Recheck labs tomorrow.  Monitor urine output.  Cough Probably due to atelectasis.  Incentive spirometry.  Chest x-ray did not show any acute findings.  Hypokalemia Corrected this morning.  Normocytic anemia Drop in hemoglobin is most likely dilutional.  No evidence of overt bleeding.  Check anemia panel in the morning.  History of essential hypertension Currently being monitored off of his antihypertensives.  Diabetes mellitus type 2 He is on metformin at home which is being held.  Continue SSI.  Monitor CBGs.  HbA1c 6.2 in July 2019.  DVT Prophylaxis: SCDs.  Start mobilizing.    Code Status: Full code Family Communication: Discussed with the patient Disposition Plan: Management as outlined above.    LOS: 1 day   Marliyah Reid [employer] on [email or web address]  08/19/2018, 10:27 AM

## 2018-08-19 NOTE — Progress Notes (Signed)
PHARMACY - PHYSICIAN COMMUNICATION CRITICAL VALUE ALERT - BLOOD CULTURE IDENTIFICATION (BCID)  Derrick Walker is an 63 y.o. male who presented to [facility] on 08/18/2018 with a chief complaint of flank pain  Assessment:  Patient in ICU post procedure and now with E. coli ([facility] -) bacteremia.  (include suspected source if known)  Name of physician (or Provider) Contacted: Will follow up with AM team  Current antibiotics: Vancomycin, cefepime   Changes to prescribed antibiotics recommended:  Will follow up with AM team to see if narrowing or different regimen desired based on BCID results--Per algorithm--Ceftriaxone 2gm iv q24hr  Results for orders placed or performed during the hospital encounter of 08/18/18  Blood Culture ID Panel (Reflexed) (Collected: 08/18/2018 10:02 AM)  Result Value Ref Range   Enterococcus species NOT DETECTED NOT DETECTED   Listeria monocytogenes NOT DETECTED NOT DETECTED   Staphylococcus species NOT DETECTED NOT DETECTED   Staphylococcus aureus (BCID) NOT DETECTED NOT DETECTED   Streptococcus species NOT DETECTED NOT DETECTED   Streptococcus agalactiae NOT DETECTED NOT DETECTED   Streptococcus pneumoniae NOT DETECTED NOT DETECTED   Streptococcus pyogenes NOT DETECTED NOT DETECTED   Acinetobacter baumannii NOT DETECTED NOT DETECTED   Enterobacteriaceae species DETECTED (A) NOT DETECTED   Enterobacter cloacae complex NOT DETECTED NOT DETECTED   Escherichia coli DETECTED (A) NOT DETECTED   Klebsiella oxytoca NOT DETECTED NOT DETECTED   Klebsiella pneumoniae NOT DETECTED NOT DETECTED   Proteus species NOT DETECTED NOT DETECTED   Serratia marcescens NOT DETECTED NOT DETECTED   Carbapenem resistance NOT DETECTED NOT DETECTED   Haemophilus influenzae NOT DETECTED NOT DETECTED   Neisseria meningitidis NOT DETECTED NOT DETECTED   Pseudomonas aeruginosa NOT DETECTED NOT DETECTED   Candida albicans NOT DETECTED NOT DETECTED   Candida glabrata NOT DETECTED NOT  DETECTED   Candida krusei NOT DETECTED NOT DETECTED   Candida parapsilosis NOT DETECTED NOT DETECTED   Candida tropicalis NOT DETECTED NOT DETECTED    Nani Skillern Crowford 08/19/2018  4:10 AM

## 2018-08-19 NOTE — Progress Notes (Signed)
1 Day Post-Op Subjective: Pt was monitored in stepdown unit overnight.  He states that his left flank pain comes and goes and is partially alleviated with norco.  He denies bladder spasms or N/V.  Afebrile overnight.  Blood culture grew E. Coli--sensitivities pending   Objective: Vital signs in last 24 hours: Temp:  [98.1 F (36.7 C)-101.2 F (38.4 C)] 98.1 F (36.7 C) (01/28 0740) Pulse Rate:  [77-120] 77 (01/28 0600) Resp:  [9-33] 19 (01/28 0600) BP: (98-162)/(41-96) 116/56 (01/28 0600) SpO2:  [93 %-100 %] 94 % (01/28 0600) Weight:  [94.3 kg] 94.3 kg (01/27 1617)  Intake/Output from previous day: 01/27 0701 - 01/28 0700 In: 4265.6 [I.V.:1564.1; IV Piggyback:2701.5] Out: 150 [Urine:150]  Intake/Output this shift: No intake/output data recorded.  Physical Exam:  General: Alert and oriented CV: RRR, palpable distal pulses Lungs: CTAB, equal chest rise Abdomen: Soft, NTND, no rebound or guarding Gu: Foley in place and draining amber urine Ext: NT, No erythema  Lab Results: Recent Labs    08/18/18 0857 08/19/18 0319  HGB 12.5* 9.7*  HCT 37.3* 30.2*   BMET Recent Labs    08/18/18 0857 08/19/18 0319  NA 139 138  K 3.2* 3.7  CL 107 108  CO2 20* 23  GLUCOSE 154* 129*  BUN 14 22  CREATININE 1.80* 2.16*  CALCIUM 10.5* 8.9     Studies/Results: Dg Chest Port 1 View  Result Date: 08/18/2018 CLINICAL DATA:  Sepsis. EXAM: PORTABLE CHEST 1 VIEW COMPARISON:  04/07/2014 FINDINGS: The cardiac silhouette is accentuated by low lung volumes and portable AP technique. There is mild elevation of the left hemidiaphragm with mild, heterogeneous left basilar opacity. The right lung is clear. No sizable pleural effusion or pneumothorax is identified. No acute osseous abnormality is seen. IMPRESSION: Mild left basilar atelectasis. Electronically Signed   By: Logan Bores M.D.   On: 08/18/2018 11:34   Dg C-arm 1-60 Min-no Report  Result Date: 08/18/2018 Fluoroscopy was utilized by  the requesting physician.  No radiographic interpretation.   Ct Renal Stone Study  Result Date: 08/18/2018 CLINICAL DATA:  Left-sided flank pain for 2 days EXAM: CT ABDOMEN AND PELVIS WITHOUT CONTRAST TECHNIQUE: Multidetector CT imaging of the abdomen and pelvis was performed following the standard protocol without IV contrast. COMPARISON:  01/03/2017 FINDINGS: Lower chest: No acute abnormality. Hepatobiliary: No focal liver abnormality is seen. No gallstones, gallbladder wall thickening, or biliary dilatation. Pancreas: Unremarkable. No pancreatic ductal dilatation or surrounding inflammatory changes. Spleen: Normal in size without focal abnormality. Adrenals/Urinary Tract: Adrenal glands are within normal limits bilaterally. Kidneys are well visualized bilaterally. The right kidney demonstrates no renal calculi or obstructive changes. The left kidney is enlarged with evidence of hydronephrosis and perinephric stranding. This hydronephrosis extends inferiorly to the level of the left ureterovesical junction. There is a 7-8 mm ovoid stone identified at the left UVJ. The bladder is partially distended. Stomach/Bowel: The appendix is within normal limits. No obstructive or inflammatory changes of the larger small-bowel are seen. The stomach is within normal limits. Vascular/Lymphatic: Aortic atherosclerosis. No enlarged abdominal or pelvic lymph nodes. Reproductive: Prostate has been surgically removed. Penile prosthesis is noted in place. Other: No abdominal wall hernia or abnormality. No abdominopelvic ascites. Musculoskeletal: Mild degenerative changes of lumbar spine are noted. IMPRESSION: 7-8 mm left UVJ stone with left hydronephrosis and hydroureter. No other focal abnormality is noted. Electronically Signed   By: Inez Catalina M.D.   On: 08/18/2018 10:12    Assessment/Plan: 63 year old male  with urosepsis and E. Coli bacteremia 2/2 to an obstructing left UVJ stone. POD 1 s/p left JJ stent placement.     -Continue broad spectrum abx until sensitivities have returned.  I will tentatively schedule him for an OP ureteroscopy in the next 2-3 weeks to definitively treat his left ureteral stone.   Foley out tomorrow morning.  I will write PRN pain medications for home.    LOS: 1 day   Ellison Hughs, MD [facility] Pager: [phone]  08/19/2018, 9:53 AM

## 2018-08-20 ENCOUNTER — Telehealth: Payer: Self-pay | Admitting: *Deleted

## 2018-08-20 LAB — BASIC METABOLIC PANEL
ANION GAP: 6 (ref 5–15)
BUN: 19 mg/dL (ref 8–23)
CO2: 23 mmol/L (ref 22–32)
Calcium: 9.9 mg/dL (ref 8.9–10.3)
Chloride: 109 mmol/L (ref 98–111)
Creatinine, Ser: 1.35 mg/dL — ABNORMAL HIGH (ref 0.61–1.24)
GFR calc Af Amer: >60 mL/min (ref >60)
GFR calc non Af Amer: 56 mL/min — ABNORMAL LOW (ref >60)
Glucose, Bld: 90 mg/dL (ref 70–99)
POTASSIUM: 3.3 mmol/L — AB (ref 3.5–5.1)
Sodium: 138 mmol/L (ref 135–145)

## 2018-08-20 LAB — CULTURE, BLOOD (ROUTINE X 2)
Special Requests: ADEQUATE
Special Requests: ADEQUATE

## 2018-08-20 LAB — FOLATE: Folate: 2.4 ng/mL — ABNORMAL LOW (ref >5.9)

## 2018-08-20 LAB — GLUCOSE, CAPILLARY
GLUCOSE-CAPILLARY: 75 mg/dL (ref 70–99)
Glucose-Capillary: 100 mg/dL — ABNORMAL HIGH (ref 70–99)
Glucose-Capillary: 90 mg/dL (ref 70–99)
Glucose-Capillary: 97 mg/dL (ref 70–99)

## 2018-08-20 LAB — VITAMIN B12: Vitamin B-12: 514 pg/mL (ref 180–914)

## 2018-08-20 LAB — RETICULOCYTES
Immature Retic Fract: 9.7 % (ref 2.3–15.9)
RBC.: 3.38 MIL/uL — ABNORMAL LOW (ref 4.22–5.81)
Retic Count, Absolute: 45 10*3/uL (ref 19.0–186.0)
Retic Ct Pct: 1.3 % (ref 0.4–3.1)

## 2018-08-20 LAB — CBC
HEMATOCRIT: 31.4 % — AB (ref 39.0–52.0)
Hemoglobin: 10.3 g/dL — ABNORMAL LOW (ref 13.0–17.0)
MCH: 30.5 pg (ref 26.0–34.0)
MCHC: 32.8 g/dL (ref 30.0–36.0)
MCV: 92.9 fL (ref 80.0–100.0)
NRBC: 0 % (ref 0.0–0.2)
Platelets: 276 10*3/uL (ref 150–400)
RBC: 3.38 MIL/uL — ABNORMAL LOW (ref 4.22–5.81)
RDW: 13.6 % (ref 11.5–15.5)
WBC: 14.6 10*3/uL — ABNORMAL HIGH (ref 4.0–10.5)

## 2018-08-20 LAB — IRON AND TIBC
Iron: 17 ug/dL — ABNORMAL LOW (ref 45–182)
Saturation Ratios: 9 % — ABNORMAL LOW (ref 17.9–39.5)
TIBC: 180 ug/dL — AB (ref 250–450)
UIBC: 163 ug/dL

## 2018-08-20 LAB — FERRITIN: Ferritin: 196 ng/mL (ref 24–336)

## 2018-08-20 MED ORDER — AMOXICILLIN 500 MG PO CAPS: 500.0000 mg | ORAL_CAPSULE | Freq: Three times a day (TID) | ORAL | 0 refills | Status: AC

## 2018-08-20 MED ORDER — POTASSIUM CHLORIDE CRYS ER 20 MEQ PO TBCR
40.0000 meq | EXTENDED_RELEASE_TABLET | Freq: Once | ORAL | Status: AC
  Administered 2018-08-20: 40 meq via ORAL
  Filled 2018-08-20: qty 2

## 2018-08-20 MED ORDER — AMOXICILLIN 250 MG PO CAPS: 500.0000 mg | ORAL_CAPSULE | Freq: Three times a day (TID) | ORAL | Status: DC

## 2018-08-20 NOTE — Telephone Encounter (Signed)
Pt was on TCM report admitted 08/18/18 for acute severe onset of left-sided flank pain nausea and fever at home. CT scan of the abdomen and pelvis shows left UVJ stone 7 mm with moderate hydronephrosis and hydroureter.  Patient was started on sepsis protocol. Pt had a CYSTOSCOPY/LEFT /STENT PLACEMENT procedure. Pt D/C 08/20/18, and will follow-up w/ [place] Oms, MD (Urology) in 2 weeks (09/02/2018).Marland KitchenJohny Chess

## 2018-08-20 NOTE — Discharge Summary (Signed)
Physician Discharge Summary  Derrick Walker FMB:[id number] DOB: 24-Aug-1955 DOA: 08/18/2018  PCP: Binnie Rail, MD  Admit date: 08/18/2018 Discharge date: 08/20/2018   Admitted From: Home  Discharge disposition: home   Recommendations for Outpatient Follow-Up:   1. F/u with PCP 2. F/u with urologist.   Discharge Diagnosis:   Principal Problem:   Sepsis secondary to UTI [facility]) Active Problems:   Hypertension, essential   History of prostate cancer   Diabetes ([place])   Sepsis due to gram-negative UTI [facility])    Discharge Condition: Improved.  Diet recommendation: Low sodium, heart healthy.  Carbohydrate-modified.  Wound care: None.  Code status: Full.   History of Present Illness:  Patient is a 62 y.o.malewith medical history significant oftype 2 diabetes on metformin, history of penile prosthesis and complications that was treated, hypertension who presented to the hospital with acute severe onset of left-sided flank pain nausea and fever at home.   His UA looked abnormal.  CT scan showed a left UVJ stone which was 7 mm in size along with a moderate left-sided hydronephrosis and hydroureter.  Patient was admitted to the hospital.  He was noted to be in sepsis. Urology was consulted.     Hospital Course:   Sepsis secondary to acute pyelonephritis/E. coli bacteremia/left obstructing stone Patient's blood cultures were positive for E. coli.  Patient underwent cystoscopy and a stent placement on 1/27 for obstructing stone.  Patient was initially given broad-spectrum antibiotics coverage.  In view of the E. coli in blood, he was transitioned to ceftriaxone 2 g daily.  Sepsis physiology improved.  Lactic acidosis improved.  WBC count improving. HIV was nonreactive. Patient to follow-up with urology as an outpatient for stent removal.  Based on sensitivity, will discharge the patient on oral amoxicillin for next 10 days.  I noticed that urology has also started the patient  on Ditropan, Flomax and pyridium which patient will continue.  Acute renal failure/obstructive uropathy This is secondary to the stone.  Creatinine was elevated to 1.8 on admission, peaked up to 2.16, improved with IV fluids, down to 1.35 today.  Cough Probably due to atelectasis.  Incentive spirometry.  Chest x-ray did not show any acute findings.  Hypokalemia Corrected this morning.  Normocytic anemia Drop in hemoglobin is most likely dilutional.  No evidence of overt bleeding.  Hemoglobin at 10.3 this morning.  Anemia profile checked.  Serum iron level and saturation rate low but ferritin level was normal at 196.  Probably early iron deficiency.  Low level of folic acid at 2.4.  I started the patient on oral folate and oral iron pills at the time of discharge.  History of essential hypertension Resume hydrochlorothiazide post discharge.  Diabetes mellitus type 2 He is on metformin at home which was held because of acute kidney injury.  Kidney function improved.  Resume metformin post discharge.  Avoid naproxen.  Stable for discharge to home today.  Medical Consultants:    None.   Discharge Exam:   Vitals:   08/19/18 2140 08/20/18 0416  BP: 138/73 122/64  Pulse: 77 74  Resp: 16 16  Temp: 98.4 F (36.9 C) 98.3 F (36.8 C)  SpO2: 96% 98%   Vitals:   08/19/18 1150 08/19/18 1508 08/19/18 2140 08/20/18 0416  BP:  (!) 143/67 138/73 122/64  Pulse:  81 77 74  Resp:  20 16 16   Temp: 98.4 F (36.9 C) 98.9 F (37.2 C) 98.4 F (36.9 C) 98.3 F (36.8 C)  TempSrc: Oral Oral Oral Oral  SpO2:  100% 96% 98%  Weight:      Height:        General exam: Appears calm and comfortable.  Pleasant, middle-aged African-American male.  Not in distress. Respiratory system: Clear to auscultation. Respiratory effort normal. Cardiovascular system: S1 & S2 heard, RRR. No JVD,  rubs, gallops or clicks. No murmurs. Gastrointestinal system: Abdomen is nondistended, soft and nontender.  No organomegaly or masses felt. Normal bowel sounds heard. Central nervous system: Alert and oriented. No focal neurological deficits. Extremities: No clubbing,  or cyanosis. No edema. Skin: No rashes, lesions or ulcers. Psychiatry: Judgement and insight appear normal. Mood & affect appropriate.    The results of significant diagnostics from this hospitalization (including imaging, microbiology, ancillary and laboratory) are listed below for reference.     Procedures and Diagnostic Studies:   Dg Chest Port 1 View  Result Date: 08/18/2018 CLINICAL DATA:  Sepsis. EXAM: PORTABLE CHEST 1 VIEW COMPARISON:  04/07/2014 FINDINGS: The cardiac silhouette is accentuated by low lung volumes and portable AP technique. There is mild elevation of the left hemidiaphragm with mild, heterogeneous left basilar opacity. The right lung is clear. No sizable pleural effusion or pneumothorax is identified. No acute osseous abnormality is seen. IMPRESSION: Mild left basilar atelectasis. Electronically Signed   By: Logan Bores M.D.   On: 08/18/2018 11:34   Dg C-arm 1-60 Min-no Report  Result Date: 08/18/2018 Fluoroscopy was utilized by the requesting physician.  No radiographic interpretation.   Ct Renal Stone Study  Result Date: 08/18/2018 CLINICAL DATA:  Left-sided flank pain for 2 days EXAM: CT ABDOMEN AND PELVIS WITHOUT CONTRAST TECHNIQUE: Multidetector CT imaging of the abdomen and pelvis was performed following the standard protocol without IV contrast. COMPARISON:  01/03/2017 FINDINGS: Lower chest: No acute abnormality. Hepatobiliary: No focal liver abnormality is seen. No gallstones, gallbladder wall thickening, or biliary dilatation. Pancreas: Unremarkable. No pancreatic ductal dilatation or surrounding inflammatory changes. Spleen: Normal in size without focal abnormality. Adrenals/Urinary Tract: Adrenal glands are within normal limits bilaterally. Kidneys are well visualized bilaterally. The right kidney  demonstrates no renal calculi or obstructive changes. The left kidney is enlarged with evidence of hydronephrosis and perinephric stranding. This hydronephrosis extends inferiorly to the level of the left ureterovesical junction. There is a 7-8 mm ovoid stone identified at the left UVJ. The bladder is partially distended. Stomach/Bowel: The appendix is within normal limits. No obstructive or inflammatory changes of the larger small-bowel are seen. The stomach is within normal limits. Vascular/Lymphatic: Aortic atherosclerosis. No enlarged abdominal or pelvic lymph nodes. Reproductive: Prostate has been surgically removed. Penile prosthesis is noted in place. Other: No abdominal wall hernia or abnormality. No abdominopelvic ascites. Musculoskeletal: Mild degenerative changes of lumbar spine are noted. IMPRESSION: 7-8 mm left UVJ stone with left hydronephrosis and hydroureter. No other focal abnormality is noted. Electronically Signed   By: Inez Catalina M.D.   On: 08/18/2018 10:12     Labs:   Basic Metabolic Panel: Recent Labs  Lab 08/18/18 0857 08/19/18 0319 08/20/18 0330  NA 139 138 138  K 3.2* 3.7 3.3*  CL 107 108 109  CO2 20* 23 23  GLUCOSE 154* 129* 90  BUN 14 22 19   CREATININE 1.80* 2.16* 1.35*  CALCIUM 10.5* 8.9 9.9   GFR Estimated Creatinine Clearance: 67.4 mL/min (A) (by C-G formula based on SCr of 1.35 mg/dL (H)). Liver Function Tests: Recent Labs  Lab 08/18/18 0857  AST 18  ALT 20  ALKPHOS 59  BILITOT 1.5*  PROT 7.3  ALBUMIN 3.7   Recent Labs  Lab 08/18/18 0857  LIPASE 38   No results for input(s): AMMONIA in the last 168 hours. Coagulation profile No results for input(s): INR, PROTIME in the last 168 hours.  CBC: Recent Labs  Lab 08/18/18 0857 08/19/18 0319 08/20/18 0330  WBC 17.9* 23.3* 14.6*  HGB 12.5* 9.7* 10.3*  HCT 37.3* 30.2* 31.4*  MCV 90.3 93.8 92.9  PLT 441* 318 276   Cardiac Enzymes: No results for input(s): CKTOTAL, CKMB, CKMBINDEX,  TROPONINI in the last 168 hours. BNP: Invalid input(s): POCBNP CBG: Recent Labs  Lab 08/19/18 1735 08/19/18 2137 08/20/18 0024 08/20/18 0414 08/20/18 0733  GLUCAP 124* 100* 97 75 90   D-Dimer No results for input(s): DDIMER in the last 72 hours. Hgb A1c No results for input(s): HGBA1C in the last 72 hours. Lipid Profile No results for input(s): CHOL, HDL, LDLCALC, TRIG, CHOLHDL, LDLDIRECT in the last 72 hours. Thyroid function studies No results for input(s): TSH, T4TOTAL, T3FREE, THYROIDAB in the last 72 hours.  Invalid input(s): FREET3 Anemia work up Recent Labs    08/20/18 0330  VITAMINB12 514  FOLATE 2.4*  FERRITIN 196  TIBC 180*  IRON 17*  RETICCTPCT 1.3   Microbiology Recent Results (from the past 240 hour(s))  Blood Culture (routine x 2)     Status: Abnormal   Collection Time: 08/18/18  9:47 AM  Result Value Ref Range Status   Specimen Description BLOOD RIGHT ANTECUBITAL  Final   Special Requests   Final    BOTTLES DRAWN AEROBIC AND ANAEROBIC Blood Culture adequate volume   Culture  Setup Time   Final    IN BOTH AEROBIC AND ANAEROBIC BOTTLES GRAM NEGATIVE RODS CRITICAL VALUE NOTED.  VALUE IS CONSISTENT WITH PREVIOUSLY REPORTED AND CALLED VALUE.    Culture (A)  Final    ESCHERICHIA COLI SUSCEPTIBILITIES PERFORMED ON PREVIOUS CULTURE WITHIN THE LAST 5 DAYS. Performed at [facility], [place] [address], [place], [place] [address]    Report Status 08/20/2018 FINAL  Final  Blood Culture (routine x 2)     Status: Abnormal   Collection Time: 08/18/18 10:02 AM  Result Value Ref Range Status   Specimen Description BLOOD LEFT ANTECUBITAL  Final   Special Requests   Final    BOTTLES DRAWN AEROBIC AND ANAEROBIC Blood Culture adequate volume   Culture  Setup Time   Final    IN BOTH AEROBIC AND ANAEROBIC BOTTLES GRAM NEGATIVE RODS CRITICAL RESULT CALLED TO, READ BACK BY AND VERIFIED WITH: Lavell Luster [facility] 08/18/18 2348 JDW Performed at [facility],  [place] [address], [place], [place] [address]    Culture ESCHERICHIA COLI (A)  Final   Report Status 08/20/2018 FINAL  Final   Organism ID, Bacteria ESCHERICHIA COLI  Final      Susceptibility   Escherichia coli - MIC*    AMPICILLIN <=2 SENSITIVE Sensitive     CEFAZOLIN <=4 SENSITIVE Sensitive     CEFEPIME <=1 SENSITIVE Sensitive     CEFTAZIDIME <=1 SENSITIVE Sensitive     CEFTRIAXONE <=1 SENSITIVE Sensitive     CIPROFLOXACIN >=4 RESISTANT Resistant     GENTAMICIN <=1 SENSITIVE Sensitive     IMIPENEM <=0.25 SENSITIVE Sensitive     TRIMETH/SULFA <=20 SENSITIVE Sensitive     AMPICILLIN/SULBACTAM <=2 SENSITIVE Sensitive     PIP/TAZO <=4 SENSITIVE Sensitive     Extended ESBL NEGATIVE Sensitive     *  ESCHERICHIA COLI  Blood Culture ID Panel (Reflexed)     Status: Abnormal   Collection Time: 08/18/18 10:02 AM  Result Value Ref Range Status   Enterococcus species NOT DETECTED NOT DETECTED Final   Listeria monocytogenes NOT DETECTED NOT DETECTED Final   Staphylococcus species NOT DETECTED NOT DETECTED Final   Staphylococcus aureus (BCID) NOT DETECTED NOT DETECTED Final   Streptococcus species NOT DETECTED NOT DETECTED Final   Streptococcus agalactiae NOT DETECTED NOT DETECTED Final   Streptococcus pneumoniae NOT DETECTED NOT DETECTED Final   Streptococcus pyogenes NOT DETECTED NOT DETECTED Final   Acinetobacter baumannii NOT DETECTED NOT DETECTED Final   Enterobacteriaceae species DETECTED (A) NOT DETECTED Final    Comment: Enterobacteriaceae represent a large family of gram-negative bacteria, not a single organism. CRITICAL RESULT CALLED TO, READ BACK BY AND VERIFIED WITH: J GRIMSLEY PHARMD 08/18/18 2348 JDW    Enterobacter cloacae complex NOT DETECTED NOT DETECTED Final   Escherichia coli DETECTED (A) NOT DETECTED Final    Comment: CRITICAL RESULT CALLED TO, READ BACK BY AND VERIFIED WITH: Lavell Luster [facility] 08/18/18 2348 JDW    Klebsiella oxytoca NOT DETECTED NOT DETECTED Final    Klebsiella pneumoniae NOT DETECTED NOT DETECTED Final   Proteus species NOT DETECTED NOT DETECTED Final   Serratia marcescens NOT DETECTED NOT DETECTED Final   Carbapenem resistance NOT DETECTED NOT DETECTED Final   Haemophilus influenzae NOT DETECTED NOT DETECTED Final   Neisseria meningitidis NOT DETECTED NOT DETECTED Final   Pseudomonas aeruginosa NOT DETECTED NOT DETECTED Final   Candida albicans NOT DETECTED NOT DETECTED Final   Candida glabrata NOT DETECTED NOT DETECTED Final   Candida krusei NOT DETECTED NOT DETECTED Final   Candida parapsilosis NOT DETECTED NOT DETECTED Final   Candida tropicalis NOT DETECTED NOT DETECTED Final    Comment: Performed at [facility], [place] [address] [place], [place] [address]  Urine Culture     Status: Abnormal (Preliminary result)   Collection Time: 08/18/18  8:12 PM  Result Value Ref Range Status   Specimen Description   Final    URINE, CLEAN CATCH Performed at [facility], [place] [address], [place], [place] [address]    Special Requests   Final    NONE Performed at [facility], [place] [address] [place], [place] [address]    Culture >=100,000 COLONIES/mL GRAM NEGATIVE RODS (A)  Final   Report Status PENDING  Incomplete  MRSA PCR Screening     Status: None   Collection Time: 08/18/18 11:07 PM  Result Value Ref Range Status   MRSA by PCR NEGATIVE NEGATIVE Final    Comment:        The GeneXpert MRSA Assay (FDA approved for NASAL specimens only), is one component of a comprehensive MRSA colonization surveillance program. It is not intended to diagnose MRSA infection nor to guide or monitor treatment for MRSA infections. Performed at [facility], [place] [address] [place], [place] [address]      Discharge Instructions:    Allergies as of 08/20/2018      Reactions   Shellfish Allergy Anaphylaxis   Oxycodone Hives   Able to take percocet without problems   Lisinopril  Other (See Comments)   Pt states gives him spasm in chest   Losartan Other (See Comments)   Kidney pain, leg swelling      Medication List    STOP taking these medications   naproxen sodium 220 MG tablet Commonly  known as:  ALEVE     TAKE these medications   amoxicillin 500 MG capsule Commonly known as:  AMOXIL Take 1 capsule (500 mg total) by mouth 3 (three) times daily for 10 days.   hydrochlorothiazide 25 MG tablet Commonly known as:  HYDRODIURIL Take 25 mg by mouth daily.   HYDROcodone-acetaminophen 5-325 MG tablet Commonly known as:  NORCO Take 1 tablet by mouth every 4 (four) hours as needed for moderate pain.   metFORMIN 1000 MG tablet Commonly known as:  GLUCOPHAGE Take 1,000 mg by mouth 2 (two) times daily with a meal.   ondansetron 4 MG tablet Commonly known as:  ZOFRAN Take 1 tablet (4 mg total) by mouth daily as needed for nausea or vomiting.   oxybutynin 5 MG tablet Commonly known as:  DITROPAN Take 1 tablet (5 mg total) by mouth every 8 (eight) hours as needed for bladder spasms.   phenazopyridine 200 MG tablet Commonly known as:  PYRIDIUM Take 1 tablet (200 mg total) by mouth 3 (three) times daily as needed (for pain with urination).   tamsulosin 0.4 MG Caps capsule Commonly known as:  FLOMAX Take 1 capsule (0.4 mg total) by mouth daily.      Follow-up Information    Ceasar Mons, MD In 2 weeks.   Specialty:  Urology Why:  My office will contact you to schedule your next kidney stone surgery  Contact information: [address] [place] [address] [phone]           Time coordinating discharge: 39 minutes  Signed:  Marlowe Aschoff Kamron Portee  [facility] 08/20/2018, 9:36 AM

## 2018-08-20 NOTE — Plan of Care (Signed)
  Problem: Nutrition: Goal: Adequate nutrition will be maintained Outcome: Progressing   Problem: Elimination: Goal: Will not experience complications related to bowel motility Outcome: Progressing   Problem: Safety: Goal: Ability to remain free from injury will improve Outcome: Progressing   

## 2018-08-20 NOTE — Progress Notes (Signed)
1/29/220  1100 Spoke with security. They should be ready to take patient to [facility] around 1200.

## 2018-08-20 NOTE — Progress Notes (Signed)
08/20/2018  1200 Reviewed discharge instructions with patient. Patient verbalized understanding of discharge instructions. Copy of discharge instructions given to patient.

## 2018-08-21 LAB — URINE CULTURE: Culture: >=100000 — AB

## 2018-08-26 ENCOUNTER — Other Ambulatory Visit: Payer: Self-pay | Admitting: Urology

## 2018-08-28 ENCOUNTER — Other Ambulatory Visit: Payer: Self-pay

## 2018-08-28 ENCOUNTER — Encounter (HOSPITAL_BASED_OUTPATIENT_CLINIC_OR_DEPARTMENT_OTHER): Payer: Self-pay

## 2018-08-28 NOTE — Progress Notes (Signed)
Spoke with:  Mykell NPO:  After Midnight, no gum, candy, or mints   Arrival time: 0630 AM Labs: BMP (EKG1/27/2020 chart/epic) AM medications: Tamsulosin, Oxybutynin if needed, pyridium if needed Pre op orders: Yes Ride home: Leanna Battles (girlfriend) [phone]

## 2018-09-03 ENCOUNTER — Ambulatory Visit (HOSPITAL_BASED_OUTPATIENT_CLINIC_OR_DEPARTMENT_OTHER): Payer: Medicare HMO | Admitting: Anesthesiology

## 2018-09-03 ENCOUNTER — Ambulatory Visit (HOSPITAL_BASED_OUTPATIENT_CLINIC_OR_DEPARTMENT_OTHER)
Admission: RE | Admit: 2018-09-03 | Discharge: 2018-09-03 | Disposition: A | Discharge status: Home / Self Care | Payer: Medicare HMO | Attending: Urology | Admitting: Urology

## 2018-09-03 ENCOUNTER — Encounter (HOSPITAL_BASED_OUTPATIENT_CLINIC_OR_DEPARTMENT_OTHER)
Admission: RE | Disposition: A | Discharge status: Home / Self Care | Payer: Self-pay | Source: Home / Self Care | Attending: Urology

## 2018-09-03 ENCOUNTER — Encounter (HOSPITAL_BASED_OUTPATIENT_CLINIC_OR_DEPARTMENT_OTHER): Payer: Self-pay | Admitting: *Deleted

## 2018-09-03 ENCOUNTER — Other Ambulatory Visit: Payer: Self-pay

## 2018-09-03 DIAGNOSIS — Z8249 Family history of ischemic heart disease and other diseases of the circulatory system: Secondary | ICD-10-CM | POA: Diagnosis not present

## 2018-09-03 DIAGNOSIS — E119 Type 2 diabetes mellitus without complications: Secondary | ICD-10-CM | POA: Diagnosis not present

## 2018-09-03 DIAGNOSIS — Z87442 Personal history of urinary calculi: Secondary | ICD-10-CM | POA: Diagnosis present

## 2018-09-03 DIAGNOSIS — Z841 Family history of disorders of kidney and ureter: Secondary | ICD-10-CM | POA: Diagnosis not present

## 2018-09-03 DIAGNOSIS — Z6830 Body mass index (BMI) 30.0-30.9, adult: Secondary | ICD-10-CM | POA: Diagnosis not present

## 2018-09-03 DIAGNOSIS — N132 Hydronephrosis with renal and ureteral calculous obstruction: Secondary | ICD-10-CM | POA: Insufficient documentation

## 2018-09-03 DIAGNOSIS — Z91013 Allergy to seafood: Secondary | ICD-10-CM | POA: Insufficient documentation

## 2018-09-03 DIAGNOSIS — I10 Essential (primary) hypertension: Secondary | ICD-10-CM | POA: Insufficient documentation

## 2018-09-03 DIAGNOSIS — Z8546 Personal history of malignant neoplasm of prostate: Secondary | ICD-10-CM | POA: Diagnosis not present

## 2018-09-03 DIAGNOSIS — Z888 Allergy status to other drugs, medicaments and biological substances status: Secondary | ICD-10-CM | POA: Diagnosis not present

## 2018-09-03 DIAGNOSIS — Z885 Allergy status to narcotic agent status: Secondary | ICD-10-CM | POA: Insufficient documentation

## 2018-09-03 DIAGNOSIS — E669 Obesity, unspecified: Secondary | ICD-10-CM | POA: Diagnosis not present

## 2018-09-03 DIAGNOSIS — N201 Calculus of ureter: Secondary | ICD-10-CM | POA: Diagnosis not present

## 2018-09-03 HISTORY — DX: Sepsis due to Escherichia coli (e. coli): A41.51

## 2018-09-03 HISTORY — PX: CYSTOSCOPY/URETEROSCOPY/HOLMIUM LASER/STENT PLACEMENT: SHX6546

## 2018-09-03 HISTORY — DX: Obesity, unspecified: E66.9

## 2018-09-03 HISTORY — DX: Heart disease, unspecified: I51.9

## 2018-09-03 HISTORY — DX: Spinal stenosis, lumbosacral region: M48.07

## 2018-09-03 HISTORY — DX: Personal history of urinary calculi: Z87.442

## 2018-09-03 HISTORY — DX: Unspecified hydronephrosis: N13.30

## 2018-09-03 HISTORY — DX: Cardiomegaly: I51.7

## 2018-09-03 LAB — BASIC METABOLIC PANEL
Anion gap: 9 (ref 5–15)
BUN: 13 mg/dL (ref 8–23)
CO2: 24 mmol/L (ref 22–32)
Calcium: 9.9 mg/dL (ref 8.9–10.3)
Chloride: 106 mmol/L (ref 98–111)
Creatinine, Ser: 1.08 mg/dL (ref 0.61–1.24)
GFR calc Af Amer: >60 mL/min (ref >60)
GFR calc non Af Amer: >60 mL/min (ref >60)
Glucose, Bld: 115 mg/dL — ABNORMAL HIGH (ref 70–99)
POTASSIUM: 3.5 mmol/L (ref 3.5–5.1)
Sodium: 139 mmol/L (ref 135–145)

## 2018-09-03 LAB — GLUCOSE, CAPILLARY: Glucose-Capillary: 97 mg/dL (ref 70–99)

## 2018-09-03 SURGERY — CYSTOSCOPY/URETEROSCOPY/HOLMIUM LASER/STENT PLACEMENT
Anesthesia: General | Site: Ureter | Laterality: Left

## 2018-09-03 MED ORDER — DEXAMETHASONE SODIUM PHOSPHATE 4 MG/ML IJ SOLN
INTRAMUSCULAR | Status: DC | PRN
  Administered 2018-09-03: 10 mg via INTRAVENOUS

## 2018-09-03 MED ORDER — PROMETHAZINE HCL 25 MG/ML IJ SOLN
6.2500 mg | INTRAMUSCULAR | Status: DC | PRN
  Filled 2018-09-03: qty 1

## 2018-09-03 MED ORDER — ONDANSETRON HCL 4 MG/2ML IJ SOLN
INTRAMUSCULAR | Status: AC
  Filled 2018-09-03: qty 2

## 2018-09-03 MED ORDER — SODIUM CHLORIDE 0.9 % IR SOLN
Status: DC | PRN
  Administered 2018-09-03: 3000 mL

## 2018-09-03 MED ORDER — CEFTRIAXONE SODIUM 2 G IJ SOLR
INTRAMUSCULAR | Status: AC
  Filled 2018-09-03: qty 20

## 2018-09-03 MED ORDER — CEFAZOLIN SODIUM-DEXTROSE 1-4 GM/50ML-% IV SOLN
INTRAVENOUS | Status: AC
  Filled 2018-09-03: qty 50

## 2018-09-03 MED ORDER — SCOPOLAMINE 1 MG/3DAYS TD PT72
MEDICATED_PATCH | TRANSDERMAL | Status: AC
  Filled 2018-09-03: qty 1

## 2018-09-03 MED ORDER — ACETAMINOPHEN 500 MG PO TABS
ORAL_TABLET | ORAL | Status: AC
  Filled 2018-09-03: qty 2

## 2018-09-03 MED ORDER — FENTANYL CITRATE (PF) 100 MCG/2ML IJ SOLN
25.0000 ug | INTRAMUSCULAR | Status: DC | PRN
  Filled 2018-09-03: qty 1

## 2018-09-03 MED ORDER — SODIUM CHLORIDE 0.9 % IV SOLN
INTRAVENOUS | Status: AC
  Filled 2018-09-03: qty 100

## 2018-09-03 MED ORDER — ONDANSETRON HCL 4 MG/2ML IJ SOLN
INTRAMUSCULAR | Status: DC | PRN
  Administered 2018-09-03: 4 mg via INTRAVENOUS

## 2018-09-03 MED ORDER — LIDOCAINE 2% (20 MG/ML) 5 ML SYRINGE
INTRAMUSCULAR | Status: AC
  Filled 2018-09-03: qty 5

## 2018-09-03 MED ORDER — FENTANYL CITRATE (PF) 100 MCG/2ML IJ SOLN
INTRAMUSCULAR | Status: AC
  Filled 2018-09-03: qty 2

## 2018-09-03 MED ORDER — SODIUM CHLORIDE 0.9 % IV SOLN
INTRAVENOUS | Status: DC
  Administered 2018-09-03: 07:00:00 via INTRAVENOUS
  Filled 2018-09-03: qty 1000

## 2018-09-03 MED ORDER — LIDOCAINE 2% (20 MG/ML) 5 ML SYRINGE
INTRAMUSCULAR | Status: DC | PRN
  Administered 2018-09-03: 100 mg via INTRAVENOUS

## 2018-09-03 MED ORDER — SCOPOLAMINE 1 MG/3DAYS TD PT72
1.0000 | MEDICATED_PATCH | TRANSDERMAL | Status: DC
  Administered 2018-09-03: 1.5 mg via TRANSDERMAL
  Filled 2018-09-03: qty 1

## 2018-09-03 MED ORDER — ACETAMINOPHEN 500 MG PO TABS
1000.0000 mg | ORAL_TABLET | Freq: Once | ORAL | Status: AC
  Administered 2018-09-03: 1000 mg via ORAL
  Filled 2018-09-03: qty 2

## 2018-09-03 MED ORDER — KETOROLAC TROMETHAMINE 30 MG/ML IJ SOLN
INTRAMUSCULAR | Status: AC
  Filled 2018-09-03: qty 1

## 2018-09-03 MED ORDER — SODIUM CHLORIDE 0.9 % IV SOLN
2.0000 g | Freq: Once | INTRAVENOUS | Status: AC
  Administered 2018-09-03: 2 g via INTRAVENOUS
  Filled 2018-09-03: qty 20

## 2018-09-03 MED ORDER — PROPOFOL 10 MG/ML IV BOLUS
INTRAVENOUS | Status: AC
  Filled 2018-09-03: qty 40

## 2018-09-03 MED ORDER — MIDAZOLAM HCL 2 MG/2ML IJ SOLN
INTRAMUSCULAR | Status: AC
  Filled 2018-09-03: qty 2

## 2018-09-03 MED ORDER — MIDAZOLAM HCL 5 MG/5ML IJ SOLN
INTRAMUSCULAR | Status: DC | PRN
  Administered 2018-09-03: 2 mg via INTRAVENOUS

## 2018-09-03 MED ORDER — KETOROLAC TROMETHAMINE 30 MG/ML IJ SOLN
INTRAMUSCULAR | Status: DC | PRN
  Administered 2018-09-03: 30 mg via INTRAVENOUS

## 2018-09-03 MED ORDER — PROPOFOL 10 MG/ML IV BOLUS
INTRAVENOUS | Status: DC | PRN
  Administered 2018-09-03: 200 mg via INTRAVENOUS

## 2018-09-03 MED ORDER — ARTIFICIAL TEARS OPHTHALMIC OINT
TOPICAL_OINTMENT | OPHTHALMIC | Status: AC
  Filled 2018-09-03: qty 3.5

## 2018-09-03 MED ORDER — KETOROLAC TROMETHAMINE 10 MG PO TABS: 10.0000 mg | ORAL_TABLET | Freq: Four times a day (QID) | ORAL | 0 refills | Status: DC | PRN

## 2018-09-03 MED ORDER — DEXAMETHASONE SODIUM PHOSPHATE 10 MG/ML IJ SOLN
INTRAMUSCULAR | Status: AC
  Filled 2018-09-03: qty 1

## 2018-09-03 MED ORDER — FENTANYL CITRATE (PF) 100 MCG/2ML IJ SOLN
INTRAMUSCULAR | Status: DC | PRN
  Administered 2018-09-03 (×2): 50 ug via INTRAVENOUS

## 2018-09-03 MED ORDER — CEPHALEXIN 500 MG PO CAPS: 500.0000 mg | ORAL_CAPSULE | Freq: Two times a day (BID) | ORAL | 0 refills | Status: AC

## 2018-09-03 SURGICAL SUPPLY — 28 items
BAG DRAIN URO-CYSTO SKYTR STRL (DRAIN) ×2 IMPLANT
BASKET STONE 1.7 NGAGE (UROLOGICAL SUPPLIES) IMPLANT
BASKET ZERO TIP NITINOL 2.4FR (BASKET) ×2 IMPLANT
BENZOIN TINCTURE PRP APPL 2/3 (GAUZE/BANDAGES/DRESSINGS) ×2 IMPLANT
CATH URET 5FR 28IN OPEN ENDED (CATHETERS) ×2 IMPLANT
CLOTH BEACON ORANGE TIMEOUT ST (SAFETY) ×2 IMPLANT
FIBER LASER FLEXIVA 365 (UROLOGICAL SUPPLIES) ×2 IMPLANT
FIBER LASER TRAC TIP (UROLOGICAL SUPPLIES) IMPLANT
GLOVE BIO SURGEON STRL SZ 6.5 (GLOVE) ×2 IMPLANT
GLOVE BIO SURGEON STRL SZ7.5 (GLOVE) ×2 IMPLANT
GLOVE BIOGEL PI IND STRL 6.5 (GLOVE) ×1 IMPLANT
GLOVE BIOGEL PI INDICATOR 6.5 (GLOVE) ×1
GOWN STRL REUS W/TWL XL LVL3 (GOWN DISPOSABLE) ×4 IMPLANT
GUIDEWIRE STR DUAL SENSOR (WIRE) IMPLANT
GUIDEWIRE ZIPWRE .038 STRAIGHT (WIRE) ×2 IMPLANT
IV NS 1000ML (IV SOLUTION)
IV NS 1000ML BAXH (IV SOLUTION) IMPLANT
IV NS IRRIG 3000ML ARTHROMATIC (IV SOLUTION) ×2 IMPLANT
KIT TURNOVER CYSTO (KITS) ×2 IMPLANT
MANIFOLD NEPTUNE II (INSTRUMENTS) ×2 IMPLANT
NS IRRIG 500ML POUR BTL (IV SOLUTION) IMPLANT
PACK CYSTO (CUSTOM PROCEDURE TRAY) ×2 IMPLANT
STENT URET 6FRX26 CONTOUR (STENTS) ×2 IMPLANT
STRIP CLOSURE SKIN 1/2X4 (GAUZE/BANDAGES/DRESSINGS) ×2 IMPLANT
SYR 10ML LL (SYRINGE) ×2 IMPLANT
TUBE CONNECTING 12X1/4 (SUCTIONS) IMPLANT
TUBING UROLOGY SET (TUBING) ×2 IMPLANT
WATER STERILE IRR 1000ML POUR (IV SOLUTION) ×2 IMPLANT

## 2018-09-03 NOTE — Anesthesia Preprocedure Evaluation (Addendum)
Anesthesia Evaluation  Patient identified by MRN, date of birth, ID band  Reviewed: Allergy & Precautions, Patient's Chart, lab work & pertinent test results  History of Anesthesia Complications Negative for: history of anesthetic complications  Airway Mallampati: II  TM Distance: >3 FB     Dental   Pulmonary neg pulmonary ROS,    breath sounds clear to auscultation       Cardiovascular hypertension, Pt. on medications  Rhythm:Regular Rate:Normal     Neuro/Psych negative neurological ROS     GI/Hepatic negative GI ROS, Neg liver ROS,   Endo/Other  diabetes, Type 2  Renal/GU      Musculoskeletal   Abdominal   Peds  Hematology   Anesthesia Other Findings   Reproductive/Obstetrics                            Anesthesia Physical  Anesthesia Plan  ASA: II  Anesthesia Plan: General   Post-op Pain Management:    Induction: Intravenous  PONV Risk Score and Plan: 2 and Ondansetron, Dexamethasone and Scopolamine patch - Pre-op  Airway Management Planned: LMA  Additional Equipment:   Intra-op Plan:   Post-operative Plan: Extubation in OR  Informed Consent:   Plan Discussed with:   Anesthesia Plan Comments:         Anesthesia Quick Evaluation

## 2018-09-03 NOTE — H&P (Signed)
Urology Preoperative H&P   Chief Complaint: Left ureteral stone  History of Present Illness: Derrick Walker is a 63 y.o. male s/p left JJ stent placement on 08/18/18 due to urosepsis with E. Coli bacteremia from an obstructing 8 mm left UVJ stone.  He is here today for definitive stone treatment via ureteroscopy.  In the interim, he reports intermittent left sided flank pain and bladder spasms that he attributes to his ureteral stent.  He denies interval febrile episodes or nausea/vomiting and states that he is voiding w/o difficulty.      Past Medical History:  Diagnosis Date  . Erectile dysfunction   . Grade I diastolic dysfunction 92/42/6834   Noted on ECHO  . History of kidney stones 08/18/2018   7-8 mm left UVJ stone with left hydronephrosis and hydroureter.  Marland Kitchen History of prostate cancer urologist-  dr Orpah Melter   dx 05/ 2015 -- moderate risk Stage T2c, Gleason 3+4,  vol 152.41cc---  09/ 2015 s/p  radical prostatectomy -- per pt last PSA 07-15-2016  undectable  . Hydronephrosis 08/18/2018   7-8 mm left UVJ stone with left hydronephrosis and hydroureter.  . Hypertension, essential   . LVH (left ventricular hypertrophy) 10/14/2013   Mild, Noted on ECHO  . Obese   . Right inguinal hernia    from penile prosthesis reservior post surgery 11-19-2016  . Right inguinal pain 01/03/2017  . Sepsis due to Escherichia coli ([place]) 07/2018   urosepsis and E. Coli bacteremia   . Spinal stenosis, lumbosacral region 03/30/2011  . Type 2 diabetes mellitus ([place])     Past Surgical History:  Procedure Laterality Date  . CYSTOSCOPY/URETEROSCOPY/HOLMIUM LASER/STENT PLACEMENT Left 08/18/2018   Procedure: CYSTOSCOPY/LEFT /STENT PLACEMENT;  Surgeon: Ceasar Mons, MD;  Location: [facility];  Service: Urology;  Laterality: Left;  . INGUINAL HERNIA REPAIR Right 01/14/2017   Procedure: HERNIA REPAIR INGUINAL ADULT;  Surgeon: Cleon Gustin, MD;  Location: [facility];   Service: Urology;  Laterality: Right;  . LYMPHADENECTOMY Bilateral 04/09/2014   Procedure: BILATERAL PELVIC LYMPH NODE DISSECTION;  Surgeon: Alexis Frock, MD;  Location: [facility];  Service: Urology;  Laterality: Bilateral;  . PENILE PROSTHESIS IMPLANT N/A 11/19/2016   Procedure: AMS PENILE PROTHESIS INFLATABLE;  Surgeon: Cleon Gustin, MD;  [facility];  Service: Urology;  Laterality: N/A;  . PENILE PROSTHESIS IMPLANT Right 01/14/2017   Procedure: REVISION PENILE PROSTHESIS;  Surgeon: Cleon Gustin, MD;  [facility]  Service: Urology;  Laterality: Right;  . PENILE PROSTHESIS IMPLANT N/A 02/21/2017   Procedure: PENILE PROSTHESIS REVISION;  Surgeon: Cleon Gustin, MD;  Location[facility];  Service: Urology;  Laterality: N/A;  . PROSTATE BIOPSY N/A 12/11/2013   Procedure: BIOPSY TRANSRECTAL ULTRASONIC PROSTATE (TUBP);  Surgeon: Arvil Persons, MD;  Location: [facility];  Service: Urology;  Laterality: N/A;  . ROBOT ASSISTED LAPAROSCOPIC RADICAL PROSTATECTOMY N/A 04/09/2014   Procedure: ROBOTIC ASSISTED LAPAROSCOPIC RADICAL PROSTATECTOMY, INDOCYANINE GREEN DYE ;  Surgeon: Alexis Frock, MD;  Location: [facility];  Service: Urology;  Laterality: N/A;  . TRANSTHORACIC ECHOCARDIOGRAM  10-14-2013   dr Shanon Brow harding   mild LVH/  ef  19-62%/  grade I diastolic dysfunction/  mild LAE    Allergies:  Allergies  Allergen Reactions  . Shellfish Allergy Anaphylaxis  . Oxycodone Hives    Able to take percocet without problems  . Lisinopril Other (See Comments)    Pt states  gives him spasm in chest  . Losartan Other (See Comments)    Kidney pain, leg swelling    Family History  Problem Relation Age of Onset  . Hypertension Mother   . Hypertension Maternal Grandmother   . Hypertension Paternal Grandmother   . Kidney failure Father   . Hypertension Brother   . Colon cancer Neg Hx   . Colon polyps Neg Hx   .  Esophageal cancer Neg Hx   . Rectal cancer Neg Hx   . Stomach cancer Neg Hx     Social History:  reports that he has never smoked. He has never used smokeless tobacco. He reports that he does not drink alcohol or use drugs.  ROS: A complete review of systems was performed.  All systems are negative except for pertinent findings as noted.  Physical Exam:  Vital signs in last 24 hours: Temp:  [98.6 F (37 C)] 98.6 F (37 C) (02/12 0623) Pulse Rate:  [71] 71 (02/12 0623) Resp:  [17] 17 (02/12 0623) BP: (158)/(81) 158/81 (02/12 0623) SpO2:  [99 %] 99 % (02/12 3762) Constitutional:  Alert and oriented, No acute distress Cardiovascular: Regular rate and rhythm, No JVD Respiratory: Normal respiratory effort, Lungs clear bilaterally GI: Abdomen is soft, nontender, nondistended, no abdominal masses GU: No CVA tenderness Lymphatic: No lymphadenopathy Neurologic: Grossly intact, no focal deficits Psychiatric: Normal mood and affect  Laboratory Data:  No results for input(s): WBC, HGB, HCT, PLT in the last 72 hours.  No results for input(s): NA, K, CL, GLUCOSE, BUN, CALCIUM, CREATININE in the last 72 hours.  Invalid input(s): CO3   No results found for this or any previous visit (from the past 24 hour(s)). No results found for this or any previous visit (from the past 240 hour(s)).  Renal Function: No results for input(s): CREATININE in the last 168 hours. Estimated Creatinine Clearance: 67.4 mL/min (A) (by C-G formula based on SCr of 1.35 mg/dL (H)).  Radiologic Imaging: No results found.  I independently reviewed the above imaging studies.  Assessment and Plan Derrick Walker is a 63 y.o. male with an obstructing 8 mm left UVJ stone  The risks, benefits and alternatives of cystoscopy with LEFT ureteroscopy, laser lithotripsy and ureteral stent placement was discussed the patient.  Risks included, but are not limited to: bleeding, urinary tract infection, ureteral  injury/avulsion, ureteral stricture formation, retained stone fragments, the possibility that multiple surgeries may be required to treat the stone(s), MI, stroke, PE and the inherent risks of general anesthesia.  The patient voices understanding and wishes to proceed.        Ellison Hughs, MD 09/03/2018, 6:28 AM  [facility] Pager: [phone]

## 2018-09-03 NOTE — Op Note (Signed)
Operative Note  Preoperative diagnosis:  1.  8 mm left UVJ calculus  Postoperative diagnosis: 1.  Same  Procedure(s): 1.  Cystoscopy with left ureteroscopy, holmium laser lithotripsy and left JJ stent placement 2.  Left JJ stent exchange  Surgeon: Ellison Hughs, MD  Assistants:  None  Anesthesia:  General  Complications:  None  EBL: Less than 5 mL  Specimens: 1.  Previously placed left JJ stent was removed intact, inspected and discarded 2.  Left ureteral calculus  Drains/Catheters: 1.  Left 6 French by 26 cm JJ stent with tether  Intraoperative findings:   1. 8 mm left distal ureteral calculus  Indication:  Derrick Walker is a 63 y.o. male with an obstructing 8 mm left ureteral calculus.  The patient was initially seen in consultation on 08/18/2018 due to urosepsis and E. coli bacteremia from his obstructing stone.  He is here today for definitive stone treatment.  He has been consented for the above procedures, voices understanding and wishes to proceed.  Description of procedure:  After informed consent was obtained, the patient was brought to the operating room and general LMA anesthesia was administered. The patient was then placed in the dorsolithotomy position and prepped and draped in the usual sterile fashion. A timeout was performed. A 23 French rigid cystoscope was then inserted into the urethral meatus and advanced into the bladder under direct vision. A complete bladder survey revealed no intravesical pathology.  His previously placed left JJ stent was then grasped and retracted to the urethral meatus.  A zip wire was then advanced through the lumen of the stent and up to the left renal pelvis, confirming placement via fluoroscopy.  The stent was then removed intact, inspected and discarded.  The semirigid ureteroscope was then advanced into the distal aspects of the left ureter, immediately identifying his obstructing stone.  A 365 m holmium laser was then  used to fracture the stone into several smaller pieces.  A 0 tip basket was then used to extract all stone fragments from the lumen of the left ureter.  The cystoscope was then reinserted over the wire and a 6 [place] by 26 cm JJ stent was then placed over the wire and into good position within the left collecting system, confirming placement via fluoroscopy.  The tether of the stent was left intact and secured to the dorsum of the penis with Steri-Strips and Mastisol.  The patient's bladder was then drained and all stone fragments were removed.  He tolerated the procedure well and was transferred to the postanesthesia in stable condition.  Plan: Patient has been instructed to remove his stent at 6 AM on 09/08/2018.  Follow-up in 6 weeks with left renal ultrasound

## 2018-09-03 NOTE — Anesthesia Postprocedure Evaluation (Signed)
Anesthesia Post Note  Patient: Derrick Walker  Procedure(s) Performed: CYSTOSCOPY/URETEROSCOPY/HOLMIUM LASER/STENT PLACEMENT (Left Ureter)     Patient location during evaluation: PACU Anesthesia Type: General Level of consciousness: awake Pain management: pain level controlled Vital Signs Assessment: post-procedure vital signs reviewed and stable Respiratory status: spontaneous breathing Cardiovascular status: stable Postop Assessment: no apparent nausea or vomiting Anesthetic complications: no    Last Vitals:  Vitals:   09/03/18 1000 09/03/18 1045  BP: (!) 146/76 (!) 150/82  Pulse: 72 (!) 55  Resp: (!) 22 18  Temp:    SpO2: 96% 100%    Last Pain:  Vitals:   09/03/18 1000  TempSrc:   PainSc: 0-No pain                 Sherol Sabas

## 2018-09-03 NOTE — Progress Notes (Signed)
Patient states he is still taking Amoxicillin that was given prior to surgery.  Kephlex ordered for post-op antibiotic.  Called Dr Lovena Neighbours for clarification.  Patient to continue Amoxicillin as prescribed.  Instructed not to fill or take Keflex prescription.

## 2018-09-03 NOTE — Discharge Instructions (Signed)

## 2018-09-03 NOTE — Transfer of Care (Signed)
Last Vitals:  Vitals Value Taken Time  BP 145/74 09/03/2018  9:31 AM  Temp    Pulse 71 09/03/2018  9:36 AM  Resp 18 09/03/2018  9:36 AM  SpO2 99 % 09/03/2018  9:36 AM  Vitals shown include unvalidated device data.  Last Pain:  Vitals:   09/03/18 7282  TempSrc: Oral         Immediate Anesthesia Transfer of Care Note  Patient: Derrick Walker  Procedure(s) Performed: Procedure(s) (LRB): CYSTOSCOPY/URETEROSCOPY/HOLMIUM LASER/STENT PLACEMENT (Left)  Patient Location: PACU  Anesthesia Type: General  Level of Consciousness: awake, alert  and oriented  Airway & Oxygen Therapy: Patient Spontanous Breathing and Patient connected to nasal cannula oxygen  Post-op Assessment: Report given to PACU RN and Post -op Vital signs reviewed and stable  Post vital signs: Reviewed and stable  Complications: No apparent anesthesia complications

## 2018-09-03 NOTE — Anesthesia Procedure Notes (Signed)
Procedure Name: LMA Insertion Date/Time: 09/03/2018 8:41 AM Performed by: Duane Boston, MD Pre-anesthesia Checklist: Patient identified, Emergency Drugs available, Suction available and Patient being monitored Patient Re-evaluated:Patient Re-evaluated prior to induction Oxygen Delivery Method: Circle system utilized Preoxygenation: Pre-oxygenation with 100% oxygen Induction Type: IV induction Ventilation: Mask ventilation without difficulty LMA: LMA inserted LMA Size: 5.0 Number of attempts: 1 Airway Equipment and Method: Bite block Placement Confirmation: positive ETCO2 Tube secured with: Tape Dental Injury: Teeth and Oropharynx as per pre-operative assessment

## 2018-09-04 ENCOUNTER — Encounter (HOSPITAL_BASED_OUTPATIENT_CLINIC_OR_DEPARTMENT_OTHER): Payer: Self-pay | Admitting: Urology

## 2018-09-04 NOTE — Anesthesia Postprocedure Evaluation (Signed)
Anesthesia Post Note  Patient: Derrick Walker  Procedure(s) Performed: CYSTOSCOPY/URETEROSCOPY/HOLMIUM LASER/STENT PLACEMENT (Left Ureter)     Patient location during evaluation: PACU Anesthesia Type: General Level of consciousness: awake Pain management: pain level controlled Vital Signs Assessment: post-procedure vital signs reviewed and stable Respiratory status: spontaneous breathing Cardiovascular status: stable Postop Assessment: no apparent nausea or vomiting Anesthetic complications: no    Last Vitals:  Vitals:   09/03/18 1000 09/03/18 1045  BP: (!) 146/76 (!) 150/82  Pulse: 72 (!) 55  Resp: (!) 22 18  Temp:    SpO2: 96% 100%    Last Pain:  Vitals:   09/03/18 1000  TempSrc:   PainSc: 0-No pain                 Timiko Offutt

## 2018-09-16 ENCOUNTER — Other Ambulatory Visit: Payer: Self-pay | Admitting: *Deleted

## 2018-09-16 NOTE — Patient Outreach (Signed)
[facility]) Care Management  09/16/2018  PREM COYKENDALL September 10, 1955 [id number]   Telephone Screen  Referral Date: 09/12/2018 Referral Source: [facility] Referral per Kingsley Callander Referral Reason: 08/20/18 and 09/04/18. Please outreach and determine if there are any needs for transition.   Dx Notes  S/p kidney and ureter stones, history of Diabetes Insurance: [employer] attempt # 1 No answer. [facility] RN CM left HIPAA compliant voicemail message along with CM's contact info.   Shortly after CM left a voice message Mr Mells returned a call Patient returned a call to [facility] RN CM Patient is able to verify HIPAA Reviewed and addressed referral to [facility] with patient  Mr Zerkle reports he is recovering well  He reports appreciation for the services of Dr Gilford Rile, urologist  He voiced interest in management and prevention of further kidney stones with use of dietary changes like low protein diet. He reports he presently eats a Manufacturing systems engineer diet and drinks lots of water. With review of some of the kidney stone diet information, he identified concerns with sodium and calcium intake that he wants to make changes in.  CM discussed protein intake like poultry, seafood, beans eggs plus a balance of calcium diary foods.     He voices concern with not being able to exercise as he has previous related to a wt restriction at this time. He likes to go to the gym and scuba dive. He presently is walking in the pool He was encouraged to try low impact, passive exercise like yoga and walking also until he is no longer on a weight restriction   He and Cm discussed his concern for possible correlation of his DM and HTN medications to his kidney stones. Metformin and HCTZ were discussed He was referred also to his medical providers to discuss his medication list related to kidney stones He has finished his antibiotics and other urology medicines at this time like ditropan and pyridium     Diabetes is managed as his cbg values are 92-97 Last A1c at 6.2  Cm and Mr Glantz discussed the decrease use of opiates to prevent constipation  He has dispose of he opiates   He was sent via the confirmed e mail in [place], [place] education on the kidney stone diet, low protein diet, low salt diet and kidney stones in adults  Cm reviewed [facility] services with him   Discussed his BP and he confirms he has not been checking his BP  He and DM discussed BP cuffs and low discount retail cost programs He states he will obtain a BP cuff and start taking his BP to monitor it   Social: Mr Calica is single jewish male who recently had visits to ED and hospital for sepsis and kidney stone removal He is independent with his care and denies concerns with transportation to medical appointments  Conditions: s/p kidney and ureter stones, hx of DM type 2, A1c on 01/22/18 was 6.2 HTN, LVH, mild obesity hx of prostate cancer, erectile dysfunction, hx of sepsis   Medications: denies concerns with taking medications as prescribed, affording medications, side effects of medications and questions about medications  Appointments: July 2020 annual physical with primary care MD   Advance Directives: he has a POA and living will   Consent: [facility] RN CM reviewed [facility] services with patient. Patient gave verbal consent for services.   Plan: [facility] RN CM sent an unsuccessful outreach letter  [facility] RN CM will close case  at this time as patient has been assessed and no needs identified/needs resolved.   Pt encouraged to return a call to [facility] RN CM prn  Routed note to primary MD- to share Mr Watts's concern with medications possibly causing production of kidney stones - He is on HCTZ   Kimberly L. Lavina Hamman, RN, BSN, [place] Coordinator Office number [phone] Mobile number [phone]  Main [facility] number [phone] Fax number [phone]

## 2018-09-17 ENCOUNTER — Ambulatory Visit: Payer: Self-pay | Admitting: *Deleted

## 2018-09-19 ENCOUNTER — Other Ambulatory Visit: Payer: Self-pay | Admitting: Internal Medicine

## 2018-09-28 ENCOUNTER — Encounter: Payer: Self-pay | Admitting: Internal Medicine

## 2018-09-29 MED ORDER — EPINEPHRINE 0.3 MG/0.3ML IJ SOAJ: 0.3000 mg | Freq: Once | INTRAMUSCULAR | 2 refills | Status: AC

## 2019-01-04 DIAGNOSIS — Z1159 Encounter for screening for other viral diseases: Secondary | ICD-10-CM | POA: Diagnosis not present

## 2019-01-06 DIAGNOSIS — Z1211 Encounter for screening for malignant neoplasm of colon: Secondary | ICD-10-CM | POA: Diagnosis not present

## 2019-01-06 LAB — HM COLONOSCOPY

## 2019-02-10 ENCOUNTER — Encounter: Payer: Self-pay | Admitting: Internal Medicine

## 2019-03-04 NOTE — Patient Instructions (Addendum)
Tests ordered today. Your results will be released to [place] (or called to you) after review.  If any changes need to be made, you will be notified at that same time.  All other Health Maintenance issues reviewed.   All recommended immunizations and age-appropriate screenings are up-to-date or discussed.  No immunization administered today.   Medications reviewed and updated.  Changes include :   Start crestor 10 mg daily for your cholesterol.   Your prescription(s) have been submitted to your pharmacy. Please take as directed and contact our office if you believe you are having problem(s) with the medication(s).    Please followup in 6 months    Health Maintenance, Male Adopting a healthy lifestyle and getting preventive care are important in promoting health and wellness. Ask your health care provider about:  The right schedule for you to have regular tests and exams.  Things you can do on your own to prevent diseases and keep yourself healthy. What should I know about diet, weight, and exercise? Eat a healthy diet   Eat a diet that includes plenty of vegetables, fruits, low-fat dairy products, and lean protein.  Do not eat a lot of foods that are high in solid fats, added sugars, or sodium. Maintain a healthy weight Body mass index (BMI) is a measurement that can be used to identify possible weight problems. It estimates body fat based on height and weight. Your health care provider can help determine your BMI and help you achieve or maintain a healthy weight. Get regular exercise Get regular exercise. This is one of the most important things you can do for your health. Most adults should:  Exercise for at least 150 minutes each week. The exercise should increase your heart rate and make you sweat (moderate-intensity exercise).  Do strengthening exercises at least twice a week. This is in addition to the moderate-intensity exercise.  Spend less time sitting. Even light  physical activity can be beneficial. Watch cholesterol and blood lipids Have your blood tested for lipids and cholesterol at 62 years of age, then have this test every 5 years. You may need to have your cholesterol levels checked more often if:  Your lipid or cholesterol levels are high.  You are older than 62 years of age.  You are at high risk for heart disease. What should I know about cancer screening? Many types of cancers can be detected early and may often be prevented. Depending on your health history and family history, you may need to have cancer screening at various ages. This may include screening for:  Colorectal cancer.  Prostate cancer.  Skin cancer.  Lung cancer. What should I know about heart disease, diabetes, and high blood pressure? Blood pressure and heart disease  High blood pressure causes heart disease and increases the risk of stroke. This is more likely to develop in people who have high blood pressure readings, are of African descent, or are overweight.  Talk with your health care provider about your target blood pressure readings.  Have your blood pressure checked: ? Every 3-5 years if you are 68-53 years of age. ? Every year if you are 63 years old or older.  If you are between the ages of 16 and 76 and are a current or former smoker, ask your health care provider if you should have a one-time screening for abdominal aortic aneurysm (AAA). Diabetes Have regular diabetes screenings. This checks your fasting blood sugar level. Have the screening done:  Once every  three years after age 56 if you are at a normal weight and have a low risk for diabetes.  More often and at a younger age if you are overweight or have a high risk for diabetes. What should I know about preventing infection? Hepatitis B If you have a higher risk for hepatitis B, you should be screened for this virus. Talk with your health care provider to find out if you are at risk for  hepatitis B infection. Hepatitis C Blood testing is recommended for:  Everyone born from [birth year] through 1964.  Anyone with known risk factors for hepatitis C. Sexually transmitted infections (STIs)  You should be screened each year for STIs, including gonorrhea and chlamydia, if: ? You are sexually active and are younger than 62 years of age. ? You are older than 62 years of age and your health care provider tells you that you are at risk for this type of infection. ? Your sexual activity has changed since you were last screened, and you are at increased risk for chlamydia or gonorrhea. Ask your health care provider if you are at risk.  Ask your health care provider about whether you are at high risk for HIV. Your health care provider may recommend a prescription medicine to help prevent HIV infection. If you choose to take medicine to prevent HIV, you should first get tested for HIV. You should then be tested every 3 months for as long as you are taking the medicine. Follow these instructions at home: Lifestyle  Do not use any products that contain nicotine or tobacco, such as cigarettes, e-cigarettes, and chewing tobacco. If you need help quitting, ask your health care provider.  Do not use street drugs.  Do not share needles.  Ask your health care provider for help if you need support or information about quitting drugs. Alcohol use  Do not drink alcohol if your health care provider tells you not to drink.  If you drink alcohol: ? Limit how much you have to 0-2 drinks a day. ? Be aware of how much alcohol is in your drink. In the [place] one drink equals one 12 oz bottle of beer (355 mL), one 5 oz glass of wine (148 mL), or one 1 oz glass of hard liquor (44 mL). General instructions  Schedule regular health, dental, and eye exams.  Stay current with your vaccines.  Tell your health care provider if: ? You often feel depressed. ? You have ever been abused or do not feel safe at  home. Summary  Adopting a healthy lifestyle and getting preventive care are important in promoting health and wellness.  Follow your health care provider's instructions about healthy diet, exercising, and getting tested or screened for diseases.  Follow your health care provider's instructions on monitoring your cholesterol and blood pressure. This information is not intended to replace advice given to you by your health care provider. Make sure you discuss any questions you have with your health care provider. Document Released: 01/05/2008 Document Revised: 07/02/2018 Document Reviewed: 07/02/2018 Elsevier Patient Education  2020 [employer].

## 2019-03-04 NOTE — Progress Notes (Signed)
Subjective:    Patient ID: Derrick Walker, male    DOB: 06/12/1956, 63 y.o.   MRN: [id number]  HPI He is here for a physical exam.   He has not been exercising because he has been working more and more absorbed in his academics.  He has gained weight.  He has been eating fairly well.  He has not been checking his blood pressure.  He has been taking all of his medication.  He wants to get back on track with his health.  He does wonder what he can do to reduce plaque buildup.  Medications and allergies reviewed with patient and updated if appropriate.  Patient Active Problem List   Diagnosis Date Noted  . Hyperlipidemia 03/05/2019  . Sepsis secondary to UTI ([place]) 08/18/2018  . Erectile dysfunction 11/19/2016  . Diabetes ([place]) 12/15/2015  . History of prostate cancer 04/09/2014  . Abnormal finding on EKG 09/17/2013  . LVH (left ventricular hypertrophy) - an EKG 09/17/2013  . Hypertension, essential   . Mild obesity     Current Outpatient Medications on File Prior to Visit  Medication Sig Dispense Refill  . EPINEPHrine 0.3 mg/0.3 mL IJ SOAJ injection INJECT 0.3 MLS INTO THE MUSCLE ONCE FOR 1 DOSE    . hydrochlorothiazide (HYDRODIURIL) 25 MG tablet Take 1 tablet by mouth once daily 90 tablet 1  . metFORMIN (GLUCOPHAGE) 1000 MG tablet Take 1,000 mg by mouth 2 (two) times daily with a meal.     . oxybutynin (DITROPAN) 5 MG tablet Take 1 tablet (5 mg total) by mouth every 8 (eight) hours as needed for bladder spasms. 30 tablet 1  . phenazopyridine (PYRIDIUM) 200 MG tablet Take 1 tablet (200 mg total) by mouth 3 (three) times daily as needed (for pain with urination). 30 tablet 0  . tamsulosin (FLOMAX) 0.4 MG CAPS capsule Take 1 capsule (0.4 mg total) by mouth daily. (Patient taking differently: Take 0.4 mg by mouth every morning. ) 30 capsule 0   No current facility-administered medications on file prior to visit.     Past Medical History:  Diagnosis Date  . Erectile  dysfunction   . Grade I diastolic dysfunction 99/37/1696   Noted on ECHO  . History of kidney stones 08/18/2018   7-8 mm left UVJ stone with left hydronephrosis and hydroureter.  Marland Kitchen History of prostate cancer urologist-  dr Orpah Melter   dx 05/ 2015 -- moderate risk Stage T2c, Gleason 3+4,  vol 152.41cc---  09/ 2015 s/p  radical prostatectomy -- per pt last PSA 07-15-2016  undectable  . Hydronephrosis 08/18/2018   7-8 mm left UVJ stone with left hydronephrosis and hydroureter.  . Hypertension, essential   . LVH (left ventricular hypertrophy) 10/14/2013   Mild, Noted on ECHO  . Obese   . Right inguinal hernia    from penile prosthesis reservior post surgery 11-19-2016  . Right inguinal pain 01/03/2017  . Sepsis due to Escherichia coli ([place]) 07/2018   urosepsis and E. Coli bacteremia   . Spinal stenosis, lumbosacral region 03/30/2011  . Type 2 diabetes mellitus ([place])     Past Surgical History:  Procedure Laterality Date  . CYSTOSCOPY/URETEROSCOPY/HOLMIUM LASER/STENT PLACEMENT Left 08/18/2018   Procedure: CYSTOSCOPY/LEFT /STENT PLACEMENT;  Surgeon: Ceasar Mons, MD;  Location: [facility];  Service: Urology;  Laterality: Left;  . CYSTOSCOPY/URETEROSCOPY/HOLMIUM LASER/STENT PLACEMENT Left 09/03/2018   Procedure: CYSTOSCOPY/URETEROSCOPY/HOLMIUM LASER/STENT PLACEMENT;  Surgeon: Ceasar Mons, MD;  Location: [facility];  Service:  Urology;  Laterality: Left;  . INGUINAL HERNIA REPAIR Right 01/14/2017   Procedure: HERNIA REPAIR INGUINAL ADULT;  Surgeon: Cleon Gustin, MD;  Location: [facility];  Service: Urology;  Laterality: Right;  . LYMPHADENECTOMY Bilateral 04/09/2014   Procedure: BILATERAL PELVIC LYMPH NODE DISSECTION;  Surgeon: Alexis Frock, MD;  Location: [facility];  Service: Urology;  Laterality: Bilateral;  . PENILE PROSTHESIS IMPLANT N/A 11/19/2016   Procedure: AMS PENILE PROTHESIS INFLATABLE;  Surgeon: Cleon Gustin,  MD;  Location: [facility];  Service: Urology;  Laterality: N/A;  . PENILE PROSTHESIS IMPLANT Right 01/14/2017   Procedure: REVISION PENILE PROSTHESIS;  Surgeon: Cleon Gustin, MD;  Location: [facility];  Service: Urology;  Laterality: Right;  . PENILE PROSTHESIS IMPLANT N/A 02/21/2017   Procedure: PENILE PROSTHESIS REVISION;  Surgeon: Cleon Gustin, MD;  Location: [facility];  Service: Urology;  Laterality: N/A;  . PROSTATE BIOPSY N/A 12/11/2013   Procedure: BIOPSY TRANSRECTAL ULTRASONIC PROSTATE (TUBP);  Surgeon: Arvil Persons, MD;  Location: [facility];  Service: Urology;  Laterality: N/A;  . ROBOT ASSISTED LAPAROSCOPIC RADICAL PROSTATECTOMY N/A 04/09/2014   Procedure: ROBOTIC ASSISTED LAPAROSCOPIC RADICAL PROSTATECTOMY, INDOCYANINE GREEN DYE ;  Surgeon: Alexis Frock, MD;  Location: [facility];  Service: Urology;  Laterality: N/A;  . TRANSTHORACIC ECHOCARDIOGRAM  10-14-2013   dr Shanon Brow harding   mild LVH/  ef  99-37%/  grade I diastolic dysfunction/  mild LAE    Social History   Socioeconomic History  . Marital status: Single    Spouse name: Not on file  . Number of children: 3  . Years of education: Not on file  . Highest education level: Not on file  Occupational History    Employer: [employer]  Social Needs  . Financial resource strain: Not on file  . Food insecurity    Worry: Not on file    Inability: Not on file  . Transportation needs    Medical: Not on file    Non-medical: Not on file  Tobacco Use  . Smoking status: Never Smoker  . Smokeless tobacco: Never Used  Substance and Sexual Activity  . Alcohol use: No    Alcohol/week: 7.0 standard drinks    Types: 7 Standard drinks or equivalent per week  . Drug use: No  . Sexual activity: Not on file  Lifestyle  . Physical activity    Days per week: Not on file    Minutes per session: Not on file  . Stress: Not on file  Relationships  . Social Product manager on phone: Not on file    Gets together: Not on file    Attends religious service: Not on file    Active member of club or organization: Not on file    Attends meetings of clubs or organizations: Not on file    Relationship status: Not on file  Other Topics Concern  . Not on file  Social History Narrative   He is a divorced father of 3. 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 He has finished his Masters is now working on his PhD. He never smoked. He drinks about 3 glasses of wine with food a week. He uses to help digest food.   He is Jewish and into [employer] does not eat any pork/ham and only a small amount of beef.   Contact information:  Son: Edgel Degnan: [phone]   Daughter: Kaylon Hitz: [phone]    Family History  Problem Relation Age of Onset  . Hypertension Mother   . Hypertension Maternal Grandmother   . Hypertension Paternal Grandmother   . Kidney failure Father   . Hypertension Brother   . Colon cancer Neg Hx   . Colon polyps Neg Hx   . Esophageal cancer Neg Hx   . Rectal cancer Neg Hx   . Stomach cancer Neg Hx     Review of Systems  Constitutional: Negative for chills and fever.  Eyes: Negative for visual disturbance.  Respiratory: Negative for cough, shortness of breath and wheezing.   Cardiovascular: Positive for leg swelling (mild). Negative for chest pain and palpitations.  Gastrointestinal: Negative for abdominal pain, blood in stool, constipation, diarrhea and nausea.       No gerd  Genitourinary: Negative for dysuria and hematuria.  Musculoskeletal: Positive for arthralgias (right shoulder). Negative for back pain.  Skin: Negative for color change and rash.  Neurological: Negative for dizziness, light-headedness and headaches.  Psychiatric/Behavioral: Negative for dysphoric mood. The patient is not nervous/anxious.        Objective:   Vitals:    03/05/19 0753  BP: (!) 162/90  Pulse: 78  Temp: 97.8 F (36.6 C)  SpO2: 96%   Filed Weights   03/05/19 0753  Weight: 246 lb (111.6 kg)   Body mass index is 34.31 kg/m.  Wt Readings from Last 3 Encounters:  03/05/19 246 lb (111.6 kg)  09/03/18 219 lb 4 oz (99.5 kg)  08/18/18 208 lb (94.3 kg)     Physical Exam Constitutional: He appears well-developed and well-nourished. No distress.  HENT:  Head: Normocephalic and atraumatic.  Right Ear: External ear normal.  Left Ear: External ear normal.  Mouth/Throat: Oropharynx is clear and moist.  Normal ear canals and TM b/l  Eyes: Conjunctivae and EOM are normal.  Neck: Neck supple. No tracheal deviation present. No thyromegaly present.  No carotid bruit  Cardiovascular: Normal rate, regular rhythm, normal heart sounds and intact distal pulses.   No murmur heard. Pulmonary/Chest: Effort normal and breath sounds normal. No respiratory distress. He has no wheezes. He has no rales.  Abdominal: Soft. He exhibits no distension. There is no tenderness.  Genitourinary: deferred  Musculoskeletal: He exhibits no edema.  Lymphadenopathy:   He has no cervical adenopathy.  Skin: Skin is warm and dry. He is not diaphoretic.  Psychiatric: He has a normal mood and affect. His behavior is normal.         Assessment & Plan:   Physical exam: Screening blood work   ordered Immunizations   Up to date Colonoscopy   Done in [place] - will get report - normal Eye exams   Due - will schedule Exercise   Not regular - plans on getting a bike Weight  Advised weight loss Skin no concerns Substance abuse   none  See Problem List for Assessment and Plan of chronic medical problems.  Follow-up in 6 months

## 2019-03-05 ENCOUNTER — Other Ambulatory Visit: Payer: Self-pay

## 2019-03-05 ENCOUNTER — Other Ambulatory Visit (INDEPENDENT_AMBULATORY_CARE_PROVIDER_SITE_OTHER): Payer: Medicare HMO

## 2019-03-05 ENCOUNTER — Encounter: Payer: Self-pay | Admitting: Internal Medicine

## 2019-03-05 ENCOUNTER — Ambulatory Visit (INDEPENDENT_AMBULATORY_CARE_PROVIDER_SITE_OTHER): Payer: Medicare HMO | Admitting: Internal Medicine

## 2019-03-05 VITALS — BP 162/90 | HR 78 | Temp 97.8°F | Ht 71.0 in | Wt 246.0 lb

## 2019-03-05 DIAGNOSIS — E119 Type 2 diabetes mellitus without complications: Secondary | ICD-10-CM | POA: Diagnosis not present

## 2019-03-05 DIAGNOSIS — E669 Obesity, unspecified: Secondary | ICD-10-CM

## 2019-03-05 DIAGNOSIS — E782 Mixed hyperlipidemia: Secondary | ICD-10-CM | POA: Diagnosis not present

## 2019-03-05 DIAGNOSIS — Z0000 Encounter for general adult medical examination without abnormal findings: Secondary | ICD-10-CM

## 2019-03-05 DIAGNOSIS — I10 Essential (primary) hypertension: Secondary | ICD-10-CM

## 2019-03-05 DIAGNOSIS — E785 Hyperlipidemia, unspecified: Secondary | ICD-10-CM | POA: Insufficient documentation

## 2019-03-05 DIAGNOSIS — E1169 Type 2 diabetes mellitus with other specified complication: Secondary | ICD-10-CM | POA: Insufficient documentation

## 2019-03-05 LAB — CBC WITH DIFFERENTIAL/PLATELET
Basophils Absolute: 0 10*3/uL (ref 0.0–0.1)
Basophils Relative: 0.6 % (ref 0.0–3.0)
Eosinophils Absolute: 0.1 10*3/uL (ref 0.0–0.7)
Eosinophils Relative: 1.3 % (ref 0.0–5.0)
HCT: 42.6 % (ref 39.0–52.0)
Hemoglobin: 14.3 g/dL (ref 13.0–17.0)
Lymphocytes Relative: 33.6 % (ref 12.0–46.0)
Lymphs Abs: 2.4 10*3/uL (ref 0.7–4.0)
MCHC: 33.5 g/dL (ref 30.0–36.0)
MCV: 90.3 fl (ref 78.0–100.0)
Monocytes Absolute: 0.7 10*3/uL (ref 0.1–1.0)
Monocytes Relative: 10 % (ref 3.0–12.0)
Neutro Abs: 3.9 10*3/uL (ref 1.4–7.7)
Neutrophils Relative %: 54.5 % (ref 43.0–77.0)
Platelets: 244 10*3/uL (ref 150.0–400.0)
RBC: 4.72 Mil/uL (ref 4.22–5.81)
RDW: 13.6 % (ref 11.5–15.5)
WBC: 7.1 10*3/uL (ref 4.0–10.5)

## 2019-03-05 LAB — LIPID PANEL
Cholesterol: 171 mg/dL (ref 0–200)
HDL: 43.4 mg/dL (ref >39.00)
LDL Cholesterol: 103 mg/dL — ABNORMAL HIGH (ref 0–99)
NonHDL: 127.43
Total CHOL/HDL Ratio: 4
Triglycerides: 121 mg/dL (ref 0.0–149.0)
VLDL: 24.2 mg/dL (ref 0.0–40.0)

## 2019-03-05 LAB — COMPREHENSIVE METABOLIC PANEL
ALT: 24 U/L (ref 0–53)
AST: 16 U/L (ref 0–37)
Albumin: 4.5 g/dL (ref 3.5–5.2)
Alkaline Phosphatase: 57 U/L (ref 39–117)
BUN: 14 mg/dL (ref 6–23)
CO2: 27 mEq/L (ref 19–32)
Calcium: 11 mg/dL — ABNORMAL HIGH (ref 8.4–10.5)
Chloride: 101 mEq/L (ref 96–112)
Creatinine, Ser: 1.02 mg/dL (ref 0.40–1.50)
GFR: 89.62 mL/min (ref >60.00)
Glucose, Bld: 115 mg/dL — ABNORMAL HIGH (ref 70–99)
Potassium: 3.7 mEq/L (ref 3.5–5.1)
Sodium: 137 mEq/L (ref 135–145)
Total Bilirubin: 0.5 mg/dL (ref 0.2–1.2)
Total Protein: 7.3 g/dL (ref 6.0–8.3)

## 2019-03-05 LAB — MICROALBUMIN / CREATININE URINE RATIO
Creatinine,U: 79.5 mg/dL
Microalb Creat Ratio: 1.1 mg/g (ref 0.0–30.0)
Microalb, Ur: 0.8 mg/dL (ref 0.0–1.9)

## 2019-03-05 LAB — HEMOGLOBIN A1C: Hgb A1c MFr Bld: 7.5 % — ABNORMAL HIGH (ref 4.6–6.5)

## 2019-03-05 LAB — TSH: TSH: 2 u[IU]/mL (ref 0.35–4.50)

## 2019-03-05 MED ORDER — ROSUVASTATIN CALCIUM 10 MG PO TABS: 10.0000 mg | ORAL_TABLET | Freq: Every day | ORAL | 3 refills | Status: DC

## 2019-03-05 NOTE — Assessment & Plan Note (Signed)
Blood pressure not ideally controlled, but may be better controlled at home He will start monitoring it regularly and let me know if it is not controlled Continue current medication He will start exercising and will work on weight loss CMP

## 2019-03-05 NOTE — Assessment & Plan Note (Signed)
He has not been exercising regularly-he has been working too much and as a consequence he has gained weight Overall eating fairly well Stressed the importance of exercise and weight loss keep his sugars controlled, his blood pressure control and to avoid more medication Follow-up in 6 months

## 2019-03-05 NOTE — Assessment & Plan Note (Signed)
Check lipid panel,tsh, cmp Start Crestor 10 mg daily Regular exercise and healthy diet encouraged Stressed weight loss

## 2019-03-05 NOTE — Assessment & Plan Note (Signed)
Last A1c was controlled He is taking metformin as prescribed We will check A1c, urine microalbumin We will adjust medication if needed Stressed regular exercise to help control sugars Advised weight loss

## 2019-03-06 ENCOUNTER — Other Ambulatory Visit: Payer: Self-pay | Admitting: Internal Medicine

## 2019-03-06 ENCOUNTER — Encounter: Payer: Self-pay | Admitting: Internal Medicine

## 2019-03-06 DIAGNOSIS — E782 Mixed hyperlipidemia: Secondary | ICD-10-CM

## 2019-03-06 DIAGNOSIS — E119 Type 2 diabetes mellitus without complications: Secondary | ICD-10-CM

## 2019-03-07 ENCOUNTER — Encounter: Payer: Self-pay | Admitting: Internal Medicine

## 2019-03-18 ENCOUNTER — Ambulatory Visit (INDEPENDENT_AMBULATORY_CARE_PROVIDER_SITE_OTHER): Payer: Medicare HMO

## 2019-03-18 ENCOUNTER — Encounter: Payer: Self-pay | Admitting: Internal Medicine

## 2019-03-18 DIAGNOSIS — Z23 Encounter for immunization: Secondary | ICD-10-CM | POA: Diagnosis not present

## 2019-03-18 NOTE — Telephone Encounter (Signed)
This pt actually came in to get his flu shot this afternoon. I gave him his flu shot.. not sure what he is talking abt. He might have felt what was on the needle when I closed the cap, but the injection was already administered Dr. Quay Burow.Marland KitchenJohny Chess

## 2019-04-05 ENCOUNTER — Encounter: Payer: Self-pay | Admitting: Internal Medicine

## 2019-04-05 DIAGNOSIS — Z1159 Encounter for screening for other viral diseases: Secondary | ICD-10-CM

## 2019-04-06 ENCOUNTER — Encounter: Payer: Self-pay | Admitting: Internal Medicine

## 2019-04-06 ENCOUNTER — Other Ambulatory Visit: Payer: Self-pay | Admitting: *Deleted

## 2019-04-06 ENCOUNTER — Telehealth: Payer: Self-pay

## 2019-04-06 DIAGNOSIS — R6889 Other general symptoms and signs: Secondary | ICD-10-CM | POA: Diagnosis not present

## 2019-04-06 DIAGNOSIS — Z20822 Contact with and (suspected) exposure to covid-19: Secondary | ICD-10-CM

## 2019-04-06 NOTE — Telephone Encounter (Signed)
Copied from [place] [id number]. Topic: General - Other >> Apr 06, 2019  9:14 AM Reyne Dumas L wrote: Reason for CRM:  See MyChart message:  Pt called in to find out if he could be tested today.  Called [facility] and advised just to give testing site info.  Pt states he will go today.

## 2019-04-06 NOTE — Telephone Encounter (Signed)
FYI

## 2019-04-07 ENCOUNTER — Encounter: Payer: Self-pay | Admitting: Internal Medicine

## 2019-04-07 LAB — NOVEL CORONAVIRUS, NAA: SARS-CoV-2, NAA: NOT DETECTED

## 2019-05-05 ENCOUNTER — Encounter: Payer: Self-pay | Admitting: Internal Medicine

## 2019-05-05 ENCOUNTER — Other Ambulatory Visit: Payer: Self-pay | Admitting: Internal Medicine

## 2019-05-06 MED ORDER — TRULICITY 0.75 MG/0.5ML ~~LOC~~ SOAJ: 0.7500 mg | SUBCUTANEOUS | 5 refills | Status: DC

## 2019-05-06 NOTE — Addendum Note (Signed)
Addended by: Binnie Rail on: 05/06/2019 12:22 PM   Modules accepted: Orders

## 2019-05-07 ENCOUNTER — Encounter: Payer: Self-pay | Admitting: Internal Medicine

## 2019-05-07 ENCOUNTER — Other Ambulatory Visit: Payer: Self-pay | Admitting: Internal Medicine

## 2019-05-16 ENCOUNTER — Encounter: Payer: Self-pay | Admitting: Internal Medicine

## 2019-07-02 ENCOUNTER — Encounter: Payer: Self-pay | Admitting: Internal Medicine

## 2019-07-11 ENCOUNTER — Encounter: Payer: Self-pay | Admitting: Internal Medicine

## 2019-07-11 DIAGNOSIS — H9319 Tinnitus, unspecified ear: Secondary | ICD-10-CM

## 2019-07-13 ENCOUNTER — Encounter: Payer: Self-pay | Admitting: Internal Medicine

## 2019-07-13 DIAGNOSIS — E119 Type 2 diabetes mellitus without complications: Secondary | ICD-10-CM | POA: Diagnosis not present

## 2019-07-13 DIAGNOSIS — H401131 Primary open-angle glaucoma, bilateral, mild stage: Secondary | ICD-10-CM | POA: Diagnosis not present

## 2019-07-13 DIAGNOSIS — H524 Presbyopia: Secondary | ICD-10-CM | POA: Diagnosis not present

## 2019-07-13 LAB — HM DIABETES EYE EXAM

## 2019-07-15 MED ORDER — BLOOD GLUCOSE MONITOR KIT: PACK | 0 refills | Status: DC

## 2019-07-15 NOTE — Addendum Note (Signed)
Addended by: Binnie Rail on: 07/15/2019 07:31 AM   Modules accepted: Orders

## 2019-07-20 ENCOUNTER — Other Ambulatory Visit: Payer: Self-pay | Admitting: *Deleted

## 2019-07-20 MED ORDER — BLOOD GLUCOSE MONITOR KIT: 1.0000 | PACK | Freq: Four times a day (QID) | 0 refills | Status: DC

## 2019-07-20 MED ORDER — BLOOD GLUCOSE MONITOR KIT: PACK | 0 refills | Status: DC

## 2019-07-20 MED ORDER — BLOOD GLUCOSE MONITOR KIT: 1.0000 | PACK | Freq: Four times a day (QID) | 0 refills | Status: AC

## 2019-07-20 NOTE — Addendum Note (Signed)
Addended by: Cresenciano Lick on: 07/20/2019 10:08 AM   Modules accepted: Orders

## 2019-07-31 DIAGNOSIS — R69 Illness, unspecified: Secondary | ICD-10-CM | POA: Diagnosis not present

## 2019-08-02 ENCOUNTER — Encounter: Payer: Self-pay | Admitting: Internal Medicine

## 2019-08-03 DIAGNOSIS — H401131 Primary open-angle glaucoma, bilateral, mild stage: Secondary | ICD-10-CM | POA: Diagnosis not present

## 2019-08-03 DIAGNOSIS — R69 Illness, unspecified: Secondary | ICD-10-CM | POA: Diagnosis not present

## 2019-08-17 ENCOUNTER — Encounter: Payer: Self-pay | Admitting: Internal Medicine

## 2019-08-23 ENCOUNTER — Other Ambulatory Visit: Payer: Self-pay | Admitting: Internal Medicine

## 2019-08-27 ENCOUNTER — Ambulatory Visit (INDEPENDENT_AMBULATORY_CARE_PROVIDER_SITE_OTHER): Payer: Medicare HMO | Admitting: Otolaryngology

## 2019-09-07 ENCOUNTER — Encounter: Payer: Self-pay | Admitting: Internal Medicine

## 2019-09-07 DIAGNOSIS — I10 Essential (primary) hypertension: Secondary | ICD-10-CM

## 2019-09-24 ENCOUNTER — Ambulatory Visit (INDEPENDENT_AMBULATORY_CARE_PROVIDER_SITE_OTHER): Payer: Medicare HMO | Admitting: Otolaryngology

## 2019-09-25 DIAGNOSIS — I10 Essential (primary) hypertension: Secondary | ICD-10-CM | POA: Diagnosis not present

## 2019-09-25 DIAGNOSIS — E1169 Type 2 diabetes mellitus with other specified complication: Secondary | ICD-10-CM | POA: Diagnosis not present

## 2019-09-25 DIAGNOSIS — I872 Venous insufficiency (chronic) (peripheral): Secondary | ICD-10-CM | POA: Insufficient documentation

## 2019-09-25 DIAGNOSIS — Z0000 Encounter for general adult medical examination without abnormal findings: Secondary | ICD-10-CM | POA: Diagnosis not present

## 2019-09-25 DIAGNOSIS — E1165 Type 2 diabetes mellitus with hyperglycemia: Secondary | ICD-10-CM | POA: Diagnosis not present

## 2019-09-25 DIAGNOSIS — E669 Obesity, unspecified: Secondary | ICD-10-CM | POA: Insufficient documentation

## 2019-09-25 DIAGNOSIS — Z8546 Personal history of malignant neoplasm of prostate: Secondary | ICD-10-CM | POA: Diagnosis not present

## 2019-09-25 DIAGNOSIS — E1159 Type 2 diabetes mellitus with other circulatory complications: Secondary | ICD-10-CM | POA: Insufficient documentation

## 2019-09-25 DIAGNOSIS — E785 Hyperlipidemia, unspecified: Secondary | ICD-10-CM | POA: Diagnosis not present

## 2019-09-25 DIAGNOSIS — M25531 Pain in right wrist: Secondary | ICD-10-CM | POA: Diagnosis not present

## 2019-09-29 DIAGNOSIS — E210 Primary hyperparathyroidism: Secondary | ICD-10-CM | POA: Diagnosis present

## 2019-10-01 ENCOUNTER — Ambulatory Visit: Payer: Medicare HMO | Admitting: Cardiology

## 2019-10-05 ENCOUNTER — Encounter: Payer: Self-pay | Admitting: Internal Medicine

## 2019-10-06 ENCOUNTER — Encounter: Payer: Self-pay | Admitting: Internal Medicine

## 2019-10-26 ENCOUNTER — Ambulatory Visit: Payer: Medicare HMO | Admitting: Cardiology

## 2019-10-27 ENCOUNTER — Other Ambulatory Visit: Payer: Self-pay | Admitting: Internal Medicine

## 2019-10-28 DIAGNOSIS — M654 Radial styloid tenosynovitis [de Quervain]: Secondary | ICD-10-CM | POA: Diagnosis not present

## 2019-10-28 DIAGNOSIS — M25531 Pain in right wrist: Secondary | ICD-10-CM | POA: Diagnosis not present

## 2019-11-02 ENCOUNTER — Telehealth: Payer: Self-pay | Admitting: Internal Medicine

## 2019-11-02 DIAGNOSIS — I10 Essential (primary) hypertension: Secondary | ICD-10-CM

## 2019-11-02 NOTE — Progress Notes (Signed)
  Chronic Care Management   Outreach Note  11/02/2019 Name: MECO SCHLAFER MRN: [id number] DOB: 1956/05/06  Referred by: Binnie Rail, MD Reason for referral : No chief complaint on file.   An unsuccessful telephone outreach was attempted today. The patient was referred to the pharmacist for assistance with care management and care coordination.   Follow Up Plan:   Raynicia Dukes [employer] Scheduler

## 2019-11-02 NOTE — Chronic Care Management (AMB) (Signed)
  Chronic Care Management   Note  11/02/2019 Name: Derrick Walker MRN: [id number] DOB: [birth year]  Derrick Walker is a 64 y.o. year old male who is a primary care patient of Burns, Claudina Lick, MD. I reached out to Tish Men by phone today in response to a referral sent by Derrick Walker's PCP, Binnie Rail, MD.   Derrick Walker was given information about Chronic Care Management services today including:  1. CCM service includes personalized support from designated clinical staff supervised by his physician, including individualized plan of care and coordination with other care providers 2. 24/7 contact phone numbers for assistance for urgent and routine care needs. 3. Service will only be billed when office clinical staff spend 20 minutes or more in a month to coordinate care. 4. Only one practitioner may furnish and bill the service in a calendar month. 5. The patient may stop CCM services at any time (effective at the end of the month) by phone call to the office staff.   Patient agreed to services and verbal consent obtained.   Follow up plan:   Derrick Walker [employer] Scheduler

## 2019-11-11 ENCOUNTER — Ambulatory Visit: Payer: Medicare HMO | Admitting: Cardiology

## 2019-11-23 NOTE — Addendum Note (Signed)
Addended by: Aviva Signs M on: 11/23/2019 11:57 AM   Modules accepted: Orders

## 2019-11-24 ENCOUNTER — Telehealth: Payer: Self-pay | Admitting: Internal Medicine

## 2019-11-24 NOTE — Progress Notes (Signed)
Trying to get in contact with Derrick Walker to reschedule his appointment with the CPP for CCM.   This note is not being shared with the patient for the following reason: To respect privacy (The patient or proxy has requested that the information not be shared).     Raynicia Dukes [employer] Scheduler

## 2019-11-25 ENCOUNTER — Ambulatory Visit (INDEPENDENT_AMBULATORY_CARE_PROVIDER_SITE_OTHER): Payer: Medicare HMO | Admitting: Otolaryngology

## 2019-11-26 ENCOUNTER — Telehealth: Payer: Medicare HMO

## 2019-12-01 DIAGNOSIS — H401131 Primary open-angle glaucoma, bilateral, mild stage: Secondary | ICD-10-CM | POA: Diagnosis not present

## 2019-12-02 ENCOUNTER — Telehealth: Payer: Self-pay | Admitting: Internal Medicine

## 2019-12-02 NOTE — Progress Notes (Signed)
  Chronic Care Management   Note  12/02/2019 Name: ANIEL WINNS MRN: [id number] DOB: [birth year]  FONG MARCOS is a 64 y.o. year old male who is a primary care patient of Burns, Claudina Lick, MD. I reached out to Tish Men by phone today in response to a referral sent by Mr. Korby Peluso Constable's PCP, Binnie Rail, MD.   Mr. Glomb was given information about Chronic Care Management services today including:  1. CCM service includes personalized support from designated clinical staff supervised by his physician, including individualized plan of care and coordination with other care providers 2. 24/7 contact phone numbers for assistance for urgent and routine care needs. 3. Service will only be billed when office clinical staff spend 20 minutes or more in a month to coordinate care. 4. Only one practitioner may furnish and bill the service in a calendar month. 5. The patient may stop CCM services at any time (effective at the end of the month) by phone call to the office staff.   Patient wishes to consider information provided and/or speak with a member of the care team before deciding about enrollment in care management services.   This note is not being shared with the patient for the following reason: To respect privacy (The patient or proxy has requested that the information not be shared).  Follow up plan:   Earney [place] [employer] Scheduler

## 2019-12-08 ENCOUNTER — Telehealth: Payer: Self-pay | Admitting: *Deleted

## 2019-12-08 ENCOUNTER — Ambulatory Visit: Payer: Medicare HMO | Admitting: Cardiology

## 2019-12-08 NOTE — Telephone Encounter (Signed)
Patient spoke to scheduler appt reschedule to 12/24/19 with Dr Ellyn Hack.

## 2019-12-08 NOTE — Telephone Encounter (Signed)
Called left message to cal back - need to reschedule today's appt.   possible move to  12/11/19 with Rosaria Ferries PA ( to start treatment)  if patient is agreeable other wise next available will be  July 2021  With Dr Ellyn Hack.

## 2019-12-09 DIAGNOSIS — M654 Radial styloid tenosynovitis [de Quervain]: Secondary | ICD-10-CM | POA: Diagnosis not present

## 2019-12-24 ENCOUNTER — Encounter: Payer: Self-pay | Admitting: Cardiology

## 2019-12-24 ENCOUNTER — Other Ambulatory Visit: Payer: Self-pay

## 2019-12-24 ENCOUNTER — Ambulatory Visit: Payer: Medicare HMO | Admitting: Cardiology

## 2019-12-24 VITALS — BP 176/102 | HR 82 | Ht 71.0 in | Wt 249.0 lb

## 2019-12-24 DIAGNOSIS — R9431 Abnormal electrocardiogram [ECG] [EKG]: Secondary | ICD-10-CM | POA: Diagnosis not present

## 2019-12-24 DIAGNOSIS — I517 Cardiomegaly: Secondary | ICD-10-CM | POA: Diagnosis not present

## 2019-12-24 DIAGNOSIS — I10 Essential (primary) hypertension: Secondary | ICD-10-CM

## 2019-12-24 MED ORDER — AMLODIPINE BESYLATE 5 MG PO TABS
5.0000 mg | ORAL_TABLET | Freq: Every day | ORAL | 3 refills | Status: DC
Start: 2019-12-24 — End: 2021-04-03

## 2019-12-24 MED ORDER — HYDROCHLOROTHIAZIDE 25 MG PO TABS: 25.0000 mg | ORAL_TABLET | Freq: Every day | ORAL | 3 refills | Status: DC

## 2019-12-24 NOTE — Progress Notes (Signed)
Primary Care Provider: Binnie Rail, MD Cardiologist: Glenetta Hew, MD Electrophysiologist: None  Clinic Note: Chief Complaint  Patient presents with  . Hypertension    Poorly controlled    HPI:    Derrick Walker is a 64 y.o. male with a PMH below who presents today for reestablishing cardiology care for hypertension.  Derrick Walker was last seen in August 2015.  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  It is admittedly seen in February 15 as self-referral for hypertension. ->  In the interim from last visit, he was diagnosed with prostate cancer and was undergoing therapy.  He had no active cardiac symptoms.  I started losartan 25 mg daily.  Discussed weight loss.  Was lost to follow-up at this visit.  Recent Hospitalizations: N/A  Reviewed  CV studies:    The following studies were reviewed today: (if available, images/films reviewed: From Epic Chart or Care Everywhere) . Echo reviewed below:   Interval History:   Derrick Walker presents today for delayed follow-up now again with the general hypertension.  He says at home his blood pressure usually runs about 140-150/90-95 mmHg.  Pressure today is much higher than usual.  He does note that he is gained about 40 pounds since the onset of COVID-19, he got out of shape during grad school, and now he says his PhD attending is open to get back into his exercise routine exam. He is in the process of problem changing PCP, not sure of the reason, may be related to location.  Thankfully, despite having high blood pressure, he really does not have to explain any cardiac symptoms.  Maybe has a little bit of exertional dyspnea, but no real PND or orthopnea.  No real edema..  He does have headaches when his blood pressure is high, but this is not all the time.  No associated arrhythmia symptoms of palpitation etc.  No syncope/near syncope  or TIA/amaurosis fugax.  CV Review of Symptoms (Summary) Cardiovascular ROS: positive for - dyspnea on exertion and Occasional headaches with high blood pressure negative for - chest pain, edema, irregular heartbeat, orthopnea, palpitations, paroxysmal nocturnal dyspnea, rapid heart rate, shortness of breath or Syncope/near syncope TIA/amaurosis fugax, claudication  The patient does not have symptoms concerning for COVID-19 infection (fever, chills, cough, or new shortness of breath).  The patient is practicing social distancing & Masking.    REVIEWED OF SYSTEMS   Review of Systems  Constitutional: Negative for malaise/fatigue and weight loss (40 pound weight gain during Covid).  HENT: Negative for nosebleeds.   Respiratory: Negative for cough and shortness of breath.   Cardiovascular: Negative for leg swelling.  Gastrointestinal: Negative for abdominal pain, blood in stool and melena.  Genitourinary: Negative for hematuria and urgency.  Musculoskeletal: Positive for joint pain. Negative for myalgias.  Neurological: Positive for headaches. Negative for dizziness, focal weakness and weakness.  Psychiatric/Behavioral: Negative for depression and memory loss. The patient is nervous/anxious. The patient does not have insomnia.    I have reviewed and (if needed) personally updated the patient's problem list, medications, allergies, past medical and surgical history, social and family history.   PAST MEDICAL HISTORY   Past Medical History:  Diagnosis Date  . Erectile dysfunction   . Grade I diastolic dysfunction 92/42/6834   Noted on ECHO  . History of kidney stones 08/18/2018   7-8  mm left UVJ stone with left hydronephrosis and hydroureter.  Marland Kitchen History of prostate cancer urologist-  dr Orpah Melter   dx 05/ 2015 -- moderate risk Stage T2c, Gleason 3+4,  vol 152.41cc---  09/ 2015 s/p  radical prostatectomy -- per pt last PSA 07-15-2016  undectable  . Hydronephrosis 08/18/2018    7-8 mm left UVJ stone with left hydronephrosis and hydroureter.  . Hypertension, essential    Difficult control abdominal pain medications.  Marland Kitchen LVH (left ventricular hypertrophy) 10/14/2013   Mild, Noted on ECHO  . Nephrolithiasis   . Obese    Gained 40 pounds during COVID-19 isolation-lack of exercise  . Right inguinal hernia    from penile prosthesis reservior post surgery 11-19-2016  . Right inguinal pain 01/03/2017  . Sepsis due to Escherichia coli ([place]) 07/2018   urosepsis and E. Coli bacteremia   . Spinal stenosis, lumbosacral region 03/30/2011  . Type 2 diabetes mellitus ([place])     PAST SURGICAL HISTORY   Past Surgical History:  Procedure Laterality Date  . CYSTOSCOPY/URETEROSCOPY/HOLMIUM LASER/STENT PLACEMENT Left 08/18/2018   Procedure: CYSTOSCOPY/LEFT /STENT PLACEMENT;  Surgeon: Ceasar Mons, MD;  Location: [facility];  Service: Urology;  Laterality: Left;  . CYSTOSCOPY/URETEROSCOPY/HOLMIUM LASER/STENT PLACEMENT Left 09/03/2018   Procedure: CYSTOSCOPY/URETEROSCOPY/HOLMIUM LASER/STENT PLACEMENT;  Surgeon: Ceasar Mons, MD;  Location: [facility];  Service: Urology;  Laterality: Left;  . INGUINAL HERNIA REPAIR Right 01/14/2017   Procedure: HERNIA REPAIR INGUINAL ADULT;  Surgeon: Cleon Gustin, MD;  Location: [facility];  Service: Urology;  Laterality: Right;  . LYMPHADENECTOMY Bilateral 04/09/2014   Procedure: BILATERAL PELVIC LYMPH NODE DISSECTION;  Surgeon: Alexis Frock, MD;  Location: [facility];  Service: Urology;  Laterality: Bilateral;  . PENILE PROSTHESIS IMPLANT N/A 11/19/2016   Procedure: AMS PENILE PROTHESIS INFLATABLE;  Surgeon: Cleon Gustin, MD;  Location: [facility];  Service: Urology;  Laterality: N/A;  . PENILE PROSTHESIS IMPLANT Right 01/14/2017   Procedure: REVISION PENILE PROSTHESIS;  Surgeon: Cleon Gustin, MD;  Location[facility];  Service: Urology;  Laterality:  Right;  . PENILE PROSTHESIS IMPLANT N/A 02/21/2017   Procedure: PENILE PROSTHESIS REVISION;  Surgeon: Cleon Gustin, MD;  Location: [facility];  Service: Urology;  Laterality: N/A;  . PROSTATE BIOPSY N/A 12/11/2013   Procedure: BIOPSY TRANSRECTAL ULTRASONIC PROSTATE (TUBP);  Surgeon: Arvil Persons, MD;  Location: [facility];  Service: Urology;  Laterality: N/A;  . ROBOT ASSISTED LAPAROSCOPIC RADICAL PROSTATECTOMY N/A 04/09/2014   Procedure: ROBOTIC ASSISTED LAPAROSCOPIC RADICAL PROSTATECTOMY, INDOCYANINE GREEN DYE ;  Surgeon: Alexis Frock, MD;  Location: [facility];  Service: Urology;  Laterality: N/A;  . TRANSTHORACIC ECHOCARDIOGRAM  10/14/2013   mild LVH/  ef  17-49%/  grade I diastolic dysfunction/  mild LAE    MEDICATIONS/ALLERGIES   Current Meds  Medication Sig  . blood glucose meter kit and supplies KIT Inject 1 each into the skin 4 (four) times daily. Dispense based on patient and insurance preference. Dx: E11.9  . EPINEPHRINE 0.3 mg/0.3 mL IJ SOAJ injection INJECT 0.3 MLS INTO THE MUSCLE ONCE FOR 1 DOSE  . hydrochlorothiazide (HYDRODIURIL) 25 MG tablet Take 1 tablet (25 mg total) by mouth daily.  . metFORMIN (GLUCOPHAGE) 1000 MG tablet TAKE 1 TABLET BY MOUTH TWICE DAILY WITH A MEAL  . [DISCONTINUED] hydrochlorothiazide (HYDRODIURIL) 25 MG tablet Take 1 tablet by mouth once daily    Allergies  Allergen Reactions  . Shellfish  Allergy Anaphylaxis  . Oxycodone Hives    Able to take percocet without problems  . Lisinopril Other (See Comments)    Pt states gives him spasm in chest  . Losartan Other (See Comments)    Kidney pain, leg swelling    SOCIAL HISTORY/FAMILY HISTORY   Social History   Tobacco Use  . Smoking status: Never Smoker  . Smokeless tobacco: Never Used  Substance Use Topics  . Alcohol use: No    Alcohol/week: 7.0 standard drinks    Types: 7 Standard drinks or equivalent per week  . Drug use: No   Social History   Social  History Narrative   He is a divorced father of 3. 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        He never smoked. He drinks about 3 glasses of wine with food a week. He uses to help digest food.   He is Jewish and into [employer] does not eat any pork/ham and only a small amount of beef.   Contact information:   Son: Mardy Lucier: [phone]   Daughter: Cordel Drewes: [phone]   Family History  Problem Relation Age of Onset  . Hypertension Mother   . Hypertension Maternal Grandmother   . Hypertension Paternal Grandmother   . Kidney failure Father   . Hypertension Brother   . Colon cancer Neg Hx   . Colon polyps Neg Hx   . Esophageal cancer Neg Hx   . Rectal cancer Neg Hx   . Stomach cancer Neg Hx     OBJCTIVE -PE, EKG, labs   Wt Readings from Last 3 Encounters:  12/24/19 249 lb (112.9 kg)  03/05/19 246 lb (111.6 kg)  09/03/18 219 lb 4 oz (99.5 kg)    Physical Exam: BP (!) 176/102   Pulse 82   Ht _0  (1.803 m)   Wt 249 lb (112.9 kg)   BMI 34.73 kg/m  Physical Exam  Constitutional: He is oriented to person, place, and time. He appears well-developed and well-nourished. No distress.  Moderately obese, well-groomed.  HENT:  Head: Normocephalic and atraumatic.  Neck: No JVD present.  Cardiovascular: Normal rate, regular rhythm, normal heart sounds and intact distal pulses. Exam reveals no gallop and no friction rub.  No murmur heard. Pulmonary/Chest: Effort normal and breath sounds normal.  Abdominal: Soft. Bowel sounds are normal. He exhibits no distension. There is no abdominal tenderness. There is no rebound.  No HSM  Musculoskeletal:        General: Edema (Trivial) present. Normal range of motion.     Cervical back: Normal range of motion and neck supple.  Neurological: He is alert and oriented to person, place, and time.  Psychiatric: His behavior is normal. Judgment and thought  content normal.     Adult ECG Report  Rate: 82;  Rhythm: normal sinus rhythm and 1 degree AVB.  Inferior ST-T wave changes consistent with LVH repolarization versus ischemia.;   Narrative Interpretation: Borderline EKG  Recent Labs:    Lab Results  Component Value Date   CHOL 171 03/05/2019   HDL 43.40 03/05/2019   LDLCALC 103 (H) 03/05/2019   TRIG 121.0 03/05/2019   CHOLHDL 4 03/05/2019   Lab Results  Component Value Date   CREATININE 1.02 03/05/2019   BUN 14 03/05/2019   NA 137 03/05/2019   K 3.7 03/05/2019   CL 101 03/05/2019   CO2 27 03/05/2019  Lab Results  Component Value Date   TSH 2.00 03/05/2019    ASSESSMENT/PLAN    Problem List Items Addressed This Visit    Hypertension, essential (Chronic)   Relevant Medications   amLODipine (NORVASC) 5 MG tablet   hydrochlorothiazide (HYDRODIURIL) 25 MG tablet   Other Relevant Orders   EKG 12-Lead (Completed)   ECHOCARDIOGRAM COMPLETE   Abnormal finding on EKG (Chronic)   Relevant Orders   EKG 12-Lead (Completed)   ECHOCARDIOGRAM COMPLETE   VAS US RENAL ARTERY DUPLEX   Comprehensive metabolic panel   LVH (left ventricular hypertrophy) - an EKG   Relevant Medications   amLODipine (NORVASC) 5 MG tablet   hydrochlorothiazide (HYDRODIURIL) 25 MG tablet   Other Relevant Orders   EKG 12-Lead (Completed)   ECHOCARDIOGRAM COMPLETE   VAS US RENAL ARTERY DUPLEX   Comprehensive metabolic panel    Other Visit Diagnoses    Accelerated hypertension    -  Primary   Relevant Medications   amLODipine (NORVASC) 5 MG tablet   hydrochlorothiazide (HYDRODIURIL) 25 MG tablet   Other Relevant Orders   EKG 12-Lead (Completed)   ECHOCARDIOGRAM COMPLETE   VAS US RENAL ARTERY DUPLEX   Comprehensive metabolic panel     Derrick Walker is here today to reestablish care with his elevated blood pressures and difficulty for pressures.  He does have symptoms of headache and dyspnea with hypertension.  Clinically no heart failure  symptoms.  Unfortunately, he is not on any medications.  Plan:   check 2D echocardiogram to assess for any signs of end-stage organ disease suggestive of hypertensive heart disease.    check renal Dopplers to exclude bleeding or excessive.  Continue HCTZ and add amlodipine 5 mg daily.  Follow-up with CVRR hypertension clinic in roughly 2 weeks-we will need to consider further titration of amlodipine and likely addition of ACE inhibitor/ARB. -  We will check baseline CMP prior to starting ACE inhibitor/ARB     COVID-19 Education: The signs and symptoms of COVID-19 were discussed with the patient and how to seek care for testing (follow up with PCP or arrange E-visit).   The importance of social distancing and COVID-19 vaccination was discussed today.  I spent a total of 35 minutes with the patient. >  50% of the time was spent in direct patient consultation.  we talked about diet exercise as.  Mild modifications diet.  We talked about his new career and changes since I last saw him.  He had lots of questions. Additional time spent with chart review  / charting (studies, outside notes, etc): 10 Total Time: 45 min  Current medicines are reviewed at length with the patient today.  (+/- concerns) none  Notice: This dictation was prepared with Dragon dictation along with smaller phrase technology. Any transcriptional errors that result from this process are unintentional and may not be corrected upon review.  Patient Instructions / Medication Changes & Studies & Tests Ordered   Patient Instructions  Medication Instructions:  START  TAKING AMLODIPINE 5 MG AT BEDTIME  *If you need a refill on your cardiac medications before your next appointment, please call your pharmacy*   Lab Work:  CMP    2 -3 WEEKS     If you have labs (blood work) drawn today and your tests are completely normal, you will receive your results only by: Marland Kitchen MyChart Message (if you have MyChart) OR . A paper copy  in the mail If you have any lab test that is  abnormal or we need to change your treatment, we will call you to review the results.   Testing/Procedures:  will be schedule  At [place] has requested that you have an echocardiogram. Echocardiography is a painless test that uses sound waves to create images of your heart. It provides your doctor with information about the size and shape of your heart and how well your heart's chambers and valves are working. This procedure takes approximately one hour. There are no restrictions for this procedure.  AND  WILL BE SCHEDULE AT [place] Your physician has requested that you have a renal artery duplex. During this test, an ultrasound is used to evaluate blood flow to the kidneys. Allow one hour for this exam. Do not eat after midnight the day before and avoid carbonated beverages. Take your medications as you usually do.     Follow-Up: At [facility], you and your health needs are our priority.  As part of our continuing mission to provide you with exceptional heart care, we have created designated Provider Care Teams.  These Care Teams include your primary Cardiologist (physician) and Advanced Practice Providers (APPs -  Physician Assistants and Nurse Practitioners) who all work together to provide you with the care you need, when you need it.  We recommend signing up for the patient portal called "MyChart".  Sign up information is provided on this After Visit Summary.  MyChart is used to connect with patients for Virtual Visits (Telemedicine).  Patients are able to view lab/test results, encounter notes, upcoming appointments, etc.  Non-urgent messages can be sent to your provider as well.   To learn more about what you can do with MyChart, go to [email or web address].    Your next appointment:   2 month(s)  The format for your next appointment:   In Person  Provider:   Glenetta Hew, MD  Your physician recommends that you schedule a follow-up appointment in 2 WEEKS WITH CVRR - BLOOD PRESSURES  Other Instructions     Studies Ordered:   Orders Placed This Encounter  Procedures  . Comprehensive metabolic panel  . EKG 12-Lead  . ECHOCARDIOGRAM COMPLETE  . VAS US RENAL ARTERY DUPLEX     Glenetta Hew, M.D., M.S. Interventional Cardiologist   Pager # [phone] Phone # [phone] [address] [place], [place] [address]   Thank you for choosing Heartcare at [facility]!!

## 2019-12-24 NOTE — Patient Instructions (Signed)
Medication Instructions:  START  TAKING AMLODIPINE 5 MG AT BEDTIME  *If you need a refill on your cardiac medications before your next appointment, please call your pharmacy*   Lab Work:  [place]     If you have labs (blood work) drawn today and your tests are completely normal, you will receive your results only by: Marland Kitchen MyChart Message (if you have MyChart) OR . A paper copy in the mail If you have any lab test that is abnormal or we need to change your treatment, we will call you to review the results.   Testing/Procedures:  will be schedule  At [place] has requested that you have an echocardiogram. Echocardiography is a painless test that uses sound waves to create images of your heart. It provides your doctor with information about the size and shape of your heart and how well your heart's chambers and valves are working. This procedure takes approximately one hour. There are no restrictions for this procedure.  AND  WILL BE SCHEDULE AT [place] Your physician has requested that you have a renal artery duplex. During this test, an ultrasound is used to evaluate blood flow to the kidneys. Allow one hour for this exam. Do not eat after midnight the day before and avoid carbonated beverages. Take your medications as you usually do.     Follow-Up: At [facility], you and your health needs are our priority.  As part of our continuing mission to provide you with exceptional heart care, we have created designated Provider Care Teams.  These Care Teams include your primary Cardiologist (physician) and Advanced Practice Providers (APPs -  Physician Assistants and Nurse Practitioners) who all work together to provide you with the care you need, when you need it.  We recommend signing up for the patient portal called "MyChart".  Sign up information is provided on this After Visit Summary.  MyChart is used to connect  with patients for Virtual Visits (Telemedicine).  Patients are able to view lab/test results, encounter notes, upcoming appointments, etc.  Non-urgent messages can be sent to your provider as well.   To learn more about what you can do with MyChart, go to [email or web address].    Your next appointment:   2 month(s)  The format for your next appointment:   In Person  Provider:   Glenetta Hew, MD  Your physician recommends that you schedule a follow-up appointment in 2 WEEKS WITH CVRR - BLOOD PRESSURES  Other Instructions

## 2019-12-27 ENCOUNTER — Encounter: Payer: Self-pay | Admitting: Cardiology

## 2020-01-04 ENCOUNTER — Ambulatory Visit: Payer: Medicare HMO

## 2020-01-08 DIAGNOSIS — I517 Cardiomegaly: Secondary | ICD-10-CM | POA: Diagnosis not present

## 2020-01-08 DIAGNOSIS — R9431 Abnormal electrocardiogram [ECG] [EKG]: Secondary | ICD-10-CM | POA: Diagnosis not present

## 2020-01-08 DIAGNOSIS — I10 Essential (primary) hypertension: Secondary | ICD-10-CM | POA: Diagnosis not present

## 2020-01-09 LAB — COMPREHENSIVE METABOLIC PANEL
ALT: 41 IU/L (ref 0–44)
AST: 26 IU/L (ref 0–40)
Albumin/Globulin Ratio: 2 (ref 1.2–2.2)
Albumin: 4.3 g/dL (ref 3.8–4.8)
Alkaline Phosphatase: 61 IU/L (ref 48–121)
BUN/Creatinine Ratio: 13 (ref 10–24)
BUN: 13 mg/dL (ref 8–27)
Bilirubin Total: 0.4 mg/dL (ref 0.0–1.2)
CO2: 27 mmol/L (ref 20–29)
Calcium: 11 mg/dL — ABNORMAL HIGH (ref 8.6–10.2)
Chloride: 100 mmol/L (ref 96–106)
Creatinine, Ser: 0.97 mg/dL (ref 0.76–1.27)
GFR calc Af Amer: 96 mL/min/{1.73_m2} (ref >59)
GFR calc non Af Amer: 83 mL/min/{1.73_m2} (ref >59)
Globulin, Total: 2.1 g/dL (ref 1.5–4.5)
Glucose: 178 mg/dL — ABNORMAL HIGH (ref 65–99)
Potassium: 4.2 mmol/L (ref 3.5–5.2)
Sodium: 140 mmol/L (ref 134–144)
Total Protein: 6.4 g/dL (ref 6.0–8.5)

## 2020-01-11 ENCOUNTER — Inpatient Hospital Stay (HOSPITAL_COMMUNITY): Admission: RE | Admit: 2020-01-11 | Payer: Medicare HMO | Source: Ambulatory Visit

## 2020-01-14 ENCOUNTER — Other Ambulatory Visit (HOSPITAL_COMMUNITY): Payer: Medicare HMO

## 2020-01-18 ENCOUNTER — Encounter: Payer: Self-pay | Admitting: Internal Medicine

## 2020-01-26 ENCOUNTER — Inpatient Hospital Stay (HOSPITAL_COMMUNITY): Admission: RE | Admit: 2020-01-26 | Payer: Medicare HMO | Source: Ambulatory Visit

## 2020-01-27 ENCOUNTER — Other Ambulatory Visit (HOSPITAL_COMMUNITY): Payer: Medicare HMO

## 2020-02-16 ENCOUNTER — Other Ambulatory Visit (HOSPITAL_COMMUNITY): Payer: Medicare HMO

## 2020-02-17 ENCOUNTER — Telehealth (HOSPITAL_COMMUNITY): Payer: Self-pay | Admitting: Cardiology

## 2020-02-17 NOTE — Telephone Encounter (Signed)
Patient called and cancelled his echocardiogram due to he did not like the billing process.  Order will be removed from [facility]. If he calls back to reschedule we can reinstate the orders.

## 2020-02-18 ENCOUNTER — Encounter (HOSPITAL_COMMUNITY): Payer: Medicare HMO

## 2020-02-18 NOTE — Telephone Encounter (Signed)
Fine  Derrick Hew, MD

## 2020-02-25 ENCOUNTER — Other Ambulatory Visit (HOSPITAL_COMMUNITY): Payer: Medicare HMO

## 2020-02-25 ENCOUNTER — Encounter (HOSPITAL_COMMUNITY): Payer: Medicare HMO

## 2020-02-26 ENCOUNTER — Ambulatory Visit (HOSPITAL_COMMUNITY): Payer: Medicare HMO

## 2020-02-29 ENCOUNTER — Ambulatory Visit: Payer: Medicare HMO | Admitting: Cardiology

## 2020-03-29 DIAGNOSIS — E1169 Type 2 diabetes mellitus with other specified complication: Secondary | ICD-10-CM | POA: Diagnosis not present

## 2020-03-29 DIAGNOSIS — Z0000 Encounter for general adult medical examination without abnormal findings: Secondary | ICD-10-CM | POA: Diagnosis not present

## 2020-03-29 DIAGNOSIS — E1165 Type 2 diabetes mellitus with hyperglycemia: Secondary | ICD-10-CM | POA: Diagnosis not present

## 2020-03-29 DIAGNOSIS — I152 Hypertension secondary to endocrine disorders: Secondary | ICD-10-CM | POA: Diagnosis not present

## 2020-03-29 DIAGNOSIS — E1159 Type 2 diabetes mellitus with other circulatory complications: Secondary | ICD-10-CM | POA: Diagnosis not present

## 2020-03-29 DIAGNOSIS — E785 Hyperlipidemia, unspecified: Secondary | ICD-10-CM | POA: Diagnosis not present

## 2020-03-29 DIAGNOSIS — E213 Hyperparathyroidism, unspecified: Secondary | ICD-10-CM | POA: Diagnosis not present

## 2020-03-29 DIAGNOSIS — R350 Frequency of micturition: Secondary | ICD-10-CM | POA: Diagnosis not present

## 2020-03-30 DIAGNOSIS — R69 Illness, unspecified: Secondary | ICD-10-CM | POA: Diagnosis not present

## 2020-03-31 ENCOUNTER — Encounter: Payer: Self-pay | Admitting: *Deleted

## 2020-04-26 DIAGNOSIS — M654 Radial styloid tenosynovitis [de Quervain]: Secondary | ICD-10-CM | POA: Diagnosis not present

## 2020-04-28 IMAGING — DX DG CHEST 1V PORT
1 series · 1 of 1 positions shown · non-contrast
Comparison: 04/07/2014

CLINICAL DATA: Sepsis.

EXAM:
PORTABLE CHEST 1 VIEW

[chest ap]
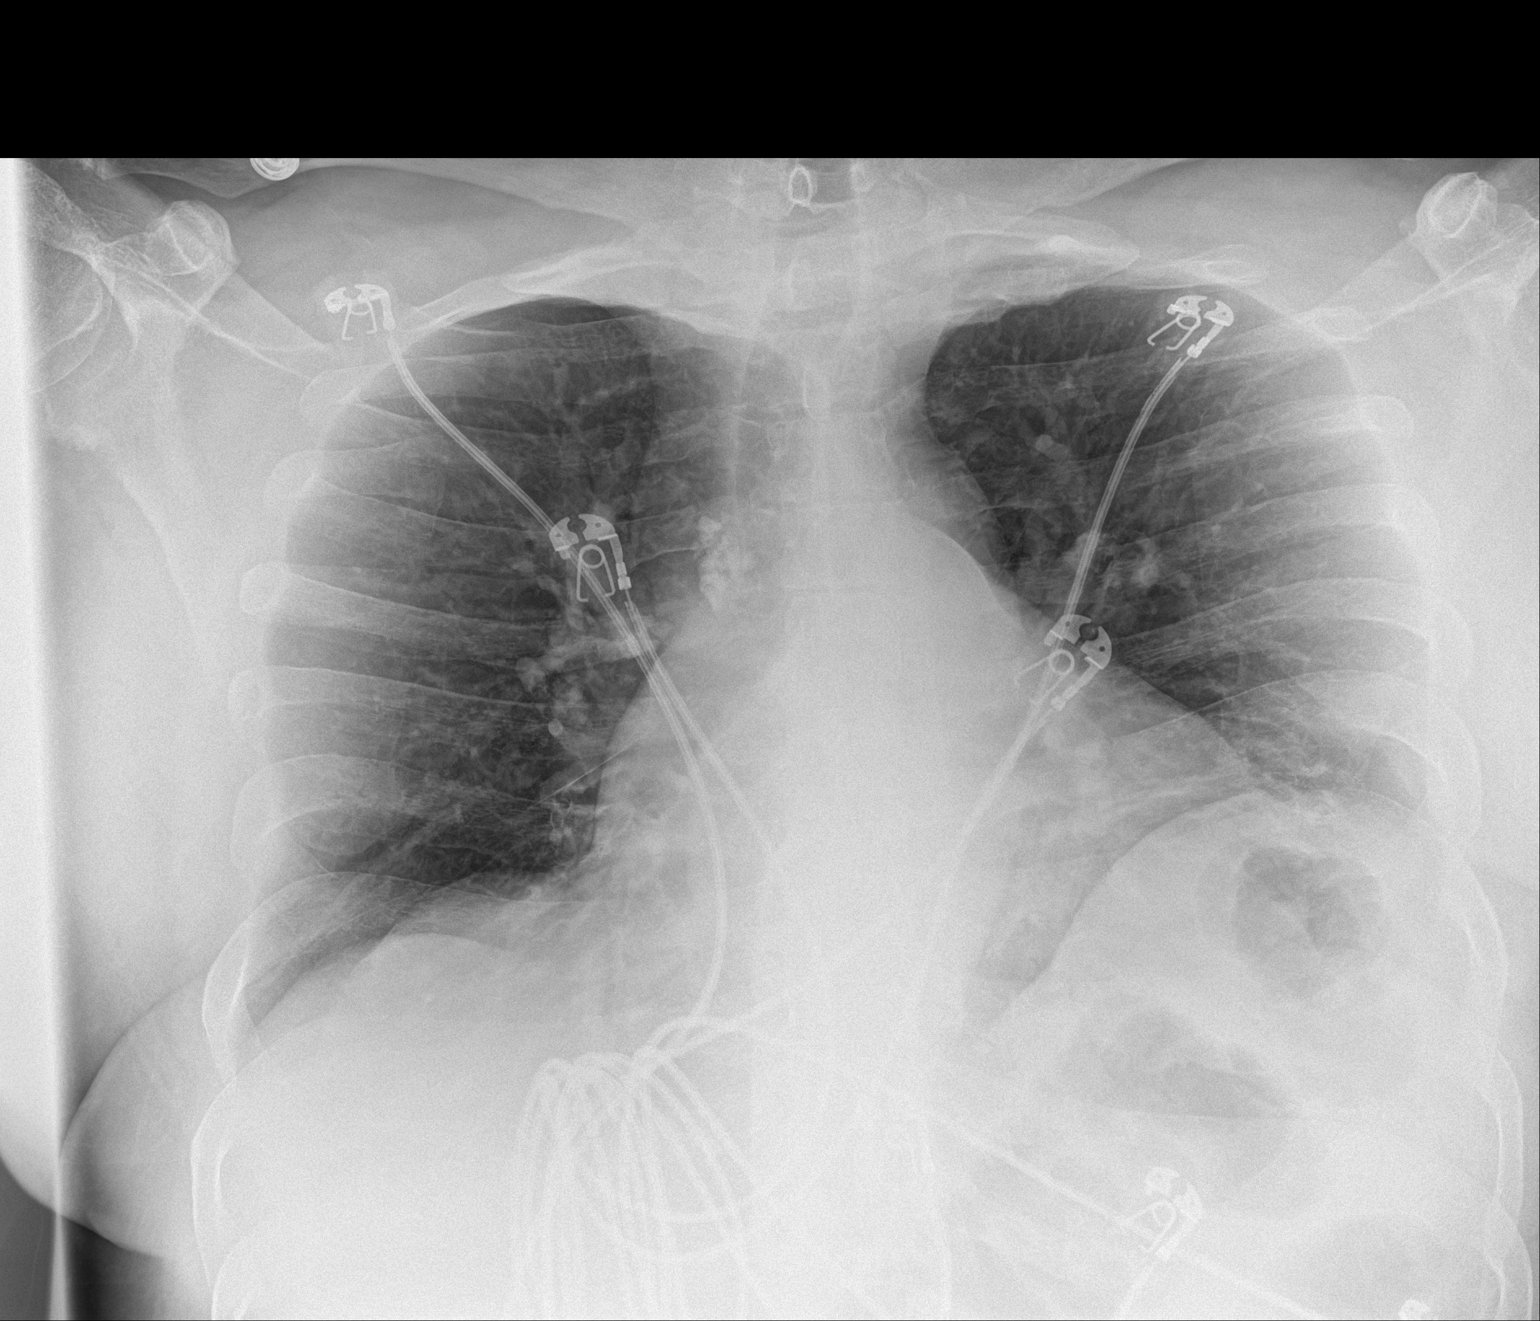

[1 of 1 positions shown; findings below may reference images not displayed]

FINDINGS: The cardiac silhouette is accentuated by low lung volumes and
portable AP technique. There is mild elevation of the left
hemidiaphragm with mild, heterogeneous left basilar opacity. The
right lung is clear. No sizable pleural effusion or pneumothorax is
identified. No acute osseous abnormality is seen.
IMPRESSION: Mild left basilar atelectasis.

## 2020-05-03 DIAGNOSIS — M654 Radial styloid tenosynovitis [de Quervain]: Secondary | ICD-10-CM | POA: Diagnosis not present

## 2020-05-25 DIAGNOSIS — E210 Primary hyperparathyroidism: Secondary | ICD-10-CM | POA: Diagnosis not present

## 2020-05-25 DIAGNOSIS — E1159 Type 2 diabetes mellitus with other circulatory complications: Secondary | ICD-10-CM | POA: Diagnosis not present

## 2020-05-25 DIAGNOSIS — E1165 Type 2 diabetes mellitus with hyperglycemia: Secondary | ICD-10-CM | POA: Diagnosis not present

## 2020-05-25 DIAGNOSIS — I152 Hypertension secondary to endocrine disorders: Secondary | ICD-10-CM | POA: Diagnosis not present

## 2020-05-25 DIAGNOSIS — E785 Hyperlipidemia, unspecified: Secondary | ICD-10-CM | POA: Diagnosis not present

## 2020-05-25 DIAGNOSIS — E1169 Type 2 diabetes mellitus with other specified complication: Secondary | ICD-10-CM | POA: Diagnosis not present

## 2020-05-30 DIAGNOSIS — M654 Radial styloid tenosynovitis [de Quervain]: Secondary | ICD-10-CM | POA: Diagnosis not present

## 2020-06-10 DIAGNOSIS — Z4789 Encounter for other orthopedic aftercare: Secondary | ICD-10-CM | POA: Diagnosis not present

## 2020-06-10 DIAGNOSIS — M654 Radial styloid tenosynovitis [de Quervain]: Secondary | ICD-10-CM | POA: Diagnosis not present

## 2020-09-01 ENCOUNTER — Other Ambulatory Visit (HOSPITAL_COMMUNITY): Payer: Self-pay | Admitting: Surgery

## 2020-09-01 DIAGNOSIS — E210 Primary hyperparathyroidism: Secondary | ICD-10-CM

## 2020-09-23 ENCOUNTER — Encounter (HOSPITAL_COMMUNITY): Payer: Self-pay

## 2020-09-23 ENCOUNTER — Other Ambulatory Visit (HOSPITAL_COMMUNITY): Payer: Medicare HMO

## 2020-10-08 ENCOUNTER — Ambulatory Visit: Payer: Self-pay | Admitting: Surgery

## 2020-11-22 ENCOUNTER — Emergency Department (HOSPITAL_COMMUNITY): Payer: Medicare HMO

## 2020-11-22 ENCOUNTER — Other Ambulatory Visit: Payer: Self-pay

## 2020-11-22 ENCOUNTER — Emergency Department (HOSPITAL_COMMUNITY)
Admission: EM | Admit: 2020-11-22 | Discharge: 2020-11-22 | Disposition: A | Discharge status: Home / Self Care | Payer: Medicare HMO | Attending: Emergency Medicine | Admitting: Emergency Medicine

## 2020-11-22 DIAGNOSIS — Z7984 Long term (current) use of oral hypoglycemic drugs: Secondary | ICD-10-CM | POA: Diagnosis not present

## 2020-11-22 DIAGNOSIS — Z79899 Other long term (current) drug therapy: Secondary | ICD-10-CM | POA: Insufficient documentation

## 2020-11-22 DIAGNOSIS — Z8546 Personal history of malignant neoplasm of prostate: Secondary | ICD-10-CM | POA: Insufficient documentation

## 2020-11-22 DIAGNOSIS — E119 Type 2 diabetes mellitus without complications: Secondary | ICD-10-CM | POA: Diagnosis not present

## 2020-11-22 DIAGNOSIS — M542 Cervicalgia: Secondary | ICD-10-CM | POA: Insufficient documentation

## 2020-11-22 DIAGNOSIS — Y92410 Unspecified street and highway as the place of occurrence of the external cause: Secondary | ICD-10-CM | POA: Insufficient documentation

## 2020-11-22 DIAGNOSIS — S39012A Strain of muscle, fascia and tendon of lower back, initial encounter: Secondary | ICD-10-CM | POA: Insufficient documentation

## 2020-11-22 DIAGNOSIS — I10 Essential (primary) hypertension: Secondary | ICD-10-CM | POA: Diagnosis not present

## 2020-11-22 DIAGNOSIS — S3992XA Unspecified injury of lower back, initial encounter: Secondary | ICD-10-CM | POA: Diagnosis present

## 2020-11-22 MED ORDER — CYCLOBENZAPRINE HCL 10 MG PO TABS: 10.0000 mg | ORAL_TABLET | Freq: Three times a day (TID) | ORAL | 0 refills | Status: DC | PRN

## 2020-11-22 MED ORDER — NAPROXEN 500 MG PO TABS
500.0000 mg | ORAL_TABLET | Freq: Once | ORAL | Status: AC
  Administered 2020-11-22: 500 mg via ORAL
  Filled 2020-11-22: qty 1
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
## 2021-03-30 NOTE — Progress Notes (Signed)
Sent message, via epic in basket, requesting orders in epic from surgeon.  

## 2021-04-01 ENCOUNTER — Ambulatory Visit: Payer: Self-pay | Admitting: Surgery
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
## 2021-04-05 NOTE — Patient Instructions (Signed)
DUE TO COVID-19 ONLY ONE VISITOR IS ALLOWED TO COME WITH YOU AND STAY IN THE WAITING ROOM ONLY DURING PRE OP AND PROCEDURE DAY OF SURGERY IF YOU ARE GOING HOME AFTER SURGERY. IF YOU ARE SPENDING THE NIGHT 2 PEOPLE MAY VISIT WITH YOU IN YOUR PRIVATE ROOM AFTER SURGERY UNTIL VISITING  HOURS ARE OVER AT 800 PM AND THE 2 VISITORS CANNOT SPEND THE NIGHT.                  Tish Men    Your procedure is scheduled on: 04/20/21   Report to [facility] Main  Entrance   Report to admitting at  7:15 AM     Call this number if you have problems the morning of surgery [phone]    Remember: Do not eat food  :After Midnight the night before your surgery,     You may have clear liquids from midnight until 6:30    [place] Excluded  Coffee and tea, regular and decaf   No Milk or creamer                      liquids that you cannot  Plain Jell-O any favor except red or purple                                           see through such as: Fruit ices (not with fruit pulp)                                                                   milk, soups, orange juice  Iced Popsicles                                                                 All solid food Carbonated beverages, regular and diet                                    Cranberry, grape and apple juices Sports drinks like Gatorade Lightly seasoned clear broth or consume(fat free) Sugar     BRUSH YOUR TEETH MORNING OF SURGERY AND RINSE YOUR MOUTH OUT, NO CHEWING GUM CANDY OR MINTS.     Take these medicines the morning of surgery with A SIP OF WATER: Amlodipine  DO NOT TAKE ANY DIABETIC MEDICATIONS DAY OF YOUR SURGERY  How to Manage Your Diabetes Before and After Surgery  Why is it important to control my blood sugar before and after surgery? Improving blood sugar levels before and after surgery helps  healing and can limit problems. A way of improving blood sugar control is eating a healthy diet by:  Eating less sugar and carbohydrates  Increasing activity/exercise  Talking with your doctor about reaching your blood sugar goals High blood sugars (greater than 180 mg/dL) can raise your risk of infections and slow your recovery, so you will need to focus on controlling your diabetes during the weeks before surgery. Make sure that the doctor who takes care of your diabetes knows about your planned surgery including the date and location.  How do I manage my blood sugar before surgery? Check your blood sugar at least 4 times a day, starting 2 days before surgery, to make sure that the level is not too high or low. Check your blood sugar the morning of your surgery when you wake up and every 2 hours until you get to the Short Stay unit. If your blood sugar is less than 70 mg/dL, you will need to treat for low blood sugar: Do not take insulin. Treat a low blood sugar (less than 70 mg/dL) with  cup of clear juice (cranberry or apple), 4 glucose tablets, OR glucose gel. Recheck blood sugar in 15 minutes after treatment (to make sure it is greater than 70 mg/dL). If your blood sugar is not greater than 70 mg/dL on recheck, call [phone] for further instructions. Report your blood sugar to the short stay nurse when you get to Short Stay.  If you are admitted to the hospital after surgery: Your blood sugar will be checked by the staff and you will probably be given insulin after surgery (instead of oral diabetes medicines) to make sure you have good blood sugar levels. The goal for blood sugar control after surgery is 80-180 mg/dL.   WHAT DO I DO ABOUT MY DIABETES MEDICATION?  Do not take oral diabetes medicines (pills) the morning of surgery.                               You may not have any metal on your body including               piercings  Do not wear jewelry, lotions, powders or   deodorant             Men may shave face and neck.   Do not bring valuables to the hospital. [place].  Contacts, dentures or bridgework may not be worn into surgery.     Patients discharged the day of surgery will not be allowed to drive home.   IF YOU ARE HAVING SURGERY AND GOING HOME THE SAME DAY, YOU MUST HAVE AN ADULT TO DRIVE YOU HOME AND BE WITH YOU FOR 24 HOURS.   YOU MAY GO HOME BY TAXI OR UBER OR ORTHERWISE, BUT AN ADULT MUST ACCOMPANY YOU HOME AND STAY WITH YOU FOR 24 HOURS.  Name and phone number of your driver:  Special Instructions: N/A              Please read over the following fact sheets you were given: _____________________________________________________________________  [place] - Preparing for Surgery Before surgery, you can play an important role.  Because skin is not sterile, your skin needs to be as free of germs as possible.  You can reduce the number of germs on your skin by washing with CHG (chlorahexidine gluconate) soap before surgery.  CHG is an antiseptic cleaner which kills germs and bonds with the skin to continue killing germs even after washing. Please DO NOT use if you have an allergy to CHG or antibacterial soaps.  If your skin becomes reddened/irritated stop using the CHG and inform your nurse when you arrive at Short Stay..  You may shave your face/neck. Please follow these instructions carefully:  1.  Shower with CHG Soap the night before surgery and the  morning of Surgery.  2.  If you choose to wash your hair, wash your hair first as usual with your  normal  shampoo.  3.  After you shampoo, rinse your hair and body thoroughly to remove the  shampoo.                            4.  Use CHG as you would any other liquid soap.  You can apply chg directly  to the skin and wash                       Gently with a scrungie or clean washcloth.  5.  Apply the CHG Soap to your body ONLY FROM  THE NECK DOWN.   Do not use on face/ open                           Wound or open sores. Avoid contact with eyes, ears mouth and genitals (private parts).                       Wash face,  Genitals (private parts) with your normal soap.             6.  Wash thoroughly, paying special attention to the area where your surgery  will be performed.  7.  Thoroughly rinse your body with warm water from the neck down.  8.  DO NOT shower/wash with your normal soap after using and rinsing off  the CHG Soap.             9.  Pat yourself dry with a clean towel.            10.  Wear clean pajamas.            11.  Place clean sheets on your bed the night of your first shower and do not  sleep with pets. Day of Surgery : Do not apply any lotions/deodorants the morning of surgery.  Please wear clean clothes to the hospital/surgery center.  FAILURE TO FOLLOW THESE INSTRUCTIONS MAY RESULT IN THE CANCELLATION OF YOUR SURGERY PATIENT SIGNATURE_________________________________  NURSE SIGNATURE__________________________________  ________________________________________________________________________

## 2021-04-07 ENCOUNTER — Encounter (HOSPITAL_COMMUNITY)
Admission: RE | Admit: 2021-04-07 | Discharge: 2021-04-07 | Disposition: A | Discharge status: Home / Self Care | Payer: Medicare HMO | Source: Ambulatory Visit | Attending: Surgery | Admitting: Surgery

## 2021-04-07 ENCOUNTER — Other Ambulatory Visit: Payer: Self-pay

## 2021-04-07 ENCOUNTER — Encounter (HOSPITAL_COMMUNITY): Payer: Self-pay

## 2021-04-07 DIAGNOSIS — Z79899 Other long term (current) drug therapy: Secondary | ICD-10-CM | POA: Insufficient documentation

## 2021-04-07 DIAGNOSIS — E210 Primary hyperparathyroidism: Secondary | ICD-10-CM | POA: Diagnosis not present

## 2021-04-07 DIAGNOSIS — I10 Essential (primary) hypertension: Secondary | ICD-10-CM | POA: Insufficient documentation

## 2021-04-07 DIAGNOSIS — Z7984 Long term (current) use of oral hypoglycemic drugs: Secondary | ICD-10-CM | POA: Insufficient documentation

## 2021-04-07 DIAGNOSIS — Z7901 Long term (current) use of anticoagulants: Secondary | ICD-10-CM | POA: Diagnosis not present

## 2021-04-07 DIAGNOSIS — Z01812 Encounter for preprocedural laboratory examination: Secondary | ICD-10-CM | POA: Insufficient documentation

## 2021-04-07 DIAGNOSIS — E118 Type 2 diabetes mellitus with unspecified complications: Secondary | ICD-10-CM | POA: Insufficient documentation

## 2021-04-07 LAB — BASIC METABOLIC PANEL
Anion gap: 5 (ref 5–15)
BUN: 12 mg/dL (ref 8–23)
CO2: 25 mmol/L (ref 22–32)
Calcium: 11.1 mg/dL — ABNORMAL HIGH (ref 8.9–10.3)
Chloride: 109 mmol/L (ref 98–111)
Creatinine, Ser: 0.99 mg/dL (ref 0.61–1.24)
GFR, Estimated: >60 mL/min (ref >60)
Glucose, Bld: 110 mg/dL — ABNORMAL HIGH (ref 70–99)
Potassium: 4.1 mmol/L (ref 3.5–5.1)
Sodium: 139 mmol/L (ref 135–145)

## 2021-04-07 LAB — CBC
HCT: 45.4 % (ref 39.0–52.0)
Hemoglobin: 15.4 g/dL (ref 13.0–17.0)
MCH: 30.9 pg (ref 26.0–34.0)
MCHC: 33.9 g/dL (ref 30.0–36.0)
MCV: 91 fL (ref 80.0–100.0)
Platelets: 249 10*3/uL (ref 150–400)
RBC: 4.99 MIL/uL (ref 4.22–5.81)
RDW: 13.6 % (ref 11.5–15.5)
WBC: 5.5 10*3/uL (ref 4.0–10.5)
nRBC: 0 % (ref 0.0–0.2)

## 2021-04-07 LAB — HEMOGLOBIN A1C
Hgb A1c MFr Bld: 7.6 % — ABNORMAL HIGH (ref 4.8–5.6)
Mean Plasma Glucose: 171.42 mg/dL

## 2021-04-07 LAB — GLUCOSE, CAPILLARY: Glucose-Capillary: 116 mg/dL — ABNORMAL HIGH (ref 70–99)

## 2021-04-07 NOTE — Progress Notes (Signed)
COVID test Completed:NA   PCP - Dr. Diamantina Monks LOV 03/14/21 Cardiologist - Dr. Geanie Berlin  Chest x-ray - no EKG - 03/17/21- requested Stress Test - no ECHO - no Cardiac Cath - no Pacemaker/ICD device last checked:NA  Sleep Study - no CPAP -   Fasting Blood Sugar - 70-86 Checks Blood Sugar _BID____ times a day  Blood Thinner Instructions:NA Aspirin Instructions: Last Dose:  Anesthesia review: yes  Patient denies shortness of breath, fever, cough and chest pain at PAT appointment yes Pt has no Sob with any activities.   Patient verbalized understanding of instructions that were given to them at the PAT appointment. Patient was also instructed that they will need to review over the PAT instructions again at home before surgery. yes

## 2021-04-10 ENCOUNTER — Encounter (HOSPITAL_COMMUNITY): Payer: Self-pay | Admitting: Physician Assistant

## 2021-04-11 NOTE — Progress Notes (Signed)
Anesthesia Chart Review   Case: [id number] Date/Time: 04/20/21 0915   Procedure: RIGHT INFERIOR PARATHYROIDECTOMY (Right)   Anesthesia type: General   Pre-op diagnosis: PRIMARY HYPERPARATHYROIDISM   Location: [facility]OM 04 / [facility]   Surgeons: Armandina Gemma, MD       DISCUSSION:64 y.o. never smoker with h/o HTN, DM II, prostate cancer, primary hyperparathyroidism scheduled for above procedure 04/20/2021 with Dr. Armandina Gemma.   Pt seen by cardiology 03/17/2021 for preoperative evaluation.  Per OV note Echo and stress test ordered. Echo completed 03/23/21 with EF 70%, no valvular disease.  Stress test has not been completed at this time.   Stress test will need to be completed prior to surgery, discussed with Dr. Gala Lewandowsky triage nurse.  VS: Pulse (!) 7   Temp 36.9 C (Oral)   Resp 18   Wt 108 kg   SpO2 100%   BMI 33.19 kg/m   PROVIDERS: Chesley Noon, MD is PCP   Marina Goodell, MD is Cardiologist  LABS: Labs reviewed: Acceptable for surgery. (all labs ordered are listed, but only abnormal results are displayed)  Labs Reviewed  GLUCOSE, CAPILLARY - Abnormal; Notable for the following components:      Result Value   Glucose-Capillary 116 (*)    All other components within normal limits  HEMOGLOBIN A1C - Abnormal; Notable for the following components:   Hgb A1c MFr Bld 7.6 (*)    All other components within normal limits  BASIC METABOLIC PANEL - Abnormal; Notable for the following components:   Glucose, Bld 110 (*)    Calcium 11.1 (*)    All other components within normal limits  CBC     IMAGES:   EKG: 12/24/2019 Rate 82 bpm  NSR ST & T wave abnormality, consider inferior ischemia ST & T abnormality, consider anterolateral ischemia   CV: Echo 03/23/21 Left Ventricle: Systolic function is hyperdynamic. EF: 70%.    Left Ventricle: Wall motion is normal.    Left Ventricle: Doppler parameters indicate normal diastolic function.    Tricuspid Valve: The right  ventricular systolic pressure is normal (<36  mmHg).    Left Ventricle: There is mild concentric hypertrophy.   No significant valvular disease was noted.  Echo 10/14/2013  - Left ventricle: The cavity size was normal. Wall thickness    was increased in a pattern of mild LVH. Systolic function    was normal. The estimated ejection fraction was in the    range of 55% to 60%. Wall motion was normal; there were no    regional wall motion abnormalities. Doppler parameters are    consistent with abnormal left ventricular relaxation    (grade 1 diastolic dysfunction).  - Aortic valve: There was no stenosis.  - Mitral valve: No significant regurgitation.  - Left atrium: The atrium was mildly dilated.  - Right ventricle: The cavity size was normal. Systolic    function was normal.  - Pulmonary arteries: No complete TR doppler jet so unable    to estimate PA systolic pressure.  - Inferior vena cava: The vessel was normal in size; the    respirophasic diameter changes were in the normal range (=    50%); findings are consistent with normal central venous    pressure.  Past Medical History:  Diagnosis Date   Erectile dysfunction    Grade I diastolic dysfunction 56/25/6389   Noted on ECHO   History of kidney stones 08/18/2018   7-8 mm left UVJ stone with left hydronephrosis  and hydroureter.   History of prostate cancer urologist-  dr Orpah Melter   dx 05/ 2015 -- moderate risk Stage T2c, Gleason 3+4,  vol 152.41cc---  09/ 2015 s/p  radical prostatectomy -- per pt last PSA 07-15-2016  undectable   Hydronephrosis 08/18/2018   7-8 mm left UVJ stone with left hydronephrosis and hydroureter.   Hypertension, essential    Difficult control abdominal pain medications.   LVH (left ventricular hypertrophy) 10/14/2013   Mild, Noted on ECHO   Nephrolithiasis    Obese    Gained 40 pounds during COVID-19 isolation-lack of exercise   Right inguinal hernia    from penile prosthesis reservior  post surgery 11-19-2016   Right inguinal pain 01/03/2017   Sepsis due to Escherichia coli ([place]) 07/2018   urosepsis and E. Coli bacteremia    Spinal stenosis, lumbosacral region 03/30/2011   Type 2 diabetes mellitus [facility])     Past Surgical History:  Procedure Laterality Date   CYSTOSCOPY/URETEROSCOPY/HOLMIUM LASER/STENT PLACEMENT Left 08/18/2018   Procedure: CYSTOSCOPY/LEFT /STENT PLACEMENT;  Surgeon: Ceasar Mons, MD;  [facility];  Service: Urology;  Laterality: Left;   CYSTOSCOPY/URETEROSCOPY/HOLMIUM LASER/STENT PLACEMENT Left 09/03/2018   Procedure: CYSTOSCOPY/URETEROSCOPY/HOLMIUM LASER/STENT PLACEMENT;  Surgeon: Ceasar Mons, MD;  Location: [facility];  Service: Urology;  Laterality: Left;   INGUINAL HERNIA REPAIR Right 01/14/2017   Procedure: HERNIA REPAIR INGUINAL ADULT;  Surgeon: Cleon Gustin, MD;  Location: [facility];  Service: Urology;  Laterality: Right;   LYMPHADENECTOMY Bilateral 04/09/2014   Procedure: BILATERAL PELVIC LYMPH NODE DISSECTION;  Surgeon: Alexis Frock, MD;  [facility];  Service: Urology;  Laterality: Bilateral;   PENILE PROSTHESIS IMPLANT N/A 11/19/2016   Procedure: AMS PENILE PROTHESIS INFLATABLE;  Surgeon: Cleon Gustin, MD;  Location: [facility];  Service: Urology;  Laterality: N/A;   PENILE PROSTHESIS IMPLANT Right 01/14/2017   Procedure: REVISION PENILE PROSTHESIS;  Surgeon: Cleon Gustin, MD;  Location: [facility];  Service: Urology;  Laterality: Right;   PENILE PROSTHESIS IMPLANT N/A 02/21/2017   Procedure: PENILE PROSTHESIS REVISION;  Surgeon: Cleon Gustin, MD;  Location: [facility];  Service: Urology;  Laterality: N/A;   PROSTATE BIOPSY N/A 12/11/2013   Procedure: BIOPSY TRANSRECTAL ULTRASONIC PROSTATE (TUBP);  Surgeon: Arvil Persons, MD;  Location: [facility];  Service: Urology;  Laterality: N/A;   ROBOT  ASSISTED LAPAROSCOPIC RADICAL PROSTATECTOMY N/A 04/09/2014   Procedure: ROBOTIC ASSISTED LAPAROSCOPIC RADICAL PROSTATECTOMY, INDOCYANINE GREEN DYE ;  Surgeon: Alexis Frock, MD;  [facility];  Service: Urology;  Laterality: N/A;   TRANSTHORACIC ECHOCARDIOGRAM  10/14/2013   mild LVH/  ef  79-15%/  grade I diastolic dysfunction/  mild LAE    MEDICATIONS:  amLODipine (NORVASC) 5 MG tablet   blood glucose meter kit and supplies KIT   celecoxib (CELEBREX) 200 MG capsule   cyclobenzaprine (FLEXERIL) 10 MG tablet   EPINEPHRINE 0.3 mg/0.3 mL IJ SOAJ injection   glipiZIDE (GLUCOTROL) 10 MG tablet   latanoprost (XALATAN) 0.005 % ophthalmic solution   metFORMIN (GLUCOPHAGE) 1000 MG tablet   rosuvastatin (CRESTOR) 20 MG tablet   No current facility-administered medications for this encounter.     Konrad Felix Ward, PA-C [facility] [phone]

## 2021-04-18 ENCOUNTER — Telehealth: Payer: Self-pay

## 2021-04-18 NOTE — Telephone Encounter (Signed)
   [facility] Pre-operative Risk Assessment    Patient Name: Derrick Walker  DOB: 1955-10-16 MRN: [id number]  HEARTCARE STAFF:  - IMPORTANT!!!!!! Under Visit Info/Reason for Call, type in Other and utilize the format Clearance MM/DD/YY or Clearance TBD. Do not use dashes or single digits. - Please review there is not already an duplicate clearance open for this procedure. - If request is for dental extraction, please clarify the # of teeth to be extracted. - If the patient is currently at the dentist's office, call Pre-Op Callback Staff (MA/nurse) to input urgent request.  - If the patient is not currently in the dentist office, please route to the Pre-Op pool.  Request for surgical clearance:  What type of surgery is being performed? Right Parathyroidectomy  When is this surgery scheduled? TBD  What type of clearance is required (medical clearance vs. Pharmacy clearance to hold med vs. Both)? MEDICAL  Are there any medications that need to be held prior to surgery and how long? N/A  Practice name and name of physician performing surgery? [facility]   What is the office phone number? [phone]   7.   What is the office fax number? [phone]  8.   Anesthesia type (None, local, MAC, general) ? General    Daryus Sowash B Amaar Oshita 04/18/2021, 7:39 AM  _________________________________________________________________   (provider comments below)

## 2021-04-18 NOTE — Telephone Encounter (Signed)
I left a message for the patient to return my call to schedule an appointment for pre-op clearance.

## 2021-04-18 NOTE — Telephone Encounter (Signed)
Patient has an appointment with Derrick Walker on 10/28 at 11 am. Appointment notes have been updated and will route to requesting surgeon's office via fax. I will remove from pre-op pool.

## 2021-04-18 NOTE — Telephone Encounter (Signed)
   Name: Derrick Walker  DOB: [birth year]  MRN: [id number]  Primary Cardiologist: Glenetta Hew, MD  Chart reviewed as part of pre-operative protocol coverage. Because of [name]'s past medical history and time since last visit, he will require a follow-up visit in order to better assess preoperative cardiovascular risk.  Pre-op covering staff: - Please schedule appointment and call patient to inform them. If patient already had an upcoming appointment within acceptable timeframe, please add "pre-op clearance" to the appointment notes so provider is aware. - Please contact requesting surgeon's office via preferred method (i.e, phone, fax) to inform them of need for appointment prior to surgery.  If applicable, this message will also be routed to pharmacy pool and/or primary cardiologist for input on holding anticoagulant/antiplatelet agent as requested below so that this information is available to the clearing provider at time of patient's appointment.   Kathyrn Drown, NP  04/18/2021, 8:07 AM

## 2021-04-20 ENCOUNTER — Ambulatory Visit (HOSPITAL_COMMUNITY): Admission: RE | Admit: 2021-04-20 | Payer: Medicare HMO | Source: Home / Self Care | Admitting: Surgery

## 2021-04-20 ENCOUNTER — Encounter (HOSPITAL_COMMUNITY): Admission: RE | Payer: Self-pay | Source: Home / Self Care

## 2021-04-20 SURGERY — PARATHYROIDECTOMY
Anesthesia: General | Laterality: Right

## 2021-05-17 ENCOUNTER — Telehealth: Payer: Self-pay | Admitting: Cardiology

## 2021-05-17 NOTE — Telephone Encounter (Signed)
Patient was calling in to say that he will be at the appt on Friday the 28th. Please advise

## 2021-05-17 NOTE — Telephone Encounter (Signed)
Patient was just calling to verify his appointment.

## 2021-05-19 ENCOUNTER — Ambulatory Visit (INDEPENDENT_AMBULATORY_CARE_PROVIDER_SITE_OTHER): Payer: Medicare HMO | Admitting: Family

## 2021-05-19 ENCOUNTER — Other Ambulatory Visit: Payer: Self-pay

## 2021-05-19 ENCOUNTER — Encounter (HOSPITAL_BASED_OUTPATIENT_CLINIC_OR_DEPARTMENT_OTHER): Payer: Self-pay | Admitting: Family

## 2021-05-19 VITALS — BP 130/94 | HR 76 | Ht 69.0 in | Wt 252.4 lb

## 2021-05-19 DIAGNOSIS — Z01818 Encounter for other preprocedural examination: Secondary | ICD-10-CM

## 2021-05-19 DIAGNOSIS — R9431 Abnormal electrocardiogram [ECG] [EKG]: Secondary | ICD-10-CM

## 2021-05-19 DIAGNOSIS — I10 Essential (primary) hypertension: Secondary | ICD-10-CM

## 2021-05-19 DIAGNOSIS — I517 Cardiomegaly: Secondary | ICD-10-CM

## 2021-05-19 NOTE — Progress Notes (Signed)
Office Visit    Patient Name: Derrick Walker Date of Encounter: 05/19/2021  PCP:  Chesley Noon, MD   [place]  Cardiologist:  Glenetta Hew, MD  Advanced Practice Provider:  No care team member to display Electrophysiologist:  None   Chief Complaint    Derrick Walker is a 65 y.o. male with a hx of prostate cancer, hypertension, LVH  presents today for preop clearance   Past Medical History    Past Medical History:  Diagnosis Date   Erectile dysfunction    Grade I diastolic dysfunction 49/17/9150   Noted on ECHO   History of kidney stones 08/18/2018   7-8 mm left UVJ stone with left hydronephrosis and hydroureter.   History of prostate cancer urologist-  dr Orpah Melter   dx 05/ 2015 -- moderate risk Stage T2c, Gleason 3+4,  vol 152.41cc---  09/ 2015 s/p  radical prostatectomy -- per pt last PSA 07-15-2016  undectable   Hydronephrosis 08/18/2018   7-8 mm left UVJ stone with left hydronephrosis and hydroureter.   Hypertension, essential    Difficult control abdominal pain medications.   LVH (left ventricular hypertrophy) 10/14/2013   Mild, Noted on ECHO   Nephrolithiasis    Obese    Gained 40 pounds during COVID-19 isolation-lack of exercise   Right inguinal hernia    from penile prosthesis reservior post surgery 11-19-2016   Right inguinal pain 01/03/2017   Sepsis due to Escherichia coli ([place]) 07/2018   urosepsis and E. Coli bacteremia    Spinal stenosis, lumbosacral region 03/30/2011   Type 2 diabetes mellitus [facility])    Past Surgical History:  Procedure Laterality Date   CYSTOSCOPY/URETEROSCOPY/HOLMIUM LASER/STENT PLACEMENT Left 08/18/2018   Procedure: CYSTOSCOPY/LEFT /STENT PLACEMENT;  Surgeon: Ceasar Mons, MD;  [facility];  Service: Urology;  Laterality: Left;   CYSTOSCOPY/URETEROSCOPY/HOLMIUM LASER/STENT PLACEMENT Left 09/03/2018   Procedure: CYSTOSCOPY/URETEROSCOPY/HOLMIUM LASER/STENT PLACEMENT;   Surgeon: Ceasar Mons, MD;  Location: [facility];  Service: Urology;  Laterality: Left;   INGUINAL HERNIA REPAIR Right 01/14/2017   Procedure: HERNIA REPAIR INGUINAL ADULT;  Surgeon: Cleon Gustin, MD;  Location: [facility];  Service: Urology;  Laterality: Right;   LYMPHADENECTOMY Bilateral 04/09/2014   Procedure: BILATERAL PELVIC LYMPH NODE DISSECTION;  Surgeon: Alexis Frock, MD;  [facility];  Service: Urology;  Laterality: Bilateral;   PENILE PROSTHESIS IMPLANT N/A 11/19/2016   Procedure: AMS PENILE PROTHESIS INFLATABLE;  Surgeon: Cleon Gustin, MD;  Location: [facility];  Service: Urology;  Laterality: N/A;   PENILE PROSTHESIS IMPLANT Right 01/14/2017   Procedure: REVISION PENILE PROSTHESIS;  Surgeon: Cleon Gustin, MD;  Location: [facility];  Service: Urology;  Laterality: Right;   PENILE PROSTHESIS IMPLANT N/A 02/21/2017   Procedure: PENILE PROSTHESIS REVISION;  Surgeon: Cleon Gustin, MD;  Location: [facility];  Service: Urology;  Laterality: N/A;   PROSTATE BIOPSY N/A 12/11/2013   Procedure: BIOPSY TRANSRECTAL ULTRASONIC PROSTATE (TUBP);  Surgeon: Arvil Persons, MD;  Location: [facility];  Service: Urology;  Laterality: N/A;   ROBOT ASSISTED LAPAROSCOPIC RADICAL PROSTATECTOMY N/A 04/09/2014   Procedure: ROBOTIC ASSISTED LAPAROSCOPIC RADICAL PROSTATECTOMY, INDOCYANINE GREEN DYE ;  Surgeon: Alexis Frock, MD;  [facility];  Service: Urology;  Laterality: N/A;   TRANSTHORACIC ECHOCARDIOGRAM  10/14/2013   mild LVH/  ef  56-97%/  grade I diastolic dysfunction/  mild LAE   Allergies  Allergies  Allergen Reactions   Shellfish Allergy Anaphylaxis   Oxycodone Hives    Able to take percocet without problems   Lisinopril Other (See Comments)    Pt states gives him spasm in chest   Losartan Other (See Comments)    Kidney pain, leg swelling    History of  Present Illness    Derrick Walker is a 65 y.o. male with a hx of prostate cancer, hypertension, LVH last seen 12/2019 by Dr. Ellyn Hack.  He was seen 03/17/21 by [facility] cardiology for preop for low back surgery. Testing was ordered. Coreg added due to elevated BP. He had abnormal EKG which was thought to be due to uncontrolled hypertension. He had echo 03/23/21 with LVEF 70% and no significant valvular abnormalities. Myoview 04/28/21 with now evidence of inducible ischemia, LVEF 63%, evidence of scarring. Low risk study  He presents today for preop request for right parathyroidectomy with [facility]. Tells me he is established with [facility] and prefers to follow with them. Tells me his home BP target is 140/90 per [facility] and we discussed optimal BP goal of <130/80. Reports no shortness of breath nor dyspnea on exertion. Reports no chest pain, pressure, or tightness. No edema, orthopnea, PND. Reports no palpitations.    EKGs/Labs/Other Studies Reviewed:   The following studies were reviewed today:  NM Heart Spect Ph Stress at [facility] 04/28/21 IMPRESSION:  -No evidence of inducible ischemia.  -Small size basal inferior and mid inferior fixed defect suggestive of small scar.  -Small to moderate size mild intensity basal inferolateral, mid inferolateral and basal anterolateral fixed defect secondary to diaphragmatic attenuation and the equivocal for small scar.  -Diaphragmatic attenuation noted at rest and after Lexiscan infusion and could decrease specificity of this study.  -Prognostically this is a low risk scan. Clinical correlation is recommended.  - Left ventricular ejection fraction of 63%. . Borderline dilated LV. LV end-diastolic volume -964 ml, end-systolic volume -44 ml.  - Normal left ventricular systolic wall motion.   Electronically Signed by: Marina Goodell on 04/28/2021 1:10 PM  Echo 03/23/21 Left Ventricle: Systolic function is hyperdynamic. EF: 70%.    Left  Ventricle: Wall motion is normal.    Left Ventricle: Doppler parameters indicate normal diastolic function.    Tricuspid Valve: The right ventricular systolic pressure is normal (<36  mmHg).   Left Ventricle: There is mild concentric hypertrophy.   No significant valvular disease was noted. Narrative  This result has an attachment that is not available.    Left Ventricle  Left ventricle size is normal. There is mild concentric hypertrophy. Systolic function is hyperdynamic. EF: 70%. Wall motion is normal. Doppler parameters indicate normal diastolic function.   Right Ventricle  Right ventricle is normal. Systolic function is normal.   Left Atrium  Left atrium is normal in size.   Right Atrium  Normal sized right atrium.   IVC/SVC  The inferior vena cava demonstrates a diameter of >2.1 cm and collapses >50%; therefore, the right atrial pressure is estimated at 8 mmHg.   Mitral Valve  Mitral valve structure is normal. The leaflets are not thickened and exhibit normal excursion. There is trace regurgitation. There is no evidence of mitral valve stenosis.   Tricuspid Valve  Tricuspid valve structure is normal. The leaflets are not thickened and exhibit normal excursion. There is trace regurgitation. TR jet velocity 1.2 m/sec. There is no evidence of tricuspid valve stenosis. The right ventricular systolic pressure is normal (<36 mmHg).  Aortic Valve  The aortic valve is tricuspid. The leaflets are not thickened and exhibit normal excursion. There is no regurgitation or stenosis.   Pulmonic Valve  The pulmonic valve was not well visualized. No regurgitation present on the pulmonic valveNo pulmonic stenosis present.   Ascending Aorta  The aortic root is normal in size.   Pericardium  There is no pericardial effusion.   EKG:  EKG is ordered today.  The ekg ordered today demonstrates NSR 76 bpm with LVH with repolarization abnormality. He has stable TWI noted to V2-V6 consistent  with LVH repolarization abnormality.   Recent Labs: 04/07/2021: BUN 12; Creatinine, Ser 0.99; Hemoglobin 15.4; Platelets 249; Potassium 4.1; Sodium 139  Recent Lipid Panel    Component Value Date/Time   CHOL 171 03/05/2019 0848   TRIG 121.0 03/05/2019 0848   HDL 43.40 03/05/2019 0848   CHOLHDL 4 03/05/2019 0848   VLDL 24.2 03/05/2019 0848   LDLCALC 103 (H) 03/05/2019 0848    Home Medications   Current Meds  Medication Sig   amLODipine (NORVASC) 5 MG tablet Take 5 mg by mouth daily.   blood glucose meter kit and supplies KIT Inject 1 each into the skin 4 (four) times daily. Dispense based on patient and insurance preference. Dx: E11.9   carvedilol (COREG) 3.125 MG tablet Take 3.125 mg by mouth 2 (two) times daily.   celecoxib (CELEBREX) 200 MG capsule Take 200 mg by mouth 2 (two) times daily.   EPINEPHRINE 0.3 mg/0.3 mL IJ SOAJ injection INJECT 0.3 MLS INTO THE MUSCLE ONCE FOR 1 DOSE (Patient taking differently: Inject 0.3 mg into the muscle as needed for anaphylaxis.)   EQ ASPIRIN ADULT LOW DOSE 81 MG EC tablet Take 81 mg by mouth daily.   glipiZIDE (GLUCOTROL) 10 MG tablet Take 10 mg by mouth 2 (two) times daily before a meal.   latanoprost (XALATAN) 0.005 % ophthalmic solution Place 1 drop into both eyes at bedtime.   metFORMIN (GLUCOPHAGE) 1000 MG tablet TAKE 1 TABLET BY MOUTH TWICE DAILY WITH A MEAL (Patient taking differently: Take 1,000 mg by mouth 2 (two) times daily with a meal.)   olmesartan (BENICAR) 40 MG tablet Take 40 mg by mouth daily.   ONETOUCH ULTRA test strip USE 1 STRIP TO CHECK GLUCOSE 4 TIMES DAILY   rosuvastatin (CRESTOR) 20 MG tablet Take 20 mg by mouth daily.     Review of Systems      All other systems reviewed and are otherwise negative except as noted above.  Physical Exam    VS:  BP (!) 130/94   Pulse 76   Ht 5' 9" (1.753 m)   Wt 252 lb 6.4 oz (114.5 kg)   SpO2 97%   BMI 37.27 kg/m  , BMI Body mass index is 37.27 kg/m.  Wt Readings from Last  3 Encounters:  05/19/21 252 lb 6.4 oz (114.5 kg)  04/07/21 238 lb (108 kg)  12/24/19 249 lb (112.9 kg)     GEN: Well nourished, well developed, in no acute distress. HEENT: normal. Neck: Supple, no JVD, carotid bruits, or masses. Cardiac: RRR, no murmurs, rubs, or gallops. No clubbing, cyanosis, edema.  Radials/PT 2+ and equal bilaterally.  Respiratory:  Respirations regular and unlabored, clear to auscultation bilaterally. GI: Soft, nontender, nondistended. MS: No deformity or atrophy. Skin: Warm and dry, no rash. Neuro:  Strength and sensation are intact. Psych: Normal affect.  Assessment & Plan    Preop clearance -he had Lexiscan Myoview 04/2021 at  [facility] cardiology which was low risk study. According to the Revised Cardiac Risk Index (RCRI), his Perioperative Risk of Major Cardiac Event is (%): 0.4. His  Functional Capacity in METs is: 6.36 according to the Duke Activity Status Index (DASI).  He is deemed acceptable risk for the planned procedure and may proceed without additional cardiovascular testing.  Will route to surgical team so they are aware.  LVH / Abnormal EKG -known LVH with changes suggestive of LVH and repolarization by EKG.  Stable compared to previous.  Recommend optimal blood pressure control.  He reports no anginal symptoms and there is no indication for ischemic evaluation.  Lexiscan Myoview 04/2021 low risk study.  HTN - BP mildly elevated. Encouraged home monitoring. Will defer changes to [facility] cardiology as he prefers to follow with their office.  Disposition: Follow up as needed with Dr. Ellyn Hack  Signed, Loel Dubonnet, NP 05/19/2021, 2:17 PM [place]

## 2021-05-19 NOTE — Patient Instructions (Addendum)
Medication Instructions:  None ordered today.   *If you need a refill on your cardiac medications before your next appointment, please call your pharmacy*  Lab Work: None ordered today.   Testing/Procedures: Your EKG today was stable compared to previous.   Follow-Up: At [facility], you and your health needs are our priority.  As part of our continuing mission to provide you with exceptional heart care, we have created designated Provider Care Teams.  These Care Teams include your primary Cardiologist (physician) and Advanced Practice Providers (APPs -  Physician Assistants and Nurse Practitioners) who all work together to provide you with the care you need, when you need it.  We recommend signing up for the patient portal called "MyChart".  Sign up information is provided on this After Visit Summary.  MyChart is used to connect with patients for Virtual Visits (Telemedicine).  Patients are able to view lab/test results, encounter notes, upcoming appointments, etc.  Non-urgent messages can be sent to your provider as well.   To learn more about what you can do with MyChart, go to [email or web address].    Your next appointment:   As needed with Dr. Ellyn Hack   Other Instructions  Loel Dubonnet, NP will send a note to [facility] that you are cleared for surgery.   To prevent or reduce lower extremity swelling: Eat a low salt diet. Salt makes the body hold onto extra fluid which causes swelling. Sit with legs elevated. For example, in the recliner or on an [place].  Wear knee-high compression stockings during the daytime. Ones labeled 15-20 mmHg provide good compression.   Heart Healthy Diet Recommendations: A low-salt diet is recommended. Meats should be grilled, baked, or boiled. Avoid fried foods. Focus on lean protein sources like fish or chicken with vegetables and fruits. The American Heart Association is a Microbiologist!  American Heart Association Diet and  Lifeystyle Recommendations      Exercise recommendations: The American Heart Association recommends 150 minutes of moderate intensity exercise weekly. Try 30 minutes of moderate intensity exercise 4-5 times per week. This could include walking, jogging, or swimming.

## 2021-05-23 NOTE — Progress Notes (Addendum)
Anesthesia Review:  PCP: DR Anastasia Pall  LOV 04/27/21  Cardiologist :05/22/21- Reuel Derby Saliaswiii Chest x-ray : EKG  03/17/21 on chart  and 05/19/21 in epic  Echo : Stress test: Cardiac Cath :  Activity level: can do a flight of stairs without difficulty  Sleep Study/ CPAP : none  Fasting Blood Sugar :      / Checks Blood Sugar -- times a day:   Blood Thinner/ Instructions /Last Dose: ASA / Instructions/ Last Dose :   81 mg Aspirin  04/07/21-hgba1c- 7.6 Hgba1c- 05/29/21- 7.0- routed to DR T Gerkin  along with 2V CSR 05/29/21.  DM- type 2 checks glucose twice daily  No covid test- ambulatory surgery

## 2021-05-24 ENCOUNTER — Encounter: Payer: Self-pay | Admitting: Surgery

## 2021-05-24 ENCOUNTER — Ambulatory Visit: Payer: Self-pay | Admitting: Surgery

## 2021-05-24 DIAGNOSIS — E210 Primary hyperparathyroidism: Secondary | ICD-10-CM | POA: Diagnosis present
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
## 2021-05-24 NOTE — Progress Notes (Signed)
Sent message, via epic in basket, requesting orders in epic from surgeon.  

## 2021-05-25 NOTE — Progress Notes (Signed)
Your procedure is scheduled on:    06/14/21   Report to [facility] Main  Entrance   Report to admitting at    1045AM     Call this number if you have problems the morning of surgery [phone]    REMEMBER: NO  SOLID FOOD CANDY OR GUM AFTER MIDNIGHT. CLEAR LIQUIDS UNTIL    1000am        . NOTHING BY MOUTH EXCEPT CLEAR LIQUIDS UNTIL    1000am   . PLEASE FINISH  G 2 Lower Sugar  DRINK PER SURGEON ORDER  WHICH NEEDS TO BE COMPLETED AT 1000am      .      CLEAR LIQUID DIET   Foods Allowed                                                                    Coffee and tea, regular and decaf                            Fruit ices (not with fruit pulp)                                      Iced Popsicles                                    Carbonated beverages, regular and diet                                    Cranberry, grape and apple juices Sports drinks like Gatorade Lightly seasoned clear broth or consume(fat free) Sugar, honey syrup ___________________________________________________________________      BRUSH YOUR TEETH MORNING OF SURGERY AND RINSE YOUR MOUTH OUT, NO CHEWING GUM CANDY OR MINTS.     Take these medicines the morning of surgery with A SIP OF WATER:  amlodipine, coreg   DO NOT TAKE ANY DIABETIC MEDICATIONS DAY OF YOUR SURGERY                               You may not have any metal on your body including hair pins and              piercings  Do not wear jewelry, make-up, lotions, powders or perfumes, deodorant             Do not wear nail polish on your fingernails.  Do not shave  48 hours prior to surgery.              Men may shave face and neck.   Do not bring valuables to the hospital. [place].  Contacts, dentures or bridgework may not be worn into surgery.  Leave suitcase in the car. After surgery it may be brought to your room.     Patients discharged the  day of surgery will not be  allowed to drive home. IF YOU ARE HAVING SURGERY AND GOING HOME THE SAME DAY, YOU MUST HAVE AN ADULT TO DRIVE YOU HOME AND BE WITH YOU FOR 24 HOURS. YOU MAY GO HOME BY TAXI OR UBER OR ORTHERWISE, BUT AN ADULT MUST ACCOMPANY YOU HOME AND STAY WITH YOU FOR 24 HOURS.  Name and phone number of your driver:  Special Instructions: N/A              Please read over the following fact sheets you were given: _____________________________________________________________________  [facility] - Preparing for Surgery Before surgery, you can play an important role.  Because skin is not sterile, your skin needs to be as free of germs as possible.  You can reduce the number of germs on your skin by washing with CHG (chlorahexidine gluconate) soap before surgery.  CHG is an antiseptic cleaner which kills germs and bonds with the skin to continue killing germs even after washing. Please DO NOT use if you have an allergy to CHG or antibacterial soaps.  If your skin becomes reddened/irritated stop using the CHG and inform your nurse when you arrive at Short Stay. Do not shave (including legs and underarms) for at least 48 hours prior to the first CHG shower.  You may shave your face/neck. Please follow these instructions carefully:  1.  Shower with CHG Soap the night before surgery and the  morning of Surgery.  2.  If you choose to wash your hair, wash your hair first as usual with your  normal  shampoo.  3.  After you shampoo, rinse your hair and body thoroughly to remove the  shampoo.                           4.  Use CHG as you would any other liquid soap.  You can apply chg directly  to the skin and wash                       Gently with a scrungie or clean washcloth.  5.  Apply the CHG Soap to your body ONLY FROM THE NECK DOWN.   Do not use on face/ open                           Wound or open sores. Avoid contact with eyes, ears mouth and genitals (private parts).                       Wash face,  Genitals  (private parts) with your normal soap.             6.  Wash thoroughly, paying special attention to the area where your surgery  will be performed.  7.  Thoroughly rinse your body with warm water from the neck down.  8.  DO NOT shower/wash with your normal soap after using and rinsing off  the CHG Soap.                9.  Pat yourself dry with a clean towel.            10.  Wear clean pajamas.            11.  Place clean sheets on your bed the night of your first shower and do not  sleep with pets. Day of  Surgery : Do not apply any lotions/deodorants the morning of surgery.  Please wear clean clothes to the hospital/surgery center.  FAILURE TO FOLLOW THESE INSTRUCTIONS MAY RESULT IN THE CANCELLATION OF YOUR SURGERY PATIENT SIGNATURE_________________________________  NURSE SIGNATURE__________________________________  ________________________________________________________________________

## 2021-05-29 ENCOUNTER — Ambulatory Visit (HOSPITAL_COMMUNITY)
Admission: RE | Admit: 2021-05-29 | Discharge: 2021-05-29 | Disposition: A | Discharge status: Home / Self Care | Payer: Medicare HMO | Source: Ambulatory Visit | Attending: Anesthesiology | Admitting: Anesthesiology

## 2021-05-29 ENCOUNTER — Encounter (HOSPITAL_COMMUNITY)
Admission: RE | Admit: 2021-05-29 | Discharge: 2021-05-29 | Disposition: A | Discharge status: Home / Self Care | Payer: Medicare HMO | Source: Ambulatory Visit | Attending: Surgery | Admitting: Surgery

## 2021-05-29 ENCOUNTER — Encounter (HOSPITAL_COMMUNITY): Payer: Self-pay

## 2021-05-29 ENCOUNTER — Other Ambulatory Visit: Payer: Self-pay

## 2021-05-29 VITALS — BP 161/91 | HR 81 | Temp 98.1°F | Resp 18 | Ht 69.5 in | Wt 249.4 lb

## 2021-05-29 DIAGNOSIS — Z01818 Encounter for other preprocedural examination: Secondary | ICD-10-CM | POA: Insufficient documentation

## 2021-05-29 DIAGNOSIS — E119 Type 2 diabetes mellitus without complications: Secondary | ICD-10-CM | POA: Diagnosis present

## 2021-05-29 LAB — BASIC METABOLIC PANEL
Anion gap: 10 (ref 5–15)
BUN: 11 mg/dL (ref 8–23)
CO2: 23 mmol/L (ref 22–32)
Calcium: 9.6 mg/dL (ref 8.9–10.3)
Chloride: 107 mmol/L (ref 98–111)
Creatinine, Ser: 0.87 mg/dL (ref 0.61–1.24)
GFR, Estimated: >60 mL/min (ref >60)
Glucose, Bld: 113 mg/dL — ABNORMAL HIGH (ref 70–99)
Potassium: 3.8 mmol/L (ref 3.5–5.1)
Sodium: 140 mmol/L (ref 135–145)

## 2021-05-29 LAB — CBC
HCT: 42.5 % (ref 39.0–52.0)
Hemoglobin: 14.3 g/dL (ref 13.0–17.0)
MCH: 30.8 pg (ref 26.0–34.0)
MCHC: 33.6 g/dL (ref 30.0–36.0)
MCV: 91.6 fL (ref 80.0–100.0)
Platelets: 235 10*3/uL (ref 150–400)
RBC: 4.64 MIL/uL (ref 4.22–5.81)
RDW: 14.1 % (ref 11.5–15.5)
WBC: 6.3 10*3/uL (ref 4.0–10.5)
nRBC: 0 % (ref 0.0–0.2)

## 2021-05-29 LAB — HEMOGLOBIN A1C
Hgb A1c MFr Bld: 7 % — ABNORMAL HIGH (ref 4.8–5.6)
Mean Plasma Glucose: 154.2 mg/dL

## 2021-05-29 LAB — GLUCOSE, CAPILLARY: Glucose-Capillary: 104 mg/dL — ABNORMAL HIGH (ref 70–99)

## 2021-06-02 NOTE — Progress Notes (Signed)
Anesthesia Chart Review   Case: [id number] Date/Time: 06/14/21 1245   Procedure: RIGHT INFERIOR PARATHYROIDECTOMY (Right)   Anesthesia type: General   Pre-op diagnosis: PRIMARY PARAHYPERTHYROIDISM   Location[facility] ROOM 01 / [facility]   Surgeons: Armandina Gemma, MD       DISCUSSION:64 y.o. never smoker with h/o HTN, DM II, prostate cancer, primary hyperparathyroidism scheduled for above procedure 06/14/2021 with Dr. Armandina Gemma.   Pt seen by cardiology 05/19/2021 for preoperative evaluation.  Per OV note, "Preop clearance -he had [place] 04/2021 at [facility] cardiology which was low risk study. According to the Revised Cardiac Risk Index (RCRI), his Perioperative Risk of Major Cardiac Event is (%): 0.4. His  Functional Capacity in METs is: 6.36 according to the Duke Activity Status Index (DASI).  He is deemed acceptable risk for the planned procedure and may proceed without additional cardiovascular testing."  Anticipate pt can proceed with planned procedure barring acute status change.   VS: BP (!) 161/91   Pulse 81   Temp 36.7 C (Oral)   Resp 18   Ht 5' 9.5" (1.765 m)   Wt 113.1 kg   SpO2 98%   BMI 36.30 kg/m   PROVIDERS: Chesley Noon, MD is PCP   Cardiologist:  Glenetta Hew, MD   LABS: Labs reviewed: Acceptable for surgery. (all labs ordered are listed, but only abnormal results are displayed)  Labs Reviewed  HEMOGLOBIN A1C - Abnormal; Notable for the following components:      Result Value   Hgb A1c MFr Bld 7.0 (*)    All other components within normal limits  BASIC METABOLIC PANEL - Abnormal; Notable for the following components:   Glucose, Bld 113 (*)    All other components within normal limits  GLUCOSE, CAPILLARY - Abnormal; Notable for the following components:   Glucose-Capillary 104 (*)    All other components within normal limits  CBC     IMAGES:   EKG: 05/19/2021 Rate 76 bpm  NSR  CV: NM Heart Spect Ph Stress at [facility] 04/28/21 IMPRESSION:   -No evidence of inducible ischemia.  -Small size basal inferior and mid inferior fixed defect suggestive of small scar.  -Small to moderate size mild intensity basal inferolateral, mid inferolateral and basal anterolateral fixed defect secondary to diaphragmatic attenuation and the equivocal for small scar.  -Diaphragmatic attenuation noted at rest and after Lexiscan infusion and could decrease specificity of this study.  -Prognostically this is a low risk scan. Clinical correlation is recommended.  - Left ventricular ejection fraction of 63%. . Borderline dilated LV. LV end-diastolic volume -383 ml, end-systolic volume -44 ml.  - Normal left ventricular systolic wall motion.    Echo 10/14/2013 - Left ventricle: The cavity size was normal. Wall thickness    was increased in a pattern of mild LVH. Systolic function    was normal. The estimated ejection fraction was in the    range of 55% to 60%. Wall motion was normal; there were no    regional wall motion abnormalities. Doppler parameters are    consistent with abnormal left ventricular relaxation    (grade 1 diastolic dysfunction).  - Aortic valve: There was no stenosis.  - Mitral valve: No significant regurgitation.  - Left atrium: The atrium was mildly dilated.  - Right ventricle: The cavity size was normal. Systolic    function was normal.  - Pulmonary arteries: No complete TR doppler jet so unable    to estimate PA systolic pressure.  -  Inferior vena cava: The vessel was normal in size; the    respirophasic diameter changes were in the normal range (=    50%); findings are consistent with normal central venous    pressure.  Past Medical History:  Diagnosis Date   Erectile dysfunction    Grade I diastolic dysfunction 14/23/9532   Noted on ECHO   History of kidney stones 08/18/2018   7-8 mm left UVJ stone with left hydronephrosis and hydroureter.   History of prostate cancer urologist-  dr Orpah Melter   dx 05/ 2015 --  moderate risk Stage T2c, Gleason 3+4,  vol 152.41cc---  09/ 2015 s/p  radical prostatectomy -- per pt last PSA 07-15-2016  undectable   Hypertension, essential    Difficult control abdominal pain medications.   LVH (left ventricular hypertrophy) 10/14/2013   Mild, Noted on ECHO   Obese    Gained 40 pounds during COVID-19 isolation-lack of exercise   Right inguinal hernia    from penile prosthesis reservior post surgery 11-19-2016   Right inguinal pain 01/03/2017   Sepsis due to Escherichia coli ([place]) 07/2018   urosepsis and E. Coli bacteremia    Spinal stenosis, lumbosacral region 03/30/2011   Type 2 diabetes mellitus [facility])     Past Surgical History:  Procedure Laterality Date   CYSTOSCOPY/URETEROSCOPY/HOLMIUM LASER/STENT PLACEMENT Left 08/18/2018   Procedure: CYSTOSCOPY/LEFT /STENT PLACEMENT;  Surgeon: Ceasar Mons, MD;  Location[facility];  Service: Urology;  Laterality: Left;   CYSTOSCOPY/URETEROSCOPY/HOLMIUM LASER/STENT PLACEMENT Left 09/03/2018   Procedure: CYSTOSCOPY/URETEROSCOPY/HOLMIUM LASER/STENT PLACEMENT;  Surgeon: Ceasar Mons, MD;  Location: [facility];  Service: Urology;  Laterality: Left;   INGUINAL HERNIA REPAIR Right 01/14/2017   Procedure: HERNIA REPAIR INGUINAL ADULT;  Surgeon: Cleon Gustin, MD;  Location: [facility];  Service: Urology;  Laterality: Right;   LYMPHADENECTOMY Bilateral 04/09/2014   Procedure: BILATERAL PELVIC LYMPH NODE DISSECTION;  Surgeon: Alexis Frock, MD;  [facility];  Service: Urology;  Laterality: Bilateral;   PENILE PROSTHESIS IMPLANT N/A 11/19/2016   Procedure: AMS PENILE PROTHESIS INFLATABLE;  Surgeon: Cleon Gustin, MD;  Location: [facility];  Service: Urology;  Laterality: N/A;   PENILE PROSTHESIS IMPLANT Right 01/14/2017   Procedure: REVISION PENILE PROSTHESIS;  Surgeon: Cleon Gustin, MD;  Location: [facility];  Service: Urology;   Laterality: Right;   PENILE PROSTHESIS IMPLANT N/A 02/21/2017   Procedure: PENILE PROSTHESIS REVISION;  Surgeon: Cleon Gustin, MD;  Location: [facility];  Service: Urology;  Laterality: N/A;   PROSTATE BIOPSY N/A 12/11/2013   Procedure: BIOPSY TRANSRECTAL ULTRASONIC PROSTATE (TUBP);  Surgeon: Arvil Persons, MD;  Location: [facility];  Service: Urology;  Laterality: N/A;   right trigger thumb surgery      ROBOT ASSISTED LAPAROSCOPIC RADICAL PROSTATECTOMY N/A 04/09/2014   Procedure: ROBOTIC ASSISTED LAPAROSCOPIC RADICAL PROSTATECTOMY, INDOCYANINE GREEN DYE ;  Surgeon: Alexis Frock, MD;  [facility];  Service: Urology;  Laterality: N/A;   TRANSTHORACIC ECHOCARDIOGRAM  10/14/2013   mild LVH/  ef  02-33%/  grade I diastolic dysfunction/  mild LAE    MEDICATIONS:  blood glucose meter kit and supplies KIT   carvedilol (COREG) 3.125 MG tablet   celecoxib (CELEBREX) 200 MG capsule   EPINEPHRINE 0.3 mg/0.3 mL IJ SOAJ injection   EQ ASPIRIN ADULT LOW DOSE 81 MG EC tablet   glipiZIDE (GLUCOTROL XL) 10 MG 24 hr tablet   latanoprost (XALATAN) 0.005 %  ophthalmic solution   metFORMIN (GLUCOPHAGE) 1000 MG tablet   olmesartan (BENICAR) 40 MG tablet   ONETOUCH ULTRA test strip   rosuvastatin (CRESTOR) 20 MG tablet   No current facility-administered medications for this encounter.     Konrad Felix Ward, PA-C [facility] [phone]

## 2021-06-02 NOTE — Anesthesia Preprocedure Evaluation (Addendum)
Anesthesia Evaluation  Patient identified by MRN, date of birth, ID band Patient awake    Reviewed: Allergy & Precautions, NPO status , Patient's Chart, lab work & pertinent test results, reviewed documented beta blocker date and time   History of Anesthesia Complications Negative for: history of anesthetic complications  Airway Mallampati: II  TM Distance: >3 FB Neck ROM: Full    Dental  (+) Dental Advisory Given, Teeth Intact   Pulmonary neg pulmonary ROS,    Pulmonary exam normal        Cardiovascular hypertension, Pt. on medications and Pt. on home beta blockers Normal cardiovascular exam     Neuro/Psych negative neurological ROS  negative psych ROS   GI/Hepatic negative GI ROS, Neg liver ROS,   Endo/Other  diabetes, Type 2, Oral Hypoglycemic Agents Obesity   Renal/GU negative Renal ROS    Prostate cancer     Musculoskeletal negative musculoskeletal ROS (+)   Abdominal   Peds  Hematology negative hematology ROS (+)   Anesthesia Other Findings See PAT note  Reproductive/Obstetrics                           Anesthesia Physical Anesthesia Plan  ASA: 2  Anesthesia Plan: General   Post-op Pain Management:    Induction: Intravenous  PONV Risk Score and Plan: 2 and Treatment may vary due to age or medical condition, Ondansetron, Dexamethasone and Midazolam  Airway Management Planned: Oral ETT  Additional Equipment: None  Intra-op Plan:   Post-operative Plan: Extubation in OR  Informed Consent: I have reviewed the patients History and Physical, chart, labs and discussed the procedure including the risks, benefits and alternatives for the proposed anesthesia with the patient or authorized representative who has indicated his/her understanding and acceptance.     Dental advisory given  Plan Discussed with: CRNA and Anesthesiologist  Anesthesia Plan Comments:        Anesthesia Quick Evaluation

## 2021-06-11 ENCOUNTER — Encounter (HOSPITAL_COMMUNITY): Payer: Self-pay | Admitting: Surgery

## 2021-06-14 ENCOUNTER — Encounter (HOSPITAL_COMMUNITY)
Admission: RE | Disposition: A | Discharge status: Home / Self Care | Payer: Self-pay | Source: Ambulatory Visit | Attending: Surgery

## 2021-06-14 ENCOUNTER — Ambulatory Visit (HOSPITAL_COMMUNITY): Payer: Medicare HMO | Admitting: Certified Registered Nurse Anesthetist

## 2021-06-14 ENCOUNTER — Ambulatory Visit (HOSPITAL_COMMUNITY)
Admission: RE | Admit: 2021-06-14 | Discharge: 2021-06-14 | Disposition: A | Discharge status: Home / Self Care | Payer: Medicare HMO | Source: Ambulatory Visit | Attending: Surgery | Admitting: Surgery

## 2021-06-14 ENCOUNTER — Encounter (HOSPITAL_COMMUNITY): Payer: Self-pay | Admitting: Surgery

## 2021-06-14 ENCOUNTER — Ambulatory Visit (HOSPITAL_COMMUNITY): Payer: Medicare HMO | Admitting: Physician Assistant

## 2021-06-14 DIAGNOSIS — E210 Primary hyperparathyroidism: Secondary | ICD-10-CM | POA: Insufficient documentation

## 2021-06-14 DIAGNOSIS — E669 Obesity, unspecified: Secondary | ICD-10-CM | POA: Insufficient documentation

## 2021-06-14 DIAGNOSIS — D351 Benign neoplasm of parathyroid gland: Secondary | ICD-10-CM | POA: Insufficient documentation

## 2021-06-14 DIAGNOSIS — Z7984 Long term (current) use of oral hypoglycemic drugs: Secondary | ICD-10-CM | POA: Diagnosis not present

## 2021-06-14 DIAGNOSIS — I10 Essential (primary) hypertension: Secondary | ICD-10-CM | POA: Insufficient documentation

## 2021-06-14 DIAGNOSIS — Z6836 Body mass index (BMI) 36.0-36.9, adult: Secondary | ICD-10-CM | POA: Insufficient documentation

## 2021-06-14 DIAGNOSIS — Z8546 Personal history of malignant neoplasm of prostate: Secondary | ICD-10-CM | POA: Insufficient documentation

## 2021-06-14 DIAGNOSIS — E119 Type 2 diabetes mellitus without complications: Secondary | ICD-10-CM | POA: Diagnosis not present

## 2021-06-14 HISTORY — PX: PARATHYROIDECTOMY: SHX19

## 2021-06-14 LAB — GLUCOSE, CAPILLARY
Glucose-Capillary: 119 mg/dL — ABNORMAL HIGH (ref 70–99)
Glucose-Capillary: 137 mg/dL — ABNORMAL HIGH (ref 70–99)

## 2021-06-14 SURGERY — PARATHYROIDECTOMY
Anesthesia: General | Laterality: Right

## 2021-06-14 MED ORDER — 0.9 % SODIUM CHLORIDE (POUR BTL) OPTIME
TOPICAL | Status: DC | PRN
  Administered 2021-06-14: 1000 mL

## 2021-06-14 MED ORDER — PHENYLEPHRINE HCL-NACL 20-0.9 MG/250ML-% IV SOLN
INTRAVENOUS | Status: DC | PRN
  Administered 2021-06-14: 30 ug/min via INTRAVENOUS

## 2021-06-14 MED ORDER — FENTANYL CITRATE (PF) 100 MCG/2ML IJ SOLN
INTRAMUSCULAR | Status: AC
  Filled 2021-06-14: qty 2

## 2021-06-14 MED ORDER — DEXMEDETOMIDINE HCL IN NACL 200 MCG/50ML IV SOLN
INTRAVENOUS | Status: DC | PRN
  Administered 2021-06-14: 12 ug via INTRAVENOUS

## 2021-06-14 MED ORDER — PROPOFOL 10 MG/ML IV BOLUS
INTRAVENOUS | Status: DC | PRN
  Administered 2021-06-14: 200 mg via INTRAVENOUS

## 2021-06-14 MED ORDER — ESMOLOL HCL 100 MG/10ML IV SOLN
INTRAVENOUS | Status: AC
  Filled 2021-06-14: qty 10

## 2021-06-14 MED ORDER — BUPIVACAINE HCL 0.25 % IJ SOLN
INTRAMUSCULAR | Status: AC
  Filled 2021-06-14: qty 1

## 2021-06-14 MED ORDER — LABETALOL HCL 5 MG/ML IV SOLN: 5.0000 mg | INTRAVENOUS | Status: DC | PRN

## 2021-06-14 MED ORDER — LIDOCAINE HCL (PF) 2 % IJ SOLN
INTRAMUSCULAR | Status: AC
  Filled 2021-06-14: qty 5

## 2021-06-14 MED ORDER — CHLORHEXIDINE GLUCONATE 0.12 % MT SOLN
15.0000 mL | Freq: Once | OROMUCOSAL | Status: AC
  Administered 2021-06-14: 15 mL via OROMUCOSAL

## 2021-06-14 MED ORDER — PROPOFOL 10 MG/ML IV BOLUS
INTRAVENOUS | Status: AC
  Filled 2021-06-14: qty 20

## 2021-06-14 MED ORDER — FENTANYL CITRATE PF 50 MCG/ML IJ SOSY
PREFILLED_SYRINGE | INTRAMUSCULAR | Status: AC
  Filled 2021-06-14: qty 3

## 2021-06-14 MED ORDER — ESMOLOL HCL 100 MG/10ML IV SOLN
INTRAVENOUS | Status: DC | PRN
  Administered 2021-06-14: 30 mg via INTRAVENOUS

## 2021-06-14 MED ORDER — MIDAZOLAM HCL 5 MG/5ML IJ SOLN
INTRAMUSCULAR | Status: DC | PRN
Start: 2021-06-14 — End: 2021-06-14
  Administered 2021-06-14: 2 mg via INTRAVENOUS

## 2021-06-14 MED ORDER — LABETALOL HCL 5 MG/ML IV SOLN
INTRAVENOUS | Status: AC
  Filled 2021-06-14: qty 4

## 2021-06-14 MED ORDER — CHLORHEXIDINE GLUCONATE CLOTH 2 % EX PADS: 6.0000 | MEDICATED_PAD | Freq: Once | CUTANEOUS | Status: DC

## 2021-06-14 MED ORDER — TRAMADOL HCL 50 MG PO TABS: 50.0000 mg | ORAL_TABLET | Freq: Four times a day (QID) | ORAL | 0 refills | Status: DC | PRN

## 2021-06-14 MED ORDER — PROMETHAZINE HCL 25 MG/ML IJ SOLN: 6.2500 mg | INTRAMUSCULAR | Status: DC | PRN

## 2021-06-14 MED ORDER — ROCURONIUM BROMIDE 100 MG/10ML IV SOLN
INTRAVENOUS | Status: DC | PRN
  Administered 2021-06-14: 20 mg via INTRAVENOUS
  Administered 2021-06-14: 60 mg via INTRAVENOUS

## 2021-06-14 MED ORDER — FENTANYL CITRATE (PF) 100 MCG/2ML IJ SOLN
INTRAMUSCULAR | Status: DC | PRN
  Administered 2021-06-14: 50 ug via INTRAVENOUS
  Administered 2021-06-14: 100 ug via INTRAVENOUS
  Administered 2021-06-14: 50 ug via INTRAVENOUS

## 2021-06-14 MED ORDER — ORAL CARE MOUTH RINSE: 15.0000 mL | Freq: Once | OROMUCOSAL | Status: AC

## 2021-06-14 MED ORDER — SUGAMMADEX SODIUM 200 MG/2ML IV SOLN
INTRAVENOUS | Status: DC | PRN
  Administered 2021-06-14: 400 mg via INTRAVENOUS

## 2021-06-14 MED ORDER — LIDOCAINE HCL (CARDIAC) PF 100 MG/5ML IV SOSY
PREFILLED_SYRINGE | INTRAVENOUS | Status: DC | PRN
  Administered 2021-06-14: 60 mg via INTRAVENOUS

## 2021-06-14 MED ORDER — CEFAZOLIN SODIUM-DEXTROSE 2-4 GM/100ML-% IV SOLN
2.0000 g | INTRAVENOUS | Status: AC
  Administered 2021-06-14: 2 g via INTRAVENOUS
  Filled 2021-06-14: qty 100

## 2021-06-14 MED ORDER — HEMOSTATIC AGENTS (NO CHARGE) OPTIME
TOPICAL | Status: DC | PRN
  Administered 2021-06-14: 1 via TOPICAL

## 2021-06-14 MED ORDER — LABETALOL HCL 5 MG/ML IV SOLN
INTRAVENOUS | Status: DC | PRN
  Administered 2021-06-14: 5 mg via INTRAVENOUS

## 2021-06-14 MED ORDER — DEXAMETHASONE SODIUM PHOSPHATE 10 MG/ML IJ SOLN
INTRAMUSCULAR | Status: DC | PRN
  Administered 2021-06-14: 5 mg via INTRAVENOUS

## 2021-06-14 MED ORDER — LACTATED RINGERS IV SOLN: INTRAVENOUS | Status: DC

## 2021-06-14 MED ORDER — ONDANSETRON HCL 4 MG/2ML IJ SOLN
INTRAMUSCULAR | Status: DC | PRN
  Administered 2021-06-14: 4 mg via INTRAVENOUS

## 2021-06-14 MED ORDER — MIDAZOLAM HCL 2 MG/2ML IJ SOLN
INTRAMUSCULAR | Status: AC
  Filled 2021-06-14: qty 2

## 2021-06-14 MED ORDER — ONDANSETRON HCL 4 MG/2ML IJ SOLN
INTRAMUSCULAR | Status: AC
  Filled 2021-06-14: qty 2

## 2021-06-14 MED ORDER — ACETAMINOPHEN 500 MG PO TABS
1000.0000 mg | ORAL_TABLET | Freq: Once | ORAL | Status: AC
  Administered 2021-06-14: 1000 mg via ORAL
  Filled 2021-06-14: qty 2

## 2021-06-14 MED ORDER — BUPIVACAINE HCL 0.25 % IJ SOLN
INTRAMUSCULAR | Status: DC | PRN
  Administered 2021-06-14: 13 mL

## 2021-06-14 MED ORDER — FENTANYL CITRATE PF 50 MCG/ML IJ SOSY
25.0000 ug | PREFILLED_SYRINGE | INTRAMUSCULAR | Status: DC | PRN
Start: 2021-06-14 — End: 2021-06-14
  Administered 2021-06-14 (×2): 50 ug via INTRAVENOUS

## 2021-06-14 SURGICAL SUPPLY — 31 items
ATTRACTOMAT 16X20 MAGNETIC DRP (DRAPES) ×2 IMPLANT
BAG COUNTER SPONGE SURGICOUNT (BAG) ×2 IMPLANT
BLADE SURG 15 STRL LF DISP TIS (BLADE) ×1 IMPLANT
BLADE SURG 15 STRL SS (BLADE) ×2
CHLORAPREP W/TINT 26 (MISCELLANEOUS) ×2 IMPLANT
CLIP TI MEDIUM 6 (CLIP) ×4 IMPLANT
CLIP TI WIDE RED SMALL 6 (CLIP) ×4 IMPLANT
COVER SURGICAL LIGHT HANDLE (MISCELLANEOUS) ×2 IMPLANT
DERMABOND ADVANCED (GAUZE/BANDAGES/DRESSINGS) ×1
DERMABOND ADVANCED .7 DNX12 (GAUZE/BANDAGES/DRESSINGS) ×1 IMPLANT
DRAPE LAPAROTOMY T 98X78 PEDS (DRAPES) ×2 IMPLANT
DRAPE UTILITY XL STRL (DRAPES) ×2 IMPLANT
ELECT REM PT RETURN 15FT ADLT (MISCELLANEOUS) ×2 IMPLANT
GAUZE 4X4 16PLY ~~LOC~~+RFID DBL (SPONGE) ×2 IMPLANT
GLOVE SURG SYN 7.5  E (GLOVE) ×4
GLOVE SURG SYN 7.5 E (GLOVE) ×2 IMPLANT
GOWN STRL REUS W/TWL XL LVL3 (GOWN DISPOSABLE) ×6 IMPLANT
HEMOSTAT SURGICEL 2X4 FIBR (HEMOSTASIS) ×2 IMPLANT
ILLUMINATOR WAVEGUIDE N/F (MISCELLANEOUS) IMPLANT
KIT BASIN OR (CUSTOM PROCEDURE TRAY) ×2 IMPLANT
KIT TURNOVER KIT A (KITS) IMPLANT
NEEDLE HYPO 25X1 1.5 SAFETY (NEEDLE) ×2 IMPLANT
PACK BASIC VI WITH GOWN DISP (CUSTOM PROCEDURE TRAY) ×2 IMPLANT
PENCIL SMOKE EVACUATOR (MISCELLANEOUS) ×2 IMPLANT
SUT MNCRL AB 4-0 PS2 18 (SUTURE) ×2 IMPLANT
SUT VIC AB 3-0 SH 18 (SUTURE) ×2 IMPLANT
SYR BULB IRRIG 60ML STRL (SYRINGE) ×2 IMPLANT
SYR CONTROL 10ML LL (SYRINGE) ×2 IMPLANT
TOWEL OR 17X26 10 PK STRL BLUE (TOWEL DISPOSABLE) ×2 IMPLANT
TOWEL OR NON WOVEN STRL DISP B (DISPOSABLE) ×2 IMPLANT
TUBING CONNECTING 10 (TUBING) ×2 IMPLANT
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
## 2021-06-14 NOTE — Interval H&P Note (Signed)
History and Physical Interval Note:  06/14/2021 12:22 PM  Derrick Walker  has presented today for surgery, with the diagnosis of PRIMARY PARAHYPERTHYROIDISM.  The various methods of treatment have been discussed with the patient and family. After consideration of risks, benefits and other options for treatment, the patient has consented to    Procedure(s): RIGHT INFERIOR PARATHYROIDECTOMY (Right) as a surgical intervention.    The patient's history has been reviewed, patient examined, no change in status, stable for surgery.  I have reviewed the patient's chart and labs.  Questions were answered to the patient's satisfaction.    Armandina Gemma, [facility] [place] practice Office: [place]

## 2021-06-14 NOTE — Anesthesia Postprocedure Evaluation (Signed)
Anesthesia Post Note  Patient: Derrick Walker  Procedure(s) Performed: RIGHT INFERIOR PARATHYROIDECTOMY (Right)     Patient location during evaluation: PACU Anesthesia Type: General Level of consciousness: awake and alert Pain management: pain level controlled Vital Signs Assessment: post-procedure vital signs reviewed and stable Respiratory status: spontaneous breathing, nonlabored ventilation and respiratory function stable Cardiovascular status: stable and blood pressure returned to baseline Anesthetic complications: no   No notable events documented.  Last Vitals:  Vitals:   06/14/21 1438 06/14/21 1445  BP:  (!) 165/97  Pulse: (!) 59 71  Resp: 11 18  Temp:    SpO2: 91% 98%    Last Pain:  Vitals:   06/14/21 1438  TempSrc:   PainSc: [place]

## 2021-06-14 NOTE — Discharge Instructions (Signed)
[facility] - Dr. Josee Speece  THYROID & PARATHYROID SURGERY:  POST-OP INSTRUCTIONS  Always review the instruction sheet provided by the hospital nurse at discharge.  A prescription for pain medication may be sent to your pharmacy at the time of discharge.  Take your pain medication as prescribed.  If narcotic pain medicine is not needed, then you may take acetaminophen (Tylenol) or ibuprofen (Advil) as needed for pain or soreness.  Take your normal home medications as prescribed unless otherwise directed.  If you need a refill on your pain medication, please contact the office during regular business hours.  Prescriptions will not be processed by the office after 5:00PM or on weekends.  Start with a light diet upon arrival home, such as soup and crackers or toast.  Be sure to drink plenty of fluids.  Resume your normal diet the day after surgery.  Most patients will experience some swelling and bruising on the chest and neck area.  Ice packs will help for the first 48 hours after arriving home.  Swelling and bruising will take several days to resolve.   It is common to experience some constipation after surgery.  Increasing fluid intake and taking a stool softener (Colace) will usually help to prevent this problem.  A mild laxative (Milk of Magnesia or Miralax) should be taken according to package directions if there has been no bowel movement after 48 hours.  Dermabond glue covers your incision. This seals the wound and you may shower at any time. The Dermabond will remain in place for about a week.  You may gradually remove the glue when it loosens around the edges.  If you need to loosen the Dermabond for removal, apply a layer of Vaseline to the wound for 15 minutes and then remove with a Kleenex. Your sutures are under the skin and will not show - they will dissolve on their own.  You may resume light daily activities beginning the day after discharge (such as self-care,  walking, climbing stairs), gradually increasing activities as tolerated. You may have sexual intercourse when it is comfortable. Refrain from any heavy lifting or straining until approved by your doctor. You may drive when you no longer are taking prescription pain medication, you can comfortably wear a seatbelt, and you can safely maneuver your car and apply the brakes.  You will see your doctor in the office for a follow-up appointment approximately three weeks after your surgery.  Make sure that you call for this appointment within a day or two after you arrive home to insure a convenient appointment time. Please have any requested laboratory tests performed a few days prior to your office visit so that the results will be available at your follow up appointment.  WHEN TO CALL THE [facility] OFFICE: -- Fever greater than 101.5 -- Inability to urinate -- Nausea and/or vomiting - persistent -- Extreme swelling or bruising -- Continued bleeding from incision -- Increased pain, redness, or drainage from the incision -- Difficulty swallowing or breathing -- Muscle cramping or spasms -- Numbness or tingling in hands or around lips  The clinic staff is available to answer your questions during regular business hours.  Please don't hesitate to call and ask to speak to one of the nurses if you have concerns.  [facility] OFFICE: [phone] (24 hours)  Please sign up for MyChart accounts. This will allow you to communicate directly with my nurse or myself without having to call the office. It will also allow you   to view your test results. You will need to enroll in MyChart for my office ([facility]) and for the hospital ([facility]).  Treyshon Buchanon, MD [facility] A [facility] practice 

## 2021-06-14 NOTE — Transfer of Care (Signed)
Immediate Anesthesia Transfer of Care Note  Patient: Derrick Walker  Procedure(s) Performed: RIGHT INFERIOR PARATHYROIDECTOMY (Right)  Patient Location: PACU  Anesthesia Type:General  Level of Consciousness: awake, alert  and oriented  Airway & Oxygen Therapy: Patient Spontanous Breathing and Patient connected to nasal cannula oxygen  Post-op Assessment: Report given to RN and Post -op Vital signs reviewed and stable  Post vital signs: Reviewed and stable  Last Vitals:  Vitals Value Taken Time  BP 124/75 06/14/21 1404  Temp    Pulse 78 06/14/21 1407  Resp 19 06/14/21 1407  SpO2 93 % 06/14/21 1407  Vitals shown include unvalidated device data.  Last Pain:  Vitals:   06/14/21 1112  TempSrc:   PainSc: 8       Patients Stated Pain Goal: 7 (35/32/99 2426)  Complications: No notable events documented.

## 2021-06-14 NOTE — Anesthesia Procedure Notes (Signed)
Procedure Name: Intubation Date/Time: 06/14/2021 12:50 PM Performed by: Elaina Pattee, CRNA Pre-anesthesia Checklist: Patient identified, Emergency Drugs available, Suction available and Patient being monitored Patient Re-evaluated:Patient Re-evaluated prior to induction Oxygen Delivery Method: Circle system utilized Preoxygenation: Pre-oxygenation with 100% oxygen Induction Type: IV induction Ventilation: Mask ventilation without difficulty Laryngoscope Size: Mac and 4 Grade View: Grade II Tube type: Oral Tube size: 7.5 mm Number of attempts: 1 Airway Equipment and Method: Stylet and Oral airway Placement Confirmation: ETT inserted through vocal cords under direct vision, positive ETCO2 and breath sounds checked- equal and bilateral Tube secured with: Tape Dental Injury: Teeth and Oropharynx as per pre-operative assessment

## 2021-06-15 ENCOUNTER — Encounter (HOSPITAL_COMMUNITY): Payer: Self-pay | Admitting: Surgery

## 2021-06-16 LAB — SURGICAL PATHOLOGY

## 2021-06-29 DIAGNOSIS — E892 Postprocedural hypoparathyroidism: Secondary | ICD-10-CM | POA: Insufficient documentation

## 2021-07-04 ENCOUNTER — Ambulatory Visit: Payer: Self-pay | Admitting: Orthopedic Surgery

## 2021-07-04 DIAGNOSIS — Z01818 Encounter for other preprocedural examination: Secondary | ICD-10-CM

## 2021-07-10 ENCOUNTER — Other Ambulatory Visit: Payer: Self-pay

## 2021-07-18 ENCOUNTER — Ambulatory Visit (INDEPENDENT_AMBULATORY_CARE_PROVIDER_SITE_OTHER): Payer: Medicare HMO | Admitting: Vascular Surgery

## 2021-07-18 ENCOUNTER — Other Ambulatory Visit: Payer: Self-pay

## 2021-07-18 ENCOUNTER — Encounter: Payer: Self-pay | Admitting: Vascular Surgery

## 2021-07-18 DIAGNOSIS — M549 Dorsalgia, unspecified: Secondary | ICD-10-CM | POA: Insufficient documentation

## 2021-07-18 DIAGNOSIS — M545 Low back pain, unspecified: Secondary | ICD-10-CM

## 2021-07-18 DIAGNOSIS — G8929 Other chronic pain: Secondary | ICD-10-CM | POA: Diagnosis not present

## 2021-07-18 NOTE — Progress Notes (Deleted)
Patient name: Derrick Walker MRN: [id number] DOB: [birth year] Sex: male  REASON FOR CONSULT: ***  HPI: Derrick Walker is a 65 y.o. male, ***  Past Medical History:  Diagnosis Date   Erectile dysfunction    Grade I diastolic dysfunction 14/48/1856   Noted on ECHO   History of kidney stones 08/18/2018   7-8 mm left UVJ stone with left hydronephrosis and hydroureter.   History of prostate cancer urologist-  dr Derrick Walker   dx 05/ 2015 -- moderate risk Stage T2c, Gleason 3+4,  vol 152.41cc---  09/ 2015 s/p  radical prostatectomy -- per pt last PSA 07-15-2016  undectable   Hypertension, essential    Difficult control abdominal pain medications.   LVH (left ventricular hypertrophy) 10/14/2013   Mild, Noted on ECHO   Obese    Gained 40 pounds during COVID-19 isolation-lack of exercise   Right inguinal hernia    from penile prosthesis reservior post surgery 11-19-2016   Right inguinal pain 01/03/2017   Sepsis due to Escherichia coli ([place]) 07/2018   urosepsis and E. Coli bacteremia    Spinal stenosis, lumbosacral region 03/30/2011   Type 2 diabetes mellitus [facility])     Past Surgical History:  Procedure Laterality Date   CYSTOSCOPY/URETEROSCOPY/HOLMIUM LASER/STENT PLACEMENT Left 08/18/2018   Procedure: CYSTOSCOPY/LEFT /STENT PLACEMENT;  Surgeon: Derrick Mons, MD;  Location: [facility];  Service: Urology;  Laterality: Left;   CYSTOSCOPY/URETEROSCOPY/HOLMIUM LASER/STENT PLACEMENT Left 09/03/2018   Procedure: CYSTOSCOPY/URETEROSCOPY/HOLMIUM LASER/STENT PLACEMENT;  Surgeon: Derrick Mons, MD;  Location[facility];  Service: Urology;  Laterality: Left;   INGUINAL HERNIA REPAIR Right 01/14/2017   Procedure: HERNIA REPAIR INGUINAL ADULT;  Surgeon: Derrick Gustin, MD;  Location: [facility];  Service: Urology;  Laterality: Right;   LYMPHADENECTOMY Bilateral 04/09/2014   Procedure: BILATERAL PELVIC LYMPH NODE DISSECTION;   Surgeon: Derrick Frock, MD;  Location: [facility];  Service: Urology;  Laterality: Bilateral;   PARATHYROIDECTOMY Right 06/14/2021   Procedure: RIGHT INFERIOR PARATHYROIDECTOMY;  Surgeon: Derrick Gemma, MD;  Location: [facility];  Service: General;  Laterality: Right;   PENILE PROSTHESIS IMPLANT N/A 11/19/2016   Procedure: AMS PENILE PROTHESIS INFLATABLE;  Surgeon: Derrick Gustin, MD;  Location: [facility];  Service: Urology;  Laterality: N/A;   PENILE PROSTHESIS IMPLANT Right 01/14/2017   Procedure: REVISION PENILE PROSTHESIS;  Surgeon: Derrick Gustin, MD;  Location: [facility];  Service: Urology;  Laterality: Right;   PENILE PROSTHESIS IMPLANT N/A 02/21/2017   Procedure: PENILE PROSTHESIS REVISION;  Surgeon: Derrick Gustin, MD;  Location: [facility];  Service: Urology;  Laterality: N/A;   PROSTATE BIOPSY N/A 12/11/2013   Procedure: BIOPSY TRANSRECTAL ULTRASONIC PROSTATE (TUBP);  Surgeon: Derrick Persons, MD;  Location[facility];  Service: Urology;  Laterality: N/A;   right trigger thumb surgery      ROBOT ASSISTED LAPAROSCOPIC RADICAL PROSTATECTOMY N/A 04/09/2014   Procedure: ROBOTIC ASSISTED LAPAROSCOPIC RADICAL PROSTATECTOMY, INDOCYANINE GREEN DYE ;  Surgeon: Derrick Frock, MD;  Location: [facility];  Service: Urology;  Laterality: N/A;   TRANSTHORACIC ECHOCARDIOGRAM  10/14/2013   mild LVH/  ef  31-49%/  grade I diastolic dysfunction/  mild LAE    Family History  Problem Relation Age of Onset   Hypertension Mother    Hypertension Maternal Grandmother    Hypertension Paternal Grandmother    Kidney failure Father    Hypertension Brother    Colon cancer Neg  Hx    Colon polyps Neg Hx    Esophageal cancer Neg Hx    Rectal cancer Neg Hx    Stomach cancer Neg Hx     SOCIAL HISTORY: Social History   Socioeconomic History   Marital status: Single    Spouse name: Not on file   Number of children: 3   Years of education:  Not on file   Highest education level: Not on file  Occupational History    Employer: [employer]  Tobacco Use   Smoking status: Never   Smokeless tobacco: Never  Vaping Use   Vaping Use: Never used  Substance and Sexual Activity   Alcohol use: Never    Alcohol/week: 7.0 standard drinks    Types: 7 Standard drinks or equivalent per week   Drug use: Never   Sexual activity: Not on file  Other Topics Concern   Not on file  Social History Narrative   He is a divorced father of 3. 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        He never smoked. He drinks about 3 glasses of wine with food a week. He uses to help digest food.   He is Jewish and into [employer] does not eat any pork/ham and only a small amount of beef.   Contact information:   Son: Derrick Walker: [phone]   Daughter: Derrick Walker: [phone]   Social Determinants of Health   Financial Resource Strain: Not on file  Food Insecurity: Not on file  Transportation Needs: Not on file  Physical Activity: Not on file  Stress: Not on file  Social Connections: Not on file  Intimate Partner Violence: Not on file    Allergies  Allergen Reactions   Amlodipine Swelling    Hands swell up Severe swelling of hands   Shellfish Allergy Anaphylaxis   Oxycodone Hives    Able to take percocet without problems   Lisinopril Other (See Comments)    Pt states gives him spasm in chest   Losartan Swelling    Kidney pain, leg swelling    Current Outpatient Medications  Medication Sig Dispense Refill   blood glucose meter kit and supplies KIT Inject 1 each into the skin 4 (four) times daily. Dispense based on patient and insurance preference. Dx: E11.9 1 each 0   carvedilol (COREG) 3.125 MG tablet Take 3.125 mg by mouth 2 (two) times daily.     EPINEPHRINE 0.3 mg/0.3 mL IJ SOAJ injection INJECT 0.3 MLS INTO THE MUSCLE ONCE FOR 1 DOSE (Patient taking  differently: Inject 0.3 mg into the muscle as needed for anaphylaxis.) 2 each 0   EQ ASPIRIN ADULT LOW DOSE 81 MG EC tablet Take 81 mg by mouth daily.     glipiZIDE (GLUCOTROL XL) 10 MG 24 hr tablet Take 10 mg by mouth 2 (two) times daily.     hydrALAZINE (APRESOLINE) 25 MG tablet Take 12.5 mg by mouth 2 (two) times daily.     latanoprost (XALATAN) 0.005 % ophthalmic solution Place one drop into both eyes at bedtime.     metFORMIN (GLUCOPHAGE) 1000 MG tablet Take by mouth.     olmesartan (BENICAR) 40 MG tablet Take 40 mg by mouth daily.     ONETOUCH ULTRA test strip USE 1 STRIP TO CHECK GLUCOSE 4 TIMES DAILY     rosuvastatin (CRESTOR) 20 MG tablet Take 20 mg by mouth daily.  celecoxib (CELEBREX) 200 MG capsule Take 200 mg by mouth 2 (two) times daily. (Patient not taking: Reported on 07/18/2021)     traMADol (ULTRAM) 50 MG tablet Take 1-2 tablets (50-100 mg total) by mouth every 6 (six) hours as needed for moderate pain. (Patient not taking: Reported on 07/18/2021) 15 tablet 0   No current facility-administered medications for this visit.    REVIEW OF SYSTEMS:  _0  denotes positive finding, _1  denotes negative finding Cardiac  Comments:  Chest pain or chest pressure: ***   Shortness of breath upon exertion:    Short of breath when lying flat:    Irregular heart rhythm:        Vascular    Pain in calf, thigh, or hip brought on by ambulation:    Pain in feet at night that wakes you up from your sleep:     Blood clot in your veins:    Leg swelling:         Pulmonary    Oxygen at home:    Productive cough:     Wheezing:         Neurologic    Sudden weakness in arms or legs:     Sudden numbness in arms or legs:     Sudden onset of difficulty speaking or slurred speech:    Temporary loss of vision in one eye:     Problems with dizziness:         Gastrointestinal    Blood in stool:     Vomited blood:         Genitourinary    Burning when urinating:     Blood in urine:         Psychiatric    Major depression:         Hematologic    Bleeding problems:    Problems with blood clotting too easily:        Skin    Rashes or ulcers:        Constitutional    Fever or chills:      PHYSICAL EXAM: Vitals:   07/18/21 0933  BP: (!) 155/85  Pulse: 76  Resp: 18  Temp: 98.3 F (36.8 C)  TempSrc: Temporal  SpO2: 97%  Weight: 241 lb (109.3 kg)  Height: _2  (1.778 m)    GENERAL: The patient is a well-nourished male, in no acute distress. The vital signs are documented above. CARDIAC: There is a regular rate and rhythm.  VASCULAR: *** PULMONARY: There is good air exchange bilaterally without wheezing or rales. ABDOMEN: Soft and non-tender with normal pitched bowel sounds.  MUSCULOSKELETAL: There are no major deformities or cyanosis. NEUROLOGIC: No focal weakness or paresthesias are detected. SKIN: There are no ulcers or rashes noted. PSYCHIATRIC: The patient has a normal affect.  DATA:   ***  Assessment/Plan:  ***   Marty Heck, MD [facility] Office: [phone]
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
## 2021-07-21 ENCOUNTER — Ambulatory Visit: Payer: Self-pay | Admitting: Orthopedic Surgery

## 2021-07-21 NOTE — H&P (Deleted)
  The note originally documented on this encounter has been moved the the encounter in which it belongs.  

## 2021-07-21 NOTE — H&P (Signed)
Subjective:   Original plan was for ALIF L5-S1, Due to vascular surgeons evaluation and concern for penile prosthesis reservoir we have switched the surgical plan to do a transforaminal lumbar interbody fusion at the L5-S1 level. 08/02/20 [place]  Patient Active Problem List   Diagnosis Date Noted   Back pain 07/18/2021   Status post parathyroidectomy ([place]) 06/29/2021   Primary hyperparathyroidism ([place]) 09/29/2019   Hypertension associated with type 2 diabetes mellitus ([place]) 09/25/2019   Mild obesity 09/25/2019   Venous insufficiency 09/25/2019   Hypercalcemia 03/06/2019   Hyperlipidemia associated with type 2 diabetes mellitus ([place]) 03/05/2019   History of sepsis 08/18/2018   Erectile dysfunction 11/19/2016   Uncontrolled type 2 diabetes mellitus, without long-term current use of insulin 12/15/2015   History of prostate cancer 04/09/2014   Abnormal finding on EKG 09/17/2013   LVH (left ventricular hypertrophy) - an EKG 09/17/2013   Past Medical History:  Diagnosis Date   Erectile dysfunction    Grade I diastolic dysfunction 04/88/8916   Noted on ECHO   History of kidney stones 08/18/2018   7-8 mm left UVJ stone with left hydronephrosis and hydroureter.   History of prostate cancer urologist-  dr Orpah Melter   dx 05/ 2015 -- moderate risk Stage T2c, Gleason 3+4,  vol 152.41cc---  09/ 2015 s/p  radical prostatectomy -- per pt last PSA 07-15-2016  undectable   Hypertension, essential    Difficult control abdominal pain medications.   LVH (left ventricular hypertrophy) 10/14/2013   Mild, Noted on ECHO   Obese    Gained 40 pounds during COVID-19 isolation-lack of exercise   Right inguinal hernia    from penile prosthesis reservior post surgery 11-19-2016   Right inguinal pain 01/03/2017   Sepsis due to Escherichia coli ([place]) 07/2018   urosepsis and E. Coli bacteremia    Spinal stenosis, lumbosacral region 03/30/2011   Type 2 diabetes mellitus [facility])     Past Surgical  History:  Procedure Laterality Date   CYSTOSCOPY/URETEROSCOPY/HOLMIUM LASER/STENT PLACEMENT Left 08/18/2018   Procedure: CYSTOSCOPY/LEFT /STENT PLACEMENT;  Surgeon: Ceasar Mons, MD;  Location: [facility];  Service: Urology;  Laterality: Left;   CYSTOSCOPY/URETEROSCOPY/HOLMIUM LASER/STENT PLACEMENT Left 09/03/2018   Procedure: CYSTOSCOPY/URETEROSCOPY/HOLMIUM LASER/STENT PLACEMENT;  Surgeon: Ceasar Mons, MD;  [facility];  Service: Urology;  Laterality: Left;   INGUINAL HERNIA REPAIR Right 01/14/2017   Procedure: HERNIA REPAIR INGUINAL ADULT;  Surgeon: Cleon Gustin, MD;  Location: [facility];  Service: Urology;  Laterality: Right;   LYMPHADENECTOMY Bilateral 04/09/2014   Procedure: BILATERAL PELVIC LYMPH NODE DISSECTION;  Surgeon: Alexis Frock, MD;  Location[facility];  Service: Urology;  Laterality: Bilateral;   PARATHYROIDECTOMY Right 06/14/2021   Procedure: RIGHT INFERIOR PARATHYROIDECTOMY;  Surgeon: Armandina Gemma, MD;  Location: [facility];  Service: General;  Laterality: Right;   PENILE PROSTHESIS IMPLANT N/A 11/19/2016   Procedure: AMS PENILE PROTHESIS INFLATABLE;  Surgeon: Cleon Gustin, MD;  Location: [facility];  Service: Urology;  Laterality: N/A;   PENILE PROSTHESIS IMPLANT Right 01/14/2017   Procedure: REVISION PENILE PROSTHESIS;  Surgeon: Cleon Gustin, MD;  Location[facility];  Service: Urology;  Laterality: Right;   PENILE PROSTHESIS IMPLANT N/A 02/21/2017   Procedure: PENILE PROSTHESIS REVISION;  Surgeon: Cleon Gustin, MD;  Location: [facility];  Service: Urology;  Laterality: N/A;   PROSTATE BIOPSY N/A 12/11/2013   Procedure: BIOPSY TRANSRECTAL ULTRASONIC PROSTATE (TUBP);  Surgeon: Arvil Persons,  MD;  Location: [place];  Service: Urology;  Laterality: N/A;   right trigger thumb surgery      ROBOT ASSISTED LAPAROSCOPIC RADICAL  PROSTATECTOMY N/A 04/09/2014   Procedure: ROBOTIC ASSISTED LAPAROSCOPIC RADICAL PROSTATECTOMY, INDOCYANINE GREEN DYE ;  Surgeon: Alexis Frock, MD;  Location[facility];  Service: Urology;  Laterality: N/A;   TRANSTHORACIC ECHOCARDIOGRAM  10/14/2013   mild LVH/  ef  23-55%/  grade I diastolic dysfunction/  mild LAE    Current Outpatient Medications  Medication Sig Dispense Refill Last Dose   blood glucose meter kit and supplies KIT Inject 1 each into the skin 4 (four) times daily. Dispense based on patient and insurance preference. Dx: E11.9 1 each 0    carvedilol (COREG) 3.125 MG tablet Take 3.125 mg by mouth 2 (two) times daily.      cholecalciferol (VITAMIN D3) 25 MCG (1000 UNIT) tablet Take 3,000 Units by mouth daily.      EPINEPHRINE 0.3 mg/0.3 mL IJ SOAJ injection INJECT 0.3 MLS INTO THE MUSCLE ONCE FOR 1 DOSE 2 each 0    glipiZIDE (GLUCOTROL XL) 10 MG 24 hr tablet Take 10 mg by mouth 2 (two) times daily.      hydrALAZINE (APRESOLINE) 25 MG tablet Take 12.5 mg by mouth 2 (two) times daily.      latanoprost (XALATAN) 0.005 % ophthalmic solution Place 1 drop into both eyes at bedtime.      metFORMIN (GLUCOPHAGE) 1000 MG tablet Take 1,000 mg by mouth 2 (two) times daily with a meal.      olmesartan (BENICAR) 40 MG tablet Take 40 mg by mouth daily.      ONETOUCH ULTRA test strip USE 1 STRIP TO CHECK GLUCOSE 4 TIMES DAILY      rosuvastatin (CRESTOR) 20 MG tablet Take 20 mg by mouth daily.      traMADol (ULTRAM) 50 MG tablet Take 1-2 tablets (50-100 mg total) by mouth every 6 (six) hours as needed for moderate pain. (Patient not taking: Reported on 07/18/2021) 15 tablet 0    No current facility-administered medications for this visit.   Allergies  Allergen Reactions   Amlodipine Swelling    Hands swell up Severe swelling of hands   Shellfish Allergy Anaphylaxis   Oxycodone Hives    Able to take percocet without problems   Lisinopril Other (See Comments)    Pt states gives him spasm in  chest   Losartan Swelling    Kidney pain, leg swelling    Social History   Tobacco Use   Smoking status: Never   Smokeless tobacco: Never  Substance Use Topics   Alcohol use: Never    Alcohol/week: 7.0 standard drinks    Types: 7 Standard drinks or equivalent per week    Family History  Problem Relation Age of Onset   Hypertension Mother    Hypertension Maternal Grandmother    Hypertension Paternal Grandmother    Kidney failure Father    Hypertension Brother    Colon cancer Neg Hx    Colon polyps Neg Hx    Esophageal cancer Neg Hx    Rectal cancer Neg Hx    Stomach cancer Neg Hx     Review of Systems Pertinent items are noted in HPI.  Objective:   Vitals: Ht: 5 ft 9.25 in 07/21/2021 01:38 pm BP: 140/100 07/21/2021 01:48 pm Pulse: 80 bpm 07/21/2021 01:48 pm  General: Alert and oriented 3, no apparent distress  Ambulation: Normal, no assistive devices  Heart: Regular rate and rhythm, no rubs, murmurs, or gallops  Lungs: Clear to auscultation bilaterally  Abdomen: Bowel sounds 4, nondistended, nontender, no rebound tenderness He continues to have right L5 radicular pain and dysesthesias. +3/5 EHL/tibialis anterior strength. Intact gastrocnemius strength. Patient is complaining of increasing left radicular leg pain which is producing generalized weakness. Compartments are soft and nontender. Intact peripheral pulses bilaterally.  Lumbar MRI: completed on 05/05/21 was reviewed with the patient. It was completed at [facility]; I have independently reviewed the images as well as the radiology report. Severe right foraminal narrowing at L5-S1 with compression of the right exiting L5 nerve root. Moderate left and mild to moderate right foraminal narrowing at L4-5 with mild lateral recess stenosis. There is a low-grade degenerative slip at L4-5 as well as L5-S1. These findings are significantly unchanged from his prior  Assessment:   This is a pleasant 65 year old male  with low back pain and radicular right leg pain as well as some right leg weakness. He has failed appropriate conservative care and has elected to move forward with surgical intervention. The initial surgery was scheduled to be an anterior approach, but upon seeing the vascular surgeon this approach was not recommended given his penile prosthesis reservoir. We will therefore plan to move forward with a transforaminal lumbar interbody fusion instead. This procedure was explained to the patient in detail using a model as well as a video.    Plan:   Risks and benefits of surgery were discussed with the patient. These include: Infection, bleeding, death, stroke, paralysis, ongoing or worse pain, need for additional surgery, nonunion, leak of spinal fluid, adjacent segment degeneration requiring additional fusion surgery, Injury to abdominal vessels that can require anterior surgery to stop bleeding. Malposition of the cage and/or pedicle screws that could require additional surgery. Loss of bowel and bladder control. Postoperative hematoma causing neurologic compression that could require urgent or emergent re-operation.  We have received preoperative clearance from the patient's primary care provider.  I have reviewed the patient's medication list with him. He is not on any aspirin. Not using any blood thinners. Not using any anti-inflammatories. He takes vitamin D which he will hold 1 week prior to surgery.  Patient is scheduled for LSO brace fitting with PT  We have also discussed the post-operative recovery period to include: bathing/showering restrictions, wound healing, activity (and driving) restrictions, medications/pain mangement.  We have also discussed post-operative redflags to include: signs and symptoms of postoperative infection, DVT/PE.  Discharge instructions were reviewed with the patient all of his questions were invited and answered.

## 2021-07-26 NOTE — Progress Notes (Signed)
Surgical Instructions    Your procedure is scheduled on Wednesday, January 11th.  Report to [facility] Main Entrance "A" at 6:30 A.M., then check in with the Admitting office.  Call this number if you have problems the morning of surgery:  [phone]   If you have any questions prior to your surgery date call [phone]: Open Monday-Friday 8am-4pm    Remember:  Do not eat after midnight the night before your surgery  You may drink clear liquids until 5:30 AM the morning of your surgery.   Clear liquids allowed are: Water, Non-Citrus Juices (without pulp), Carbonated Beverages, Clear Tea, Black Coffee ONLY (NO MILK, CREAM OR POWDERED CREAMER of any kind), and Gatorade    Take these medicines the morning of surgery with A SIP OF WATER Carvedilol (Coreg) Hydralazine (Apresoline) Rosuvastatin (Crestor)  If needed: Epi Pen   As of today, STOP taking any Aspirin (unless otherwise instructed by your surgeon) Aleve, Naproxen, Ibuprofen, Motrin, Advil, Goody's, BC's, all herbal medications, fish oil, and all vitamins.  WHAT DO I DO ABOUT MY DIABETES MEDICATION?   Do not take oral diabetes medicines (pills) the morning of surgery. - Glipizide, Metformin  THE NIGHT BEFORE SURGERY, do NOT take evening dose of Glipizide.      HOW TO MANAGE YOUR DIABETES BEFORE AND AFTER SURGERY  Why is it important to control my blood sugar before and after surgery? Improving blood sugar levels before and after surgery helps healing and can limit problems. A way of improving blood sugar control is eating a healthy diet by:  Eating less sugar and carbohydrates  Increasing activity/exercise  Talking with your doctor about reaching your blood sugar goals High blood sugars (greater than 180 mg/dL) can raise your risk of infections and slow your recovery, so you will need to focus on controlling your diabetes during the weeks before surgery. Make sure that the doctor who takes care of your diabetes  knows about your planned surgery including the date and location.  How do I manage my blood sugar before surgery? Check your blood sugar at least 4 times a day, starting 2 days before surgery, to make sure that the level is not too high or low.  Check your blood sugar the morning of your surgery when you wake up and every 2 hours until you get to the Short Stay unit.  If your blood sugar is less than 70 mg/dL, you will need to treat for low blood sugar: Do not take insulin. Treat a low blood sugar (less than 70 mg/dL) with  cup of clear juice (cranberry or apple), 4 glucose tablets, OR glucose gel. Recheck blood sugar in 15 minutes after treatment (to make sure it is greater than 70 mg/dL). If your blood sugar is not greater than 70 mg/dL on recheck, call [phone] for further instructions. Report your blood sugar to the short stay nurse when you get to Short Stay.  If you are admitted to the hospital after surgery: Your blood sugar will be checked by the staff and you will probably be given insulin after surgery (instead of oral diabetes medicines) to make sure you have good blood sugar levels. The goal for blood sugar control after surgery is 80-180 mg/dL.   DAY OF SURGERY:         Do not wear jewelry Do not wear lotions, powders, colognes, or deodorant. Men may shave face and neck. Do not bring valuables to the hospital.  [facility] is not responsible for any belongings or valuables.  Do NOT Smoke (Tobacco/Vaping)  24 hours prior to your procedure  If you use a CPAP at night, you may bring your mask for your overnight stay.   Contacts, glasses, hearing aids, dentures or partials may not be worn into surgery, please bring cases for these belongings   For patients admitted to the hospital, discharge time will be determined by your treatment team.   Patients discharged the day of surgery will not be allowed to drive home, and someone needs to stay with them for 24  hours.  NO VISITORS WILL BE ALLOWED IN PRE-OP WHERE PATIENTS ARE PREPPED FOR SURGERY.  ONLY 1 SUPPORT PERSON MAY BE PRESENT IN THE WAITING ROOM WHILE YOU ARE IN SURGERY.  IF YOU ARE TO BE ADMITTED, ONCE YOU ARE IN YOUR ROOM YOU WILL BE ALLOWED TWO (2) VISITORS. 1 (ONE) VISITOR MAY STAY OVERNIGHT BUT MUST ARRIVE TO THE ROOM BY 8pm.  Minor children may have two parents present. Special consideration for safety and communication needs will be reviewed on a case by case basis.  Special instructions:    Oral Hygiene is also important to reduce your risk of infection.  Remember - BRUSH YOUR TEETH THE MORNING OF SURGERY WITH YOUR REGULAR TOOTHPASTE   [facility]- Preparing For Surgery  Before surgery, you can play an important role. Because skin is not sterile, your skin needs to be as free of germs as possible. You can reduce the number of germs on your skin by washing with CHG (chlorahexidine gluconate) Soap before surgery.  CHG is an antiseptic cleaner which kills germs and bonds with the skin to continue killing germs even after washing.     Please do not use if you have an allergy to CHG or antibacterial soaps. If your skin becomes reddened/irritated stop using the CHG.  Do not shave (including legs and underarms) for at least 48 hours prior to first CHG shower. It is OK to shave your face.  Please follow these instructions carefully.     Shower the NIGHT BEFORE SURGERY and the MORNING OF SURGERY with CHG Soap.   If you chose to wash your hair, wash your hair first as usual with your normal shampoo. After you shampoo, rinse your hair and body thoroughly to remove the shampoo.  Then [employer] and genitals (private parts) with your normal soap and rinse thoroughly to remove soap.  After that Use CHG Soap as you would any other liquid soap. You can apply CHG directly to the skin and wash gently with a scrungie or a clean washcloth.   Apply the CHG Soap to your body ONLY FROM THE NECK DOWN.  Do  not use on open wounds or open sores. Avoid contact with your eyes, ears, mouth and genitals (private parts). Wash Face and genitals (private parts)  with your normal soap.   Wash thoroughly, paying special attention to the area where your surgery will be performed.  Thoroughly rinse your body with warm water from the neck down.  DO NOT shower/wash with your normal soap after using and rinsing off the CHG Soap.  Pat yourself dry with a CLEAN TOWEL.  Wear CLEAN PAJAMAS to bed the night before surgery  Place CLEAN SHEETS on your bed the night before your surgery  DO NOT SLEEP WITH PETS.   Day of Surgery:  Take a shower with CHG soap. Wear Clean/Comfortable clothing the morning of surgery Do not apply  any deodorants/lotions.   Remember to brush your teeth WITH YOUR REGULAR TOOTHPASTE.   Please read over the following fact sheets that you were given.

## 2021-07-27 ENCOUNTER — Encounter (HOSPITAL_COMMUNITY): Payer: Self-pay

## 2021-07-27 ENCOUNTER — Encounter (HOSPITAL_COMMUNITY)
Admission: RE | Admit: 2021-07-27 | Discharge: 2021-07-27 | Disposition: A | Discharge status: Home / Self Care | Payer: Medicare HMO | Source: Ambulatory Visit | Attending: Orthopedic Surgery | Admitting: Orthopedic Surgery

## 2021-07-27 ENCOUNTER — Ambulatory Visit: Payer: Self-pay | Admitting: Orthopedic Surgery

## 2021-07-27 DIAGNOSIS — Z8546 Personal history of malignant neoplasm of prostate: Secondary | ICD-10-CM | POA: Insufficient documentation

## 2021-07-27 DIAGNOSIS — Z87442 Personal history of urinary calculi: Secondary | ICD-10-CM | POA: Diagnosis not present

## 2021-07-27 DIAGNOSIS — I10 Essential (primary) hypertension: Secondary | ICD-10-CM | POA: Diagnosis not present

## 2021-07-27 DIAGNOSIS — E669 Obesity, unspecified: Secondary | ICD-10-CM | POA: Insufficient documentation

## 2021-07-27 DIAGNOSIS — E119 Type 2 diabetes mellitus without complications: Secondary | ICD-10-CM | POA: Diagnosis not present

## 2021-07-27 DIAGNOSIS — N132 Hydronephrosis with renal and ureteral calculous obstruction: Secondary | ICD-10-CM | POA: Insufficient documentation

## 2021-07-27 DIAGNOSIS — D351 Benign neoplasm of parathyroid gland: Secondary | ICD-10-CM | POA: Insufficient documentation

## 2021-07-27 DIAGNOSIS — Z6833 Body mass index (BMI) 33.0-33.9, adult: Secondary | ICD-10-CM | POA: Diagnosis not present

## 2021-07-27 DIAGNOSIS — Z01812 Encounter for preprocedural laboratory examination: Secondary | ICD-10-CM | POA: Diagnosis present

## 2021-07-27 DIAGNOSIS — E210 Primary hyperparathyroidism: Secondary | ICD-10-CM | POA: Insufficient documentation

## 2021-07-27 DIAGNOSIS — M5117 Intervertebral disc disorders with radiculopathy, lumbosacral region: Secondary | ICD-10-CM | POA: Diagnosis not present

## 2021-07-27 DIAGNOSIS — Z01818 Encounter for other preprocedural examination: Secondary | ICD-10-CM

## 2021-07-27 LAB — TYPE AND SCREEN
ABO/RH(D): A POS
Antibody Screen: NEGATIVE

## 2021-07-27 LAB — BASIC METABOLIC PANEL
Anion gap: 6 (ref 5–15)
BUN: 28 mg/dL — ABNORMAL HIGH (ref 8–23)
CO2: 21 mmol/L — ABNORMAL LOW (ref 22–32)
Calcium: 9.2 mg/dL (ref 8.9–10.3)
Chloride: 110 mmol/L (ref 98–111)
Creatinine, Ser: 1.3 mg/dL — ABNORMAL HIGH (ref 0.61–1.24)
GFR, Estimated: >60 mL/min (ref >60)
Glucose, Bld: 119 mg/dL — ABNORMAL HIGH (ref 70–99)
Potassium: 3.8 mmol/L (ref 3.5–5.1)
Sodium: 137 mmol/L (ref 135–145)

## 2021-07-27 LAB — URINALYSIS, ROUTINE W REFLEX MICROSCOPIC
Bacteria, UA: NONE SEEN
Bilirubin Urine: NEGATIVE
Glucose, UA: NEGATIVE mg/dL
Hgb urine dipstick: NEGATIVE
Ketones, ur: NEGATIVE mg/dL
Leukocytes,Ua: NEGATIVE
Nitrite: NEGATIVE
Protein, ur: 30 mg/dL — AB
Specific Gravity, Urine: 1.026 (ref 1.005–1.030)
pH: 5 (ref 5.0–8.0)

## 2021-07-27 LAB — CBC
HCT: 46.4 % (ref 39.0–52.0)
Hemoglobin: 15.3 g/dL (ref 13.0–17.0)
MCH: 30.3 pg (ref 26.0–34.0)
MCHC: 33 g/dL (ref 30.0–36.0)
MCV: 91.9 fL (ref 80.0–100.0)
Platelets: 276 10*3/uL (ref 150–400)
RBC: 5.05 MIL/uL (ref 4.22–5.81)
RDW: 13.2 % (ref 11.5–15.5)
WBC: 6 10*3/uL (ref 4.0–10.5)
nRBC: 0 % (ref 0.0–0.2)

## 2021-07-27 LAB — APTT: aPTT: 28 seconds (ref 24–36)

## 2021-07-27 LAB — GLUCOSE, CAPILLARY: Glucose-Capillary: 145 mg/dL — ABNORMAL HIGH (ref 70–99)

## 2021-07-27 LAB — SURGICAL PCR SCREEN
MRSA, PCR: NEGATIVE
Staphylococcus aureus: NEGATIVE

## 2021-07-27 LAB — HEMOGLOBIN A1C
Hgb A1c MFr Bld: 6.8 % — ABNORMAL HIGH (ref 4.8–5.6)
Mean Plasma Glucose: 148.46 mg/dL

## 2021-07-27 LAB — PROTIME-INR
INR: 1 (ref 0.8–1.2)
Prothrombin Time: 13.2 seconds (ref 11.4–15.2)

## 2021-07-27 NOTE — Progress Notes (Signed)
PCP - Anastasia Pall Cardiologist - Dr. Jacqulyn Cane  Chest x-ray - 05/29/21 EKG - 05/19/21 Stress Test - 04/28/21 ECHO - 2015  DM - Type 2 Fasting Blood Sugar - 79   ERAS Protcol - yes, no drink ordered or given   COVID TEST- 05/31/22 (surgery admit)   Anesthesia review: yes, per Dr. Rolena Infante, heart history Patient gave copy of current Stress Test from 04/28/21 (located in paper chart).  Patient denies shortness of breath, fever, cough and chest pain at PAT appointment   All instructions explained to the patient, with a verbal understanding of the material. Patient agrees to go over the instructions while at home for a better understanding. Patient also instructed to self quarantine after being tested for COVID-19. The opportunity to ask questions was provided.

## 2021-07-28 NOTE — Anesthesia Preprocedure Evaluation (Addendum)
Anesthesia Evaluation  Patient identified by MRN, date of birth, ID band Patient awake    Reviewed: Allergy & Precautions, NPO status , Patient's Chart, lab work & pertinent test results  Airway Mallampati: II  TM Distance: >3 FB Neck ROM: Full    Dental no notable dental hx. (+) Dental Advisory Given   Pulmonary neg pulmonary ROS,    Pulmonary exam normal breath sounds clear to auscultation       Cardiovascular hypertension, Pt. on medications and Pt. on home beta blockers Normal cardiovascular exam Rhythm:Regular Rate:Normal  Nuclear stress test 04/28/21 ([facility]): IMPRESSION:  -No evidence of inducible ischemia.  -Small size basal inferior and mid inferior fixed defect suggestive of small scar.  -Small to moderate size mild intensity basal inferolateral, mid inferolateral and basal anterolateral fixed defect secondary to diaphragmatic attenuation and the equivocal for small scar.  -Diaphragmatic attenuation noted at rest and after Lexiscan infusion and could decrease specificity of this study.  -Prognostically this is a low risk scan. Clinical correlation is recommended.  - Left ventricular ejection fraction of 63%. . Borderline dilated LV. LV end-diastolic volume -250 ml, end-systolic volume -44 ml.  - Normal left ventricular systolic wall motion.    Echo 03/23/21 ([facility]): Impression: LeftVentricle: Systolic function is hyperdynamic. EF: 70%.   LeftVentricle: Wall motion is normal.   LeftVentricle: Doppler parameters indicate normal diastolic function.   TricuspidValve: The right ventricular systolic pressure is normal (<36  mmHg).   LeftVentricle: There is mild concentric hypertrophy.  No significant valvular disease was noted.   Neuro/Psych negative neurological ROS     GI/Hepatic negative GI ROS, Neg liver ROS,   Endo/Other  diabetes  Renal/GU negative Renal ROS     Musculoskeletal negative  musculoskeletal ROS (+)   Abdominal (+) + obese,   Peds  Hematology negative hematology ROS (+)   Anesthesia Other Findings   Reproductive/Obstetrics                                                            Anesthesia Evaluation  Patient identified by MRN, date of birth, ID band Patient awake    Reviewed: Allergy & Precautions, NPO status , Patient's Chart, lab work & pertinent test results, reviewed documented beta blocker date and time   History of Anesthesia Complications Negative for: history of anesthetic complications  Airway Mallampati: II  TM Distance: >3 FB Neck ROM: Full    Dental  (+) Dental Advisory Given, Teeth Intact   Pulmonary neg pulmonary ROS,    Pulmonary exam normal        Cardiovascular hypertension, Pt. on medications and Pt. on home beta blockers Normal cardiovascular exam     Neuro/Psych negative neurological ROS  negative psych ROS   GI/Hepatic negative GI ROS, Neg liver ROS,   Endo/Other  diabetes, Type 2, Oral Hypoglycemic Agents Obesity   Renal/GU negative Renal ROS    Prostate cancer     Musculoskeletal negative musculoskeletal ROS (+)   Abdominal   Peds  Hematology negative hematology ROS (+)   Anesthesia Other Findings See PAT note  Reproductive/Obstetrics                           Anesthesia Physical Anesthesia Plan  ASA: 2  Anesthesia Plan: General   Post-op Pain Management:    Induction: Intravenous  PONV Risk Score and Plan: 2 and Treatment may vary due to age or medical condition, Ondansetron, Dexamethasone and Midazolam  Airway Management Planned: Oral ETT  Additional Equipment: None  Intra-op Plan:   Post-operative Plan: Extubation in OR  Informed Consent: I have reviewed the patients History and Physical, chart, labs and discussed the procedure including the risks, benefits and alternatives for the proposed anesthesia with the  patient or authorized representative who has indicated his/her understanding and acceptance.     Dental advisory given  Plan Discussed with: CRNA and Anesthesiologist  Anesthesia Plan Comments:       Anesthesia Quick Evaluation  Anesthesia Physical Anesthesia Plan  ASA: 3  Anesthesia Plan: General   Post-op Pain Management: Minimal or no pain anticipated, Ofirmev IV (intra-op), Dilaudid IV and Ketamine IV   Induction: Intravenous  PONV Risk Score and Plan: 3 and Ondansetron, Dexamethasone, Midazolam, Treatment may vary due to age or medical condition and Propofol infusion  Airway Management Planned: Oral ETT  Additional Equipment:   Intra-op Plan:   Post-operative Plan: Extubation in OR  Informed Consent: I have reviewed the patients History and Physical, chart, labs and discussed the procedure including the risks, benefits and alternatives for the proposed anesthesia with the patient or authorized representative who has indicated his/her understanding and acceptance.     Dental advisory given  Plan Discussed with: CRNA  Anesthesia Plan Comments: (2 x PIV, clearsight  PAT note written 07/28/2021 by Myra Gianotti, PA-C. )      Anesthesia Quick Evaluation

## 2021-07-28 NOTE — Progress Notes (Signed)
Anesthesia Chart Review:  Case: [id number] Date/Time: 08/02/21 0815   Procedure: TRANSFORAMINAL LUMBAR INTERBODY FUSION (TLIF L5-S1) WITH PEDICLE SCREW FIXATION 1 LEVEL - 4.5 hrs 3 C-Bed   Anesthesia type: General   Pre-op diagnosis: Degenerative disc disease L5-S1 with radiculopathy   [facility] / [facility]   Surgeons: Melina Schools, MD       DISCUSSION: Patient is a 66 year old male scheduled for the above procedure.  History includes never smoker, HTN, LVH, DM2, primary hyperparathyroidism (s/p right minimally invasive parathyroidectomy 06/14/21; pathology: parathyroid adenoma), prostate cancer (s/p robot assisted laparoscopic radical prostatectomy 04/09/14), ED (penile prosthesis 2018), right inguinal hernia repair (01/14/17), nephrolithiasis (with left hydronephrosis, urosepsis, s/p left JJ stent 08/18/18, 09/03/18). BMI is consistent with obesity.   He has had recent cardiology evaluations through [facility] Geanie Berlin, Reuel Derby, MD) and [facility] Ellyn Hack, Shanon Brow, MD). He had preoperative cardiology clearance for recent parathyroid surgery on 05/19/21 by Laurann Montana, NP. He was felt acceptable risk for that procedure after recent stress and echo through [facility] (see CV below). He prefers to continue cardiology follow-up through [facility]. He actually saw Dr. Julien Nordmann on 05/22/21 who noted plans for both parathyroid and back surgery. He plans to have patient follow-up in 3 months after the New Year.   He is for preoperative COVID-19 testing on 07/31/2021.  Anesthesia team to evaluate on the day of surgery.   VS: BP (!) 144/77    Pulse 77    Temp 36.6 C (Oral)    Resp 18    Ht _0  (1.778 m)    Wt 105.5 kg    SpO2 100%    BMI 33.36 kg/m    PROVIDERS: Chesley Noon, MD is PCP [facility]) Marina Goodell, MD is cardiologist ([place])   LABS: Labs reviewed: Acceptable for surgery. (all labs ordered are listed, but only abnormal results are  displayed)  Labs Reviewed  GLUCOSE, CAPILLARY - Abnormal; Notable for the following components:      Result Value   Glucose-Capillary 145 (*)    All other components within normal limits  BASIC METABOLIC PANEL - Abnormal; Notable for the following components:   CO2 21 (*)    Glucose, Bld 119 (*)    BUN 28 (*)    Creatinine, Ser 1.30 (*)    All other components within normal limits  URINALYSIS, ROUTINE W REFLEX MICROSCOPIC - Abnormal; Notable for the following components:   Protein, ur 30 (*)    All other components within normal limits  HEMOGLOBIN A1C - Abnormal; Notable for the following components:   Hgb A1c MFr Bld 6.8 (*)    All other components within normal limits  SURGICAL PCR SCREEN  CBC  PROTIME-INR  APTT  TYPE AND SCREEN     IMAGES: CXR 05/29/21: FINDINGS: Linear scarring or plate-like atelectasis in the inferior aspect of the right middle lobe. Elevation of the left hemidiaphragm, chronic and similar to the prior study. No acute consolidative airspace disease. No pleural effusions. No pneumothorax. No evidence of pulmonary edema. Heart size is normal. Upper mediastinal contours are within normal limits. Calcified right hilar and paratracheal lymph nodes are incidentally noted. IMPRESSION: 1. No radiographic evidence of acute cardiopulmonary disease. 2. Chronic elevation of the left hemidiaphragm. 3. Scarring or plate-like atelectasis in the inferior aspect of the right middle lobe.   EKG:  EKG 05/22/21 [facility]): Per Narrative in Care Everywhere: Sinus  Rhythm, HR 68  -Nonspecific ST depression   +  Extensive T-abnormality in I, II, aVL, aVF, V3-V6 -Nondiagnostic  -Possible  Anterior/lateral and inferior  ischemia.   EKG 05/19/21 ([facility]): NSR. Minimal voltage criteria for LVH, may be normal variant. ST & marked T wave abnormality, consider anterolateral ischemia or repolarization abnormality.   CV: Nuclear stress test 04/28/21 ([facility]): IMPRESSION:  -No evidence of inducible ischemia.  -Small size basal inferior and mid inferior fixed defect suggestive of small scar.  -Small to moderate size mild intensity basal inferolateral, mid inferolateral and basal anterolateral fixed defect secondary to diaphragmatic attenuation and the equivocal for small scar.  -Diaphragmatic attenuation noted at rest and after Lexiscan infusion and could decrease specificity of this study.  -Prognostically this is a low risk scan. Clinical correlation is recommended.  - Left ventricular ejection fraction of 63%. . Borderline dilated LV. LV end-diastolic volume -433 ml, end-systolic volume -44 ml.  - Normal left ventricular systolic wall motion.    Echo 03/23/21 ([facility]): Impression: Left Ventricle: Systolic function is hyperdynamic. EF: 70%.    Left Ventricle: Wall motion is normal.    Left Ventricle: Doppler parameters indicate normal diastolic function.    Tricuspid Valve: The right ventricular systolic pressure is normal (<36  mmHg).    Left Ventricle: There is mild concentric hypertrophy.  No significant valvular disease was noted.   Past Medical History:  Diagnosis Date   Erectile dysfunction    Grade I diastolic dysfunction 29/51/8841   Noted on ECHO   History of kidney stones 08/18/2018   7-8 mm left UVJ stone with left hydronephrosis and hydroureter.   History of prostate cancer urologist-  dr Orpah Melter   dx 05/ 2015 -- moderate risk Stage T2c, Gleason 3+4,  vol 152.41cc---  09/ 2015 s/p  radical prostatectomy -- per pt last PSA 07-15-2016  undectable   Hypertension, essential    Difficult control abdominal pain medications.   LVH (left ventricular hypertrophy) 10/14/2013   Mild, Noted on ECHO   Obese    Gained 40 pounds during COVID-19 isolation-lack of exercise   Right inguinal hernia    from penile prosthesis reservior post surgery 11-19-2016   Right inguinal pain 01/03/2017   Sepsis due to Escherichia coli  ([place]) 07/2018   urosepsis and E. Coli bacteremia    Spinal stenosis, lumbosacral region 03/30/2011   Type 2 diabetes mellitus [facility])     Past Surgical History:  Procedure Laterality Date   CYSTOSCOPY/URETEROSCOPY/HOLMIUM LASER/STENT PLACEMENT Left 08/18/2018   Procedure: CYSTOSCOPY/LEFT /STENT PLACEMENT;  Surgeon: Ceasar Mons, MD;  Location: [facility];  Service: Urology;  Laterality: Left;   CYSTOSCOPY/URETEROSCOPY/HOLMIUM LASER/STENT PLACEMENT Left 09/03/2018   Procedure: CYSTOSCOPY/URETEROSCOPY/HOLMIUM LASER/STENT PLACEMENT;  Surgeon: Ceasar Mons, MD;  Location: [facility];  Service: Urology;  Laterality: Left;   INGUINAL HERNIA REPAIR Right 01/14/2017   Procedure: HERNIA REPAIR INGUINAL ADULT;  Surgeon: Cleon Gustin, MD;  Location: [facility];  Service: Urology;  Laterality: Right;   LYMPHADENECTOMY Bilateral 04/09/2014   Procedure: BILATERAL PELVIC LYMPH NODE DISSECTION;  Surgeon: Alexis Frock, MD;  Location: [facility];  Service: Urology;  Laterality: Bilateral;   PARATHYROIDECTOMY Right 06/14/2021   Procedure: RIGHT INFERIOR PARATHYROIDECTOMY;  Surgeon: Armandina Gemma, MD;  Location: [facility];  Service: General;  Laterality: Right;   PENILE PROSTHESIS IMPLANT N/A 11/19/2016   Procedure: AMS PENILE PROTHESIS INFLATABLE;  Surgeon: Cleon Gustin, MD;  Location: [facility];  Service: Urology;  Laterality: N/A;   PENILE PROSTHESIS IMPLANT Right  01/14/2017   Procedure: REVISION PENILE PROSTHESIS;  Surgeon: Cleon Gustin, MD;  Location: [facility];  Service: Urology;  Laterality: Right;   PENILE PROSTHESIS IMPLANT N/A 02/21/2017   Procedure: PENILE PROSTHESIS REVISION;  Surgeon: Cleon Gustin, MD;  Location: [facility];  Service: Urology;  Laterality: N/A;   PROSTATE BIOPSY N/A 12/11/2013   Procedure: BIOPSY TRANSRECTAL ULTRASONIC PROSTATE (TUBP);  Surgeon: Arvil Persons,  MD;  Location: [facility];  Service: Urology;  Laterality: N/A;   right trigger thumb surgery      ROBOT ASSISTED LAPAROSCOPIC RADICAL PROSTATECTOMY N/A 04/09/2014   Procedure: ROBOTIC ASSISTED LAPAROSCOPIC RADICAL PROSTATECTOMY, INDOCYANINE GREEN DYE ;  Surgeon: Alexis Frock, MD;  Location[facility];  Service: Urology;  Laterality: N/A;   TRANSTHORACIC ECHOCARDIOGRAM  10/14/2013   mild LVH/  ef  44-45%/  grade I diastolic dysfunction/  mild LAE    MEDICATIONS:  blood glucose meter kit and supplies KIT   carvedilol (COREG) 3.125 MG tablet   cholecalciferol (VITAMIN D3) 25 MCG (1000 UNIT) tablet   EPINEPHRINE 0.3 mg/0.3 mL IJ SOAJ injection   glipiZIDE (GLUCOTROL XL) 10 MG 24 hr tablet   hydrALAZINE (APRESOLINE) 25 MG tablet   latanoprost (XALATAN) 0.005 % ophthalmic solution   metFORMIN (GLUCOPHAGE) 1000 MG tablet   olmesartan (BENICAR) 40 MG tablet   ONETOUCH ULTRA test strip   rosuvastatin (CRESTOR) 20 MG tablet   traMADol (ULTRAM) 50 MG tablet   No current facility-administered medications for this encounter.    Myra Gianotti, PA-C Surgical Short Stay/Anesthesiology [facility] Phone [phone] [facility] Phone [phone] 07/28/2021 2:03 PM

## 2021-07-31 ENCOUNTER — Other Ambulatory Visit (HOSPITAL_COMMUNITY)
Admission: RE | Admit: 2021-07-31 | Discharge: 2021-07-31 | Disposition: A | Discharge status: Home / Self Care | Payer: Medicare HMO | Source: Ambulatory Visit | Attending: Orthopedic Surgery | Admitting: Orthopedic Surgery

## 2021-07-31 ENCOUNTER — Other Ambulatory Visit: Payer: Self-pay | Admitting: Orthopedic Surgery

## 2021-07-31 DIAGNOSIS — Z01818 Encounter for other preprocedural examination: Secondary | ICD-10-CM

## 2021-07-31 DIAGNOSIS — Z01812 Encounter for preprocedural laboratory examination: Secondary | ICD-10-CM | POA: Insufficient documentation

## 2021-07-31 DIAGNOSIS — Z20822 Contact with and (suspected) exposure to covid-19: Secondary | ICD-10-CM | POA: Insufficient documentation

## 2021-07-31 LAB — SARS CORONAVIRUS 2 (TAT 6-24 HRS): SARS Coronavirus 2: NEGATIVE

## 2021-08-02 ENCOUNTER — Inpatient Hospital Stay (HOSPITAL_COMMUNITY)
Admission: RE | Admit: 2021-08-02 | Discharge: 2021-08-04 | DRG: 455 | Disposition: A | Discharge status: Home / Self Care | Payer: Medicare HMO | Attending: Orthopedic Surgery | Admitting: Orthopedic Surgery

## 2021-08-02 ENCOUNTER — Inpatient Hospital Stay (HOSPITAL_COMMUNITY): Payer: Medicare HMO | Admitting: Vascular Surgery

## 2021-08-02 ENCOUNTER — Other Ambulatory Visit: Payer: Self-pay

## 2021-08-02 ENCOUNTER — Inpatient Hospital Stay (HOSPITAL_COMMUNITY): Payer: Medicare HMO | Admitting: Certified Registered"

## 2021-08-02 ENCOUNTER — Encounter (HOSPITAL_COMMUNITY): Payer: Self-pay | Admitting: Orthopedic Surgery

## 2021-08-02 ENCOUNTER — Inpatient Hospital Stay (HOSPITAL_COMMUNITY): Payer: Medicare HMO

## 2021-08-02 ENCOUNTER — Inpatient Hospital Stay (HOSPITAL_COMMUNITY)
Admission: RE | Disposition: A | Discharge status: Home / Self Care | Payer: Self-pay | Source: Home / Self Care | Attending: Orthopedic Surgery

## 2021-08-02 DIAGNOSIS — M4807 Spinal stenosis, lumbosacral region: Secondary | ICD-10-CM | POA: Diagnosis present

## 2021-08-02 DIAGNOSIS — Z8249 Family history of ischemic heart disease and other diseases of the circulatory system: Secondary | ICD-10-CM

## 2021-08-02 DIAGNOSIS — Z888 Allergy status to other drugs, medicaments and biological substances status: Secondary | ICD-10-CM | POA: Diagnosis not present

## 2021-08-02 DIAGNOSIS — N529 Male erectile dysfunction, unspecified: Secondary | ICD-10-CM | POA: Diagnosis present

## 2021-08-02 DIAGNOSIS — M5116 Intervertebral disc disorders with radiculopathy, lumbar region: Secondary | ICD-10-CM | POA: Diagnosis present

## 2021-08-02 DIAGNOSIS — Z885 Allergy status to narcotic agent status: Secondary | ICD-10-CM

## 2021-08-02 DIAGNOSIS — E785 Hyperlipidemia, unspecified: Secondary | ICD-10-CM | POA: Diagnosis present

## 2021-08-02 DIAGNOSIS — E210 Primary hyperparathyroidism: Secondary | ICD-10-CM | POA: Diagnosis present

## 2021-08-02 DIAGNOSIS — I10 Essential (primary) hypertension: Secondary | ICD-10-CM | POA: Diagnosis present

## 2021-08-02 DIAGNOSIS — Z20822 Contact with and (suspected) exposure to covid-19: Secondary | ICD-10-CM | POA: Diagnosis present

## 2021-08-02 DIAGNOSIS — Z79899 Other long term (current) drug therapy: Secondary | ICD-10-CM

## 2021-08-02 DIAGNOSIS — Z6834 Body mass index (BMI) 34.0-34.9, adult: Secondary | ICD-10-CM | POA: Diagnosis not present

## 2021-08-02 DIAGNOSIS — E669 Obesity, unspecified: Secondary | ICD-10-CM | POA: Diagnosis present

## 2021-08-02 DIAGNOSIS — Z981 Arthrodesis status: Secondary | ICD-10-CM

## 2021-08-02 DIAGNOSIS — Z91013 Allergy to seafood: Secondary | ICD-10-CM | POA: Diagnosis not present

## 2021-08-02 DIAGNOSIS — Z419 Encounter for procedure for purposes other than remedying health state, unspecified: Secondary | ICD-10-CM

## 2021-08-02 DIAGNOSIS — M5117 Intervertebral disc disorders with radiculopathy, lumbosacral region: Principal | ICD-10-CM | POA: Diagnosis present

## 2021-08-02 DIAGNOSIS — Z7984 Long term (current) use of oral hypoglycemic drugs: Secondary | ICD-10-CM

## 2021-08-02 DIAGNOSIS — Z8546 Personal history of malignant neoplasm of prostate: Secondary | ICD-10-CM | POA: Diagnosis not present

## 2021-08-02 DIAGNOSIS — Z841 Family history of disorders of kidney and ureter: Secondary | ICD-10-CM

## 2021-08-02 DIAGNOSIS — E1169 Type 2 diabetes mellitus with other specified complication: Secondary | ICD-10-CM | POA: Diagnosis present

## 2021-08-02 HISTORY — PX: TRANSFORAMINAL LUMBAR INTERBODY FUSION (TLIF) WITH PEDICLE SCREW FIXATION 1 LEVEL: SHX6141

## 2021-08-02 LAB — GLUCOSE, CAPILLARY
Glucose-Capillary: 77 mg/dL (ref 70–99)
Glucose-Capillary: 76 mg/dL (ref 70–99)
Glucose-Capillary: 74 mg/dL (ref 70–99)
Glucose-Capillary: 162 mg/dL — ABNORMAL HIGH (ref 70–99)
Glucose-Capillary: 182 mg/dL — ABNORMAL HIGH (ref 70–99)

## 2021-08-02 SURGERY — TRANSFORAMINAL LUMBAR INTERBODY FUSION (TLIF) WITH PEDICLE SCREW FIXATION 1 LEVEL
Anesthesia: General

## 2021-08-02 MED ORDER — OXYCODONE HCL 5 MG PO TABS: 5.0000 mg | ORAL_TABLET | ORAL | Status: DC | PRN

## 2021-08-02 MED ORDER — PROPOFOL 10 MG/ML IV BOLUS
INTRAVENOUS | Status: DC | PRN
  Administered 2021-08-02: 170 mg via INTRAVENOUS

## 2021-08-02 MED ORDER — HYDRALAZINE HCL 25 MG PO TABS
12.5000 mg | ORAL_TABLET | Freq: Two times a day (BID) | ORAL | Status: DC
  Administered 2021-08-02 – 2021-08-04 (×4): 12.5 mg via ORAL
  Filled 2021-08-02 (×5): qty 0.5

## 2021-08-02 MED ORDER — METFORMIN HCL 500 MG PO TABS
1000.0000 mg | ORAL_TABLET | Freq: Two times a day (BID) | ORAL | Status: DC
  Administered 2021-08-02 – 2021-08-04 (×4): 1000 mg via ORAL
  Filled 2021-08-02 (×4): qty 2

## 2021-08-02 MED ORDER — DOCUSATE SODIUM 100 MG PO CAPS
100.0000 mg | ORAL_CAPSULE | Freq: Two times a day (BID) | ORAL | Status: DC
  Administered 2021-08-02 – 2021-08-04 (×4): 100 mg via ORAL
  Filled 2021-08-02 (×4): qty 1

## 2021-08-02 MED ORDER — HYDROMORPHONE HCL 1 MG/ML IJ SOLN
INTRAMUSCULAR | Status: AC
  Filled 2021-08-02: qty 0.5

## 2021-08-02 MED ORDER — KETAMINE HCL 10 MG/ML IJ SOLN
INTRAMUSCULAR | Status: DC | PRN
Start: 2021-08-02 — End: 2021-08-02
  Administered 2021-08-02: 10 mg via INTRAVENOUS
  Administered 2021-08-02: 20 mg via INTRAVENOUS
  Administered 2021-08-02 (×2): 10 mg via INTRAVENOUS

## 2021-08-02 MED ORDER — CARVEDILOL 3.125 MG PO TABS
3.1250 mg | ORAL_TABLET | Freq: Two times a day (BID) | ORAL | Status: DC
  Administered 2021-08-02 – 2021-08-04 (×4): 3.125 mg via ORAL
  Filled 2021-08-02 (×4): qty 1

## 2021-08-02 MED ORDER — THROMBIN 20000 UNITS EX SOLR: CUTANEOUS | Status: DC | PRN

## 2021-08-02 MED ORDER — INSULIN ASPART 100 UNIT/ML IJ SOLN: 0.0000 [IU] | Freq: Three times a day (TID) | INTRAMUSCULAR | Status: DC

## 2021-08-02 MED ORDER — FENTANYL CITRATE (PF) 250 MCG/5ML IJ SOLN
INTRAMUSCULAR | Status: AC
  Filled 2021-08-02: qty 5

## 2021-08-02 MED ORDER — DEXAMETHASONE SODIUM PHOSPHATE 10 MG/ML IJ SOLN
INTRAMUSCULAR | Status: DC | PRN
Start: 2021-08-02 — End: 2021-08-02
  Administered 2021-08-02: 10 mg via INTRAVENOUS

## 2021-08-02 MED ORDER — HYDROMORPHONE HCL 1 MG/ML IJ SOLN
INTRAMUSCULAR | Status: AC
  Filled 2021-08-02: qty 1

## 2021-08-02 MED ORDER — 0.9 % SODIUM CHLORIDE (POUR BTL) OPTIME
TOPICAL | Status: DC | PRN
  Administered 2021-08-02: 1000 mL

## 2021-08-02 MED ORDER — POLYETHYLENE GLYCOL 3350 17 G PO PACK: 17.0000 g | PACK | Freq: Every day | ORAL | Status: DC | PRN

## 2021-08-02 MED ORDER — HYDROMORPHONE HCL 1 MG/ML IJ SOLN
0.2500 mg | INTRAMUSCULAR | Status: DC | PRN
  Administered 2021-08-02 (×2): 0.5 mg via INTRAVENOUS

## 2021-08-02 MED ORDER — ACETAMINOPHEN 325 MG PO TABS
650.0000 mg | ORAL_TABLET | ORAL | Status: DC | PRN
  Administered 2021-08-02: 650 mg via ORAL
  Filled 2021-08-02 (×3): qty 2

## 2021-08-02 MED ORDER — ACETAMINOPHEN 10 MG/ML IV SOLN
INTRAVENOUS | Status: DC | PRN
  Administered 2021-08-02: 1000 mg via INTRAVENOUS

## 2021-08-02 MED ORDER — PROMETHAZINE HCL 25 MG/ML IJ SOLN
6.2500 mg | INTRAMUSCULAR | Status: DC | PRN
  Administered 2021-08-02: 6.25 mg via INTRAVENOUS

## 2021-08-02 MED ORDER — ONDANSETRON HCL 4 MG/2ML IJ SOLN
INTRAMUSCULAR | Status: DC | PRN
  Administered 2021-08-02: 4 mg via INTRAVENOUS

## 2021-08-02 MED ORDER — KETAMINE HCL 50 MG/5ML IJ SOSY
PREFILLED_SYRINGE | INTRAMUSCULAR | Status: AC
  Filled 2021-08-02: qty 5

## 2021-08-02 MED ORDER — PHENYLEPHRINE HCL-NACL 20-0.9 MG/250ML-% IV SOLN
INTRAVENOUS | Status: DC | PRN
  Administered 2021-08-02: 30 ug/min via INTRAVENOUS

## 2021-08-02 MED ORDER — LACTATED RINGERS IV SOLN: INTRAVENOUS | Status: DC

## 2021-08-02 MED ORDER — PROPOFOL 1000 MG/100ML IV EMUL
INTRAVENOUS | Status: AC
  Filled 2021-08-02: qty 100

## 2021-08-02 MED ORDER — PHENOL 1.4 % MT LIQD: 1.0000 | OROMUCOSAL | Status: DC | PRN

## 2021-08-02 MED ORDER — BUPIVACAINE-EPINEPHRINE (PF) 0.25% -1:200000 IJ SOLN
INTRAMUSCULAR | Status: AC
  Filled 2021-08-02: qty 30

## 2021-08-02 MED ORDER — SODIUM CHLORIDE 0.9 % IV SOLN
0.0125 ug/kg/min | INTRAVENOUS | Status: AC
  Administered 2021-08-02: .05 ug/kg/min via INTRAVENOUS
  Filled 2021-08-02: qty 2000

## 2021-08-02 MED ORDER — MIDAZOLAM HCL 2 MG/2ML IJ SOLN
INTRAMUSCULAR | Status: AC
  Filled 2021-08-02: qty 2

## 2021-08-02 MED ORDER — ONDANSETRON HCL 4 MG/2ML IJ SOLN: 4.0000 mg | Freq: Four times a day (QID) | INTRAMUSCULAR | Status: DC | PRN

## 2021-08-02 MED ORDER — SODIUM CHLORIDE 0.9% FLUSH: 3.0000 mL | INTRAVENOUS | Status: DC | PRN

## 2021-08-02 MED ORDER — OXYCODONE-ACETAMINOPHEN 10-325 MG PO TABS: 1.0000 | ORAL_TABLET | Freq: Four times a day (QID) | ORAL | 0 refills | Status: AC | PRN

## 2021-08-02 MED ORDER — MIDAZOLAM HCL 2 MG/2ML IJ SOLN
INTRAMUSCULAR | Status: DC | PRN
Start: 2021-08-02 — End: 2021-08-02
  Administered 2021-08-02: 2 mg via INTRAVENOUS

## 2021-08-02 MED ORDER — METHOCARBAMOL 500 MG PO TABS: 500.0000 mg | ORAL_TABLET | Freq: Three times a day (TID) | ORAL | 0 refills | Status: AC | PRN

## 2021-08-02 MED ORDER — MENTHOL 3 MG MT LOZG: 1.0000 | LOZENGE | OROMUCOSAL | Status: DC | PRN

## 2021-08-02 MED ORDER — SODIUM CHLORIDE 0.9% FLUSH
3.0000 mL | Freq: Two times a day (BID) | INTRAVENOUS | Status: DC
  Administered 2021-08-02: 3 mL via INTRAVENOUS

## 2021-08-02 MED ORDER — MEPERIDINE HCL 25 MG/ML IJ SOLN: 6.2500 mg | INTRAMUSCULAR | Status: DC | PRN

## 2021-08-02 MED ORDER — FENTANYL CITRATE (PF) 250 MCG/5ML IJ SOLN
INTRAMUSCULAR | Status: DC | PRN
  Administered 2021-08-02 (×4): 25 ug via INTRAVENOUS
  Administered 2021-08-02 (×3): 50 ug via INTRAVENOUS

## 2021-08-02 MED ORDER — ONDANSETRON HCL 4 MG PO TABS: 4.0000 mg | ORAL_TABLET | Freq: Four times a day (QID) | ORAL | Status: DC | PRN

## 2021-08-02 MED ORDER — LIDOCAINE HCL (CARDIAC) PF 100 MG/5ML IV SOSY
PREFILLED_SYRINGE | INTRAVENOUS | Status: DC | PRN
  Administered 2021-08-02: 100 mg via INTRATRACHEAL

## 2021-08-02 MED ORDER — BUPIVACAINE-EPINEPHRINE 0.25% -1:200000 IJ SOLN
INTRAMUSCULAR | Status: DC | PRN
  Administered 2021-08-02: 30 mL

## 2021-08-02 MED ORDER — MUPIROCIN 2 % EX OINT: 1.0000 "application " | TOPICAL_OINTMENT | Freq: Once | CUTANEOUS | Status: DC

## 2021-08-02 MED ORDER — HYDROMORPHONE HCL 1 MG/ML IJ SOLN
INTRAMUSCULAR | Status: DC | PRN
  Administered 2021-08-02: .5 mg via INTRAVENOUS

## 2021-08-02 MED ORDER — HYDROXYZINE HCL 50 MG/ML IM SOLN
50.0000 mg | Freq: Four times a day (QID) | INTRAMUSCULAR | Status: DC | PRN
  Administered 2021-08-03: 50 mg via INTRAMUSCULAR
  Filled 2021-08-02: qty 1

## 2021-08-02 MED ORDER — CHLORHEXIDINE GLUCONATE 0.12 % MT SOLN
15.0000 mL | Freq: Once | OROMUCOSAL | Status: AC
  Administered 2021-08-02: 15 mL via OROMUCOSAL
  Filled 2021-08-02: qty 15

## 2021-08-02 MED ORDER — PHENYLEPHRINE HCL-NACL 20-0.9 MG/250ML-% IV SOLN
INTRAVENOUS | Status: AC
  Filled 2021-08-02: qty 250

## 2021-08-02 MED ORDER — PROPOFOL 500 MG/50ML IV EMUL
INTRAVENOUS | Status: DC | PRN
  Administered 2021-08-02: 80 ug/kg/min via INTRAVENOUS

## 2021-08-02 MED ORDER — HYDROMORPHONE HCL 1 MG/ML IJ SOLN
1.0000 mg | INTRAMUSCULAR | Status: AC | PRN
  Administered 2021-08-02: 1 mg via INTRAVENOUS
  Filled 2021-08-02 (×2): qty 1

## 2021-08-02 MED ORDER — SODIUM CHLORIDE 0.9 % IV SOLN: 250.0000 mL | INTRAVENOUS | Status: DC

## 2021-08-02 MED ORDER — ONDANSETRON HCL 4 MG PO TABS: 4.0000 mg | ORAL_TABLET | Freq: Three times a day (TID) | ORAL | 0 refills | Status: AC | PRN

## 2021-08-02 MED ORDER — CEFAZOLIN SODIUM-DEXTROSE 2-4 GM/100ML-% IV SOLN
2.0000 g | INTRAVENOUS | Status: AC
  Administered 2021-08-02 (×2): 2 g via INTRAVENOUS
  Filled 2021-08-02: qty 100

## 2021-08-02 MED ORDER — CEFAZOLIN SODIUM-DEXTROSE 2-3 GM-%(50ML) IV SOLR: INTRAVENOUS | Status: DC | PRN

## 2021-08-02 MED ORDER — TAMSULOSIN HCL 0.4 MG PO CAPS
0.4000 mg | ORAL_CAPSULE | Freq: Every day | ORAL | Status: DC
  Administered 2021-08-03 – 2021-08-04 (×2): 0.4 mg via ORAL
  Filled 2021-08-02 (×2): qty 1

## 2021-08-02 MED ORDER — METHOCARBAMOL 1000 MG/10ML IJ SOLN
500.0000 mg | Freq: Four times a day (QID) | INTRAVENOUS | Status: DC | PRN
  Filled 2021-08-02: qty 5

## 2021-08-02 MED ORDER — CEFAZOLIN SODIUM-DEXTROSE 1-4 GM/50ML-% IV SOLN
1.0000 g | Freq: Three times a day (TID) | INTRAVENOUS | Status: AC
  Administered 2021-08-02 – 2021-08-03 (×2): 1 g via INTRAVENOUS
  Filled 2021-08-02 (×2): qty 50

## 2021-08-02 MED ORDER — METHOCARBAMOL 500 MG PO TABS
500.0000 mg | ORAL_TABLET | Freq: Four times a day (QID) | ORAL | Status: DC | PRN
  Administered 2021-08-02 – 2021-08-04 (×6): 500 mg via ORAL
  Filled 2021-08-02 (×6): qty 1

## 2021-08-02 MED ORDER — ORAL CARE MOUTH RINSE: 15.0000 mL | Freq: Once | OROMUCOSAL | Status: AC

## 2021-08-02 MED ORDER — PROMETHAZINE HCL 25 MG/ML IJ SOLN
INTRAMUSCULAR | Status: AC
  Filled 2021-08-02: qty 1

## 2021-08-02 MED ORDER — INSULIN ASPART 100 UNIT/ML IJ SOLN: 0.0000 [IU] | Freq: Every day | INTRAMUSCULAR | Status: DC

## 2021-08-02 MED ORDER — SORBITOL 70 % SOLN
30.0000 mL | Freq: Every day | Status: DC | PRN
  Administered 2021-08-02: 30 mL via ORAL
  Filled 2021-08-02: qty 30

## 2021-08-02 MED ORDER — FLEET ENEMA 7-19 GM/118ML RE ENEM: 1.0000 | ENEMA | Freq: Once | RECTAL | Status: DC | PRN

## 2021-08-02 MED ORDER — ACETAMINOPHEN 10 MG/ML IV SOLN
INTRAVENOUS | Status: AC
  Filled 2021-08-02: qty 100

## 2021-08-02 MED ORDER — TRANEXAMIC ACID 1000 MG/10ML IV SOLN
2000.0000 mg | INTRAVENOUS | Status: AC
  Administered 2021-08-02: 2000 mg via TOPICAL
  Filled 2021-08-02: qty 20

## 2021-08-02 MED ORDER — SUCCINYLCHOLINE CHLORIDE 200 MG/10ML IV SOSY
PREFILLED_SYRINGE | INTRAVENOUS | Status: DC | PRN
Start: 2021-08-02 — End: 2021-08-02
  Administered 2021-08-02: 120 mg via INTRAVENOUS

## 2021-08-02 MED ORDER — THROMBIN 20000 UNITS EX KIT
PACK | CUTANEOUS | Status: AC
  Filled 2021-08-02: qty 1

## 2021-08-02 MED ORDER — EPHEDRINE SULFATE 50 MG/ML IJ SOLN
INTRAMUSCULAR | Status: DC | PRN
  Administered 2021-08-02 (×2): 5 mg via INTRAVENOUS

## 2021-08-02 MED ORDER — MUPIROCIN 2 % EX OINT
TOPICAL_OINTMENT | CUTANEOUS | Status: AC
  Filled 2021-08-02: qty 22

## 2021-08-02 MED ORDER — LACTATED RINGERS IV SOLN
INTRAVENOUS | Status: DC | PRN
Start: 2021-08-02 — End: 2021-08-02

## 2021-08-02 MED ORDER — ACETAMINOPHEN 650 MG RE SUPP: 650.0000 mg | RECTAL | Status: DC | PRN

## 2021-08-02 MED ORDER — GLIPIZIDE ER 10 MG PO TB24
10.0000 mg | ORAL_TABLET | Freq: Two times a day (BID) | ORAL | Status: DC
  Administered 2021-08-02 – 2021-08-04 (×4): 10 mg via ORAL
  Filled 2021-08-02 (×5): qty 1

## 2021-08-02 MED ORDER — OXYCODONE HCL 5 MG PO TABS
10.0000 mg | ORAL_TABLET | ORAL | Status: DC | PRN
  Administered 2021-08-02 – 2021-08-03 (×5): 10 mg via ORAL
  Filled 2021-08-02 (×5): qty 2

## 2021-08-02 MED ORDER — ROSUVASTATIN CALCIUM 20 MG PO TABS
20.0000 mg | ORAL_TABLET | Freq: Every day | ORAL | Status: DC
  Administered 2021-08-03 – 2021-08-04 (×2): 20 mg via ORAL
  Filled 2021-08-02 (×2): qty 1

## 2021-08-02 MED ORDER — IRBESARTAN 150 MG PO TABS
300.0000 mg | ORAL_TABLET | Freq: Every day | ORAL | Status: DC
  Administered 2021-08-02 – 2021-08-04 (×3): 300 mg via ORAL
  Filled 2021-08-02 (×3): qty 2

## 2021-08-02 SURGICAL SUPPLY — 74 items
BAG COUNTER SPONGE SURGICOUNT (BAG) ×2 IMPLANT
BAG DECANTER FOR FLEXI CONT (MISCELLANEOUS) ×1 IMPLANT
BLADE CLIPPER SURG (BLADE) IMPLANT
BUR EGG ELITE 4.0 (BURR) ×1 IMPLANT
BUR SURG 4X8 MED (BURR) IMPLANT
BURR SURG 4X8 MED (BURR) ×2
CABLE BIPOLOR RESECTION CORD (MISCELLANEOUS) ×2 IMPLANT
CANISTER SUCT 3000ML PPV (MISCELLANEOUS) ×2 IMPLANT
CLIP NEUROVISION LG (CLIP) ×1 IMPLANT
CLSR STERI-STRIP ANTIMIC 1/2X4 (GAUZE/BANDAGES/DRESSINGS) ×2 IMPLANT
COVER SURGICAL LIGHT HANDLE (MISCELLANEOUS) ×2 IMPLANT
DRAIN CHANNEL 15F RND FF W/TCR (WOUND CARE) IMPLANT
DRAPE C-ARM 42X72 X-RAY (DRAPES) ×2 IMPLANT
DRAPE C-ARMOR (DRAPES) ×2 IMPLANT
DRAPE POUCH INSTRU U-SHP 10X18 (DRAPES) ×2 IMPLANT
DRAPE SURG 17X23 STRL (DRAPES) ×2 IMPLANT
DRAPE U-SHAPE 47X51 STRL (DRAPES) ×2 IMPLANT
DRSG OPSITE POSTOP 4X6 (GAUZE/BANDAGES/DRESSINGS) ×1 IMPLANT
DRSG OPSITE POSTOP 4X8 (GAUZE/BANDAGES/DRESSINGS) ×2 IMPLANT
DURAPREP 26ML APPLICATOR (WOUND CARE) ×2 IMPLANT
ELECT BLADE 4.0 EZ CLEAN MEGAD (MISCELLANEOUS) ×2
ELECT BLADE 6.5 EXT (BLADE) IMPLANT
ELECT PENCIL ROCKER SW 15FT (MISCELLANEOUS) ×2 IMPLANT
ELECT REM PT RETURN 9FT ADLT (ELECTROSURGICAL) ×2
ELECTRODE BLDE 4.0 EZ CLN MEGD (MISCELLANEOUS) ×1 IMPLANT
ELECTRODE REM PT RTRN 9FT ADLT (ELECTROSURGICAL) ×1 IMPLANT
GLOVE SURG MICRO LTX SZ8.5 (GLOVE) ×2 IMPLANT
GLOVE SURG UNDER POLY LF SZ8.5 (GLOVE) ×2 IMPLANT
GOWN STRL REUS W/ TWL LRG LVL3 (GOWN DISPOSABLE) ×1 IMPLANT
GOWN STRL REUS W/TWL 2XL LVL3 (GOWN DISPOSABLE) ×4 IMPLANT
GOWN STRL REUS W/TWL LRG LVL3 (GOWN DISPOSABLE) ×2
GUIDEWIRE NITINOL BEVEL TIP (WIRE) ×4 IMPLANT
IMPL TLX20 8X11X31 20D (Cage) IMPLANT
KIT BASIN OR (CUSTOM PROCEDURE TRAY) ×2 IMPLANT
KIT POSITION SURG JACKSON T1 (MISCELLANEOUS) IMPLANT
KIT TURNOVER KIT B (KITS) ×2 IMPLANT
MODULE EMG NDL SSEP NVM5 (NEEDLE) IMPLANT
MODULE EMG NEEDLE SSEP NVM5 (NEEDLE) ×2 IMPLANT
MODULE NVM5 NEXT GEN EMG (NEEDLE) ×1 IMPLANT
NDL I-PASS III (NEEDLE) IMPLANT
NDL SPNL 18GX3.5 QUINCKE PK (NEEDLE) ×2 IMPLANT
NEEDLE 22X1 1/2 (OR ONLY) (NEEDLE) ×2 IMPLANT
NEEDLE I-PASS III (NEEDLE) ×2 IMPLANT
NEEDLE SPNL 18GX3.5 QUINCKE PK (NEEDLE) ×4 IMPLANT
NS IRRIG 1000ML POUR BTL (IV SOLUTION) ×2 IMPLANT
PACK LAMINECTOMY ORTHO (CUSTOM PROCEDURE TRAY) ×2 IMPLANT
PACK UNIVERSAL I (CUSTOM PROCEDURE TRAY) ×2 IMPLANT
PAD ARMBOARD 7.5X6 YLW CONV (MISCELLANEOUS) ×4 IMPLANT
PATTIES SURGICAL .5 X.5 (GAUZE/BANDAGES/DRESSINGS) IMPLANT
PATTIES SURGICAL .5 X1 (DISPOSABLE) ×2 IMPLANT
POSITIONER HEAD PRONE TRACH (MISCELLANEOUS) ×2 IMPLANT
PROBE BALL TIP NVM5 SNG USE (BALLOONS) ×1 IMPLANT
REDUCTION EXT RELINE MAS MOD (Neuro Prosthesis/Implant) ×4 IMPLANT
ROD LORD RELINE 5.5X30 (Rod) ×1 IMPLANT
ROD RELINE MAS TI 5.5X35MM LRD (Rod) ×1 IMPLANT
SCREW LOCK RELINE 5.5 TULIP (Screw) ×4 IMPLANT
SCREW SHANK MAS MOD 6.5X50MM (Screw) ×2 IMPLANT
SCREW SHANK RELINE 6.5X45MM 2C (Screw) ×2 IMPLANT
SPONGE SURGIFOAM ABS GEL 100 (HEMOSTASIS) ×2 IMPLANT
SPONGE T-LAP 4X18 ~~LOC~~+RFID (SPONGE) ×4 IMPLANT
SURGIFLO W/THROMBIN 8M KIT (HEMOSTASIS) IMPLANT
SUT BONE WAX W31G (SUTURE) ×2 IMPLANT
SUT MNCRL AB 3-0 PS2 27 (SUTURE) ×4 IMPLANT
SUT VIC AB 1 CT1 18XCR BRD 8 (SUTURE) ×1 IMPLANT
SUT VIC AB 1 CT1 8-18 (SUTURE) ×2
SUT VIC AB 2-0 CT1 18 (SUTURE) ×2 IMPLANT
SYR BULB IRRIG 60ML STRL (SYRINGE) ×2 IMPLANT
SYR CONTROL 10ML LL (SYRINGE) ×2 IMPLANT
TLX20 IMPLANT 8X11X31 20D (Cage) ×4 IMPLANT
TOWEL GREEN STERILE (TOWEL DISPOSABLE) ×2 IMPLANT
TOWEL GREEN STERILE FF (TOWEL DISPOSABLE) ×2 IMPLANT
TRAY FOLEY MTR SLVR 16FR STAT (SET/KITS/TRAYS/PACK) ×2 IMPLANT
WATER STERILE IRR 1000ML POUR (IV SOLUTION) ×1 IMPLANT
YANKAUER SUCT BULB TIP NO VENT (SUCTIONS) ×2 IMPLANT

## 2021-08-02 NOTE — Brief Op Note (Signed)
08/02/2021  12:58 PM  PATIENT:  Derrick Walker  66 y.o. male  PRE-OPERATIVE DIAGNOSIS:  Degenerative disc disease L5-S1 with radiculopathy  POST-OPERATIVE DIAGNOSIS:  Degenerative disc disease L5-S1 with radiculopathy  PROCEDURE:  Procedure(s) with comments: TRANSFORAMINAL LUMBAR INTERBODY FUSION (TLIF L5-S1) WITH PEDICLE SCREW FIXATION 1 LEVEL (N/A) - 4.5 hrs 3 C-Bed  SURGEON:  Surgeon(s) and Role:    Melina Schools, MD - Primary  PHYSICIAN ASSISTANT:   ASSISTANTS: Nelson Chimes, PA   ANESTHESIA:   general  EBL:  150 mL   BLOOD ADMINISTERED:none  DRAINS: none   LOCAL MEDICATIONS USED:  MARCAINE     SPECIMEN:  No Specimen  DISPOSITION OF SPECIMEN:  N/A  COUNTS:  YES  TOURNIQUET:  * No tourniquets in log *  DICTATION: .Dragon Dictation  PLAN OF CARE: Admit to inpatient   PATIENT DISPOSITION:  PACU - hemodynamically stable.
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
## 2021-08-02 NOTE — Anesthesia Procedure Notes (Signed)
Procedure Name: Intubation Date/Time: 08/02/2021 8:44 AM Performed by: Reeves Dam, CRNA Pre-anesthesia Checklist: Patient identified, Patient being monitored, Timeout performed, Emergency Drugs available and Suction available Patient Re-evaluated:Patient Re-evaluated prior to induction Oxygen Delivery Method: Circle system utilized Preoxygenation: Pre-oxygenation with 100% oxygen Induction Type: IV induction Ventilation: Mask ventilation without difficulty Laryngoscope Size: Glidescope and 4 Grade View: Grade I Tube type: Oral Tube size: 7.5 mm Number of attempts: 1 Airway Equipment and Method: Stylet Placement Confirmation: ETT inserted through vocal cords under direct vision, positive ETCO2 and breath sounds checked- equal and bilateral Secured at: 21 cm Tube secured with: Tape Dental Injury: Teeth and Oropharynx as per pre-operative assessment

## 2021-08-02 NOTE — Transfer of Care (Signed)
Immediate Anesthesia Transfer of Care Note  Patient: Derrick Walker  Procedure(s) Performed: TRANSFORAMINAL LUMBAR INTERBODY FUSION (TLIF L5-S1) WITH PEDICLE SCREW FIXATION 1 LEVEL  Patient Location: PACU  Anesthesia Type:General  Level of Consciousness: awake, alert , oriented and patient cooperative  Airway & Oxygen Therapy: Patient Spontanous Breathing  Post-op Assessment: Report given to RN, Post -op Vital signs reviewed and stable and Patient moving all extremities X 4  Post vital signs: Reviewed and stable  Last Vitals:  Vitals Value Taken Time  BP 149/67 08/02/21 1329  Temp    Pulse 77 08/02/21 1336  Resp 12 08/02/21 1336  SpO2 94 % 08/02/21 1336  Vitals shown include unvalidated device data.  Last Pain:  Vitals:   08/02/21 0653  TempSrc:   PainSc: 9       Patients Stated Pain Goal: 0 (93/55/21 7471)  Complications: No notable events documented.

## 2021-08-02 NOTE — Discharge Instructions (Signed)
Spine Fusion This sheet gives you information about how to care for yourself after your procedure. Your health care provider may also give you more specific instructions. If you have problems or questions, contact your health care provider. What can I expect after the procedure? After the procedure, it is common to have: Some pain around your incision area. Muscle tightening (spasms) across the back.   Follow these instructions at home: Incision care Follow instructions from your health care provider about how to take care of your incision area. Make sure you: Wash your hands with soap and water before and after you apply medicine to the area or change your bandage (dressing). If soap and water are not available, use hand sanitizer. Change your dressing as told by your health care provider. Leave stitches (sutures), skin glue, or adhesive strips in place. These skin closures may need to stay in place for 2 weeks or longer. If adhesive strip edges start to loosen and curl up, you may trim the loose edges. Do not remove adhesive strips completely unless your health care provider tells you to do that.  Check your incision area every day for signs of infection. Check for: More redness, swelling, or pain. More fluid or blood. Warmth. Pus or a bad smell. Medicines Take over-the-counter and prescription medicines only as told by your health care provider. If you were prescribed an antibiotic medicine, use it as told by your health care provider. Do not stop using the antibiotic even if you start to feel better. If needed, call office in 3 days to request refill of pain medications. Bathing Do not take baths, swim, or use a hot tub for 6 weeks, or until your incision has healed completely. If your health care provider approves, you may take showers after your dressing has been removed. Ok to shower in 5 days Activity Return to your normal activities as told by your health care provider. Ask  your health care provider what activities are safe for you. Avoid bending or twisting at your waist. Always bend at your knees. Do not sit for more than 20-30 minutes at a time. Lie down or walk between periods of sitting. Do not lift anything that is heavier than 10 lb (4.5 kg) or the limit that your health care provider tells you, until he or she says that it is safe. Do not drive for 2 weeks after your procedure or for as long as your health care provider tells you.  Do not drive or use heavy machinery while taking prescription pain medicine. General instructions To prevent or treat constipation while you are taking prescription pain medicine, your health care provider may recommend that you: Drink enough fluid to keep your urine clear or pale yellow. Take over-the-counter or prescription medicines. Eat foods that are high in fiber, such as fresh fruits and vegetables, whole grains, and beans. Limit foods that are high in fat and processed sugars, such as fried and sweet foods. Do breathing exercises as told. Keep all follow-up visits as told by your health care provider. This is important. Contact a health care provider if: You have more redness, swelling, or pain around your incision area. Your incision feels warm to the touch. You are not able to return to activities or do exercises as told by your health care provider. Get help right away if: You have: More fluid or blood coming from your incision area. Pus or a bad smell coming from your incision area. Chills or a fever. Episodes  of dizziness or fainting while standing. You develop a rash. You develop shortness of breath or you have difficulty breathing. You cannot control when you urinate or have a bowel movement. You become weak. You are not able to use your legs. Summary After the procedure, it is common to have some pain around your incision area. You may also have muscle tightening (spasms) across the back. Follow  instructions from your health care provider about how to care for your incision. Do not lift anything that is heavier than 10 lb (4.5 kg) or the limit that your health care provider tells you, until he or she says that it is safe. Contact your health care provider if you have more redness, swelling, or pain around your incision area or if your incision feels warm to the touch. These can be signs of infection. This information is not intended to replace advice given to you by your health care provider. Make sure you discuss any questions you have with your health care provider. Refer to this sheet in the next few weeks. These instructions provide you with information about caring for yourself after your procedure. Your health care provider may also give you more specific instructions. Your treatment has been planned according to current medical practices, but problems sometimes occur. Call your health care provider if you have any problems or questions after your procedure. What can I expect after the procedure? It is common to have pain for the first few days after the procedure. Some people continue to have mild pain even after making a full recovery. Follow these instructions at home: Medicine Take medicines only as directed by your health care provider. Avoid taking over-the-counter pain medicines unless your health care provider tells you otherwise. These medicines interfere with the development and growth of new bone cells. If you were prescribed a narcotic pain medicine, take it exactly as told by your health care provider. Do not drink alcohol while on the medicine. Do not drive while on the medicine. Injury care Care for your back brace as told by your health care provider. If directed, apply ice to the injured area: Put ice in a plastic bag. Place a towel between your skin and the bag. Leave the ice on for 20 minutes, 2-3 times a day. Activity Perform physical therapy exercises as told  by your health care provider. Exercise regularly. Start by taking short walks. Slowly increase your activity level over time. Gentle exercise helps to ease pain. Sit, stand, walk, turn in bed, and reposition yourself as told by your health care provider. This will help to keep your spine in proper alignment. Avoid bending and twisting your body. Avoid doing strenuous household chores, such as vacuuming. Do not lift anything that is heavier than 10 lb (4.5 kg). Other Instructions Keep all follow-up visits as directed by your health care provider. This is important. Do not use any tobacco products, including cigarettes, chewing tobacco, or electronic cigarettes. If you need help quitting, ask your health care provider. Nicotine affects the way bones heal. Contact a health care provider if: Your pain gets worse. You have a fever. You have redness, swelling, or pain at the site of your incision. You have fluid, blood, or pus coming from your incision. You have numbness, tingling, or weakness in any part of your body. Get help right away if: Your incision feels swollen and tender, and the surrounding area looks like a lump. The lump may be red or bluish in color. You cannot  move any part of your body (paralysis). You cannot control your bladder or bowels.

## 2021-08-03 ENCOUNTER — Encounter (HOSPITAL_COMMUNITY): Payer: Self-pay | Admitting: Orthopedic Surgery

## 2021-08-03 LAB — GLUCOSE, CAPILLARY
Glucose-Capillary: 89 mg/dL (ref 70–99)
Glucose-Capillary: 111 mg/dL — ABNORMAL HIGH (ref 70–99)
Glucose-Capillary: 125 mg/dL — ABNORMAL HIGH (ref 70–99)
Glucose-Capillary: 138 mg/dL — ABNORMAL HIGH (ref 70–99)

## 2021-08-03 MED ORDER — OXYCODONE-ACETAMINOPHEN 5-325 MG PO TABS
1.0000 | ORAL_TABLET | ORAL | Status: DC | PRN
  Administered 2021-08-03 – 2021-08-04 (×6): 2 via ORAL
  Filled 2021-08-03 (×6): qty 2

## 2021-08-03 MED ORDER — SENNOSIDES-DOCUSATE SODIUM 8.6-50 MG PO TABS
1.0000 | ORAL_TABLET | Freq: Two times a day (BID) | ORAL | Status: DC
  Administered 2021-08-03 – 2021-08-04 (×3): 1 via ORAL
  Filled 2021-08-03 (×3): qty 1

## 2021-08-03 MED FILL — Thrombin For Soln Kit 20000 Unit: CUTANEOUS | Qty: 1 | Status: AC

## 2021-08-03 NOTE — Evaluation (Signed)
Occupational Therapy Evaluation Patient Details Name: Derrick Walker MRN: [id number] DOB: 09-01-1955 Today's Date: 08/03/2021   History of Present Illness The pt is a 66 yo presenting 1/11 for TLIF L5-S1. PMH includes: HTN, DM II, prostate cancer, and hyperparathyroidism.   Clinical Impression   Pt admitted for concerns listed above. PTA pt reported that he was independent with all ADL's and IADL's, including driving and traveling. At this time, pt requiring min guard for functional mobility, mainly due to pain limitations. He was educated on compensatory strategies for all ADL's and demonstrates good follow through to completing these tasks safely. Pt has no further OT needs and acute OT will sign off.      Recommendations for follow up therapy are one component of a multi-disciplinary discharge planning process, led by the attending physician.  Recommendations may be updated based on patient status, additional functional criteria and insurance authorization.   Follow Up Recommendations  No OT follow up    Assistance Recommended at Discharge PRN  Patient can return home with the following A little help with bathing/dressing/bathroom;Help with stairs or ramp for entrance;Assist for transportation    Functional Status Assessment  Patient has had a recent decline in their functional status and demonstrates the ability to make significant improvements in function in a reasonable and predictable amount of time.  Equipment Recommendations  BSC/3in1;Other (comment) (RW)    Recommendations for Other Services       Precautions / Restrictions Precautions Precautions: Back Precaution Booklet Issued: Yes (comment) Precaution Comments: Reviewed spinal precautions and compensatory strategies Required Braces or Orthoses: Spinal Brace Spinal Brace: Lumbar corset;Applied in sitting position Restrictions Weight Bearing Restrictions: No      Mobility Bed Mobility Overal bed mobility: Needs  Assistance Bed Mobility: Rolling;Sidelying to Sit Rolling: Min guard Sidelying to sit: Min guard       General bed mobility comments: cues for log roll. pt able to complete from flat bed without cues or hand rails    Transfers Overall transfer level: Needs assistance Equipment used: Rolling walker (2 wheels) Transfers: Sit to/from Stand Sit to Stand: Min guard           General transfer comment: minG with increased time and effort to rise, using BUE on RW to complete stand      Balance Overall balance assessment: Mild deficits observed, not formally tested                                         ADL either performed or assessed with clinical judgement   ADL Overall ADL's : Modified independent                                       General ADL Comments: Pt able to demonstrate ability to complete ADL's with compensatory strategies, no assist is needed at this time.     Vision Baseline Vision/History: 0 No visual deficits Ability to See in Adequate Light: 0 Adequate Patient Visual Report: No change from baseline Vision Assessment?: No apparent visual deficits     Perception     Praxis      Pertinent Vitals/Pain Pain Assessment: Faces Faces Pain Scale: Hurts little more Pain Descriptors / Indicators: Discomfort;Cramping;Operative site guarding Pain Intervention(s): Limited activity within patient's tolerance;Monitored during session;Repositioned     Hand  Dominance Right   Extremity/Trunk Assessment Upper Extremity Assessment Upper Extremity Assessment: Overall WFL for tasks assessed   Lower Extremity Assessment Lower Extremity Assessment: Defer to PT evaluation   Cervical / Trunk Assessment Cervical / Trunk Assessment: Back Surgery   Communication Communication Communication: No difficulties   Cognition Arousal/Alertness: Awake/alert Behavior During Therapy: WFL for tasks assessed/performed Overall Cognitive Status:  Within Functional Limits for tasks assessed                                       General Comments  VSS on RA    Exercises     Shoulder Instructions      Home Living Family/patient expects to be discharged to:: Private residence Living Arrangements: Alone Available Help at Discharge: Family;Available PRN/intermittently Type of Home: House Home Access: Stairs to enter [employer] of Steps: 2-3 Entrance Stairs-Rails: Right;Left;Can reach both Home Layout: One level     Bathroom Shower/Tub: Teacher, early years/pre: Standard     Home Equipment: None   Additional Comments: pt planning to stay at extended stay hotel for 2 weeks after surgery in handicapped accessible room with walk in shower, grab bars, and high toilet      Prior Functioning/Environment Prior Level of Function : Independent/Modified Independent;Driving             Mobility Comments: pt reports no limitations in activity, enjoys outdoors activities and scuba diving. driving, independent with all activity without DME use ADLs Comments: independent        OT Problem List: Decreased strength;Decreased activity tolerance;Impaired balance (sitting and/or standing);Decreased safety awareness;Pain      OT Treatment/Interventions:      OT Goals(Current goals can be found in the care plan section) Acute Rehab OT Goals Patient Stated Goal: To go home OT Goal Formulation: With patient Time For Goal Achievement: 08/03/21 Potential to Achieve Goals: Good  OT Frequency:      Co-evaluation              AM-PAC OT "6 Clicks" Daily Activity     Outcome Measure Help from another person eating meals?: None Help from another person taking care of personal grooming?: None Help from another person toileting, which includes using toliet, bedpan, or urinal?: None Help from another person bathing (including washing, rinsing, drying)?: None Help from another person to put on  and taking off regular upper body clothing?: None Help from another person to put on and taking off regular lower body clothing?: None 6 Click Score: 24   End of Session Equipment Utilized During Treatment: Rolling walker (2 wheels);Back brace Nurse Communication: Mobility status  Activity Tolerance: Patient tolerated treatment well Patient left: in chair;with call bell/phone within reach  OT Visit Diagnosis: Unsteadiness on feet (R26.81);Other abnormalities of gait and mobility (R26.89);Muscle weakness (generalized) (M62.81)                Time: 3419-6222 OT Time Calculation (min): 26 min Charges:  OT General Charges $OT Visit: 1 Visit OT Evaluation $OT Eval Moderate Complexity: 1 Mod OT Treatments $Self Care/Home Management : 8-22 mins  Hawk Mones H., OTR/L Acute Rehabilitation  Antrell Tipler Elane Lillianne Eick 08/03/2021, 10:35 AM

## 2021-08-03 NOTE — Progress Notes (Signed)
° ° °  Subjective: Procedure(s) (LRB): TRANSFORAMINAL LUMBAR INTERBODY FUSION (TLIF L5-S1) WITH PEDICLE SCREW FIXATION 1 LEVEL (N/A) 1 Day Post-Op  Patient reports pain as 3 on 0-10 scale.  Reports none leg pain reports incisional back pain   Positive void Negative bowel movement Negative flatus Negative chest pain or shortness of breath  Objective: Vital signs in last 24 hours: Temp:  [97.6 F (36.4 C)-99.9 F (37.7 C)] 98.8 F (37.1 C) (01/12 0726) Pulse Rate:  [59-105] 105 (01/12 0726) Resp:  [10-20] 17 (01/12 0726) BP: (123-162)/(50-98) 157/79 (01/12 0726) SpO2:  [93 %-100 %] 100 % (01/12 0726)  Intake/Output from previous day: 01/11 0701 - 01/12 0700 In: 2580 [P.O.:480; I.V.:2000; IV Piggyback:100] Out: 310 [Urine:160; Blood:150]  Labs: No results for input(s): WBC, RBC, HCT, PLT in the last 72 hours. No results for input(s): NA, K, CL, CO2, BUN, CREATININE, GLUCOSE, CALCIUM in the last 72 hours. No results for input(s): LABPT, INR in the last 72 hours.  Physical Exam: Neurologically intact ABD soft Intact pulses distally Incision: dressing C/D/I and no drainage Compartment soft There is no height or weight on file to calculate BMI.   Assessment/Plan: Patient stable  xrays n/a Continue mobilization with physical therapy Continue care  Advance diet Up with therapy Plan for discharge tomorrow  Melina Schools, MD [facility] [phone]

## 2021-08-03 NOTE — Evaluation (Signed)
Physical Therapy Evaluation Patient Details Name: Derrick Walker MRN: [id number] DOB: 09-07-1955 Today's Date: 08/03/2021  History of Present Illness  The pt is a 66 yo presenting 1/11 for TLIF L5-S1. PMH includes: HTN, DM II, prostate cancer, and hyperparathyroidism.   Clinical Impression  Pt in bed upon arrival of PT, agreeable to evaluation at this time. Prior to admission the pt was completely independent with mobility with no limitations in activity or need for DME. He was living alone in a home with "a few" steps to enter, but has arranged to stay at a handicapped accessible hotel room for a few weeks after surgery. The pt now presents with limitations in functional mobility, power, strength, dynamic stability, and activity tolerance due to above dx, and will continue to benefit from skilled PT to address these deficits. The pt was able to complete bed mobility with log roll without assist but does require cues for technique. The pt did complete ~300 ft hallway ambulation without LOB or need for assist, and was able to demo short bouts of ambulation without BUE support, but demos improved stability with RW. The pt was most limited by LE power and strength to complete sit-stand transfers without BUE support, and therefore will benefit from skilled PT acutely, but is safe to return home with no follow up therapies once cleared by MD for d/c.         Recommendations for follow up therapy are one component of a multi-disciplinary discharge planning process, led by the attending physician.  Recommendations may be updated based on patient status, additional functional criteria and insurance authorization.  Follow Up Recommendations No PT follow up    Assistance Recommended at Discharge Intermittent Supervision/Assistance  Patient can return home with the following  A little help with walking and/or transfers;Two people to help with bathing/dressing/bathroom;Assist for transportation;Assistance  with cooking/housework    Equipment Recommendations Rolling walker (2 wheels);BSC/3in1  Recommendations for Other Services       Functional Status Assessment Patient has had a recent decline in their functional status and demonstrates the ability to make significant improvements in function in a reasonable and predictable amount of time.     Precautions / Restrictions Precautions Precautions: Back Precaution Booklet Issued: Yes (comment) Required Braces or Orthoses: Spinal Brace Spinal Brace: Lumbar corset;Applied in sitting position Restrictions Weight Bearing Restrictions: No      Mobility  Bed Mobility Overal bed mobility: Needs Assistance Bed Mobility: Rolling;Sidelying to Sit Rolling: Min guard Sidelying to sit: Min guard       General bed mobility comments: cues for log roll. pt able to complete from flat bed without cues or hand rails    Transfers Overall transfer level: Needs assistance Equipment used: Rolling walker (2 wheels) Transfers: Sit to/from Stand Sit to Stand: Min guard           General transfer comment: minG with increased time and effort to rise, using BUE on RW to complete stand    Ambulation/Gait Ambulation/Gait assistance: Supervision Gait Distance (Feet): 300 Feet Assistive device: Rolling walker (2 wheels);None Gait Pattern/deviations: Step-through pattern;Decreased stride length Gait velocity: 0.39 m/s Gait velocity interpretation: <1.31 ft/sec, indicative of household ambulator   General Gait Details: pt with slightly slowed gait but no LOB. cues for posture and relaxing shoulders with gait. able to walk without UE support for short distances      Balance Overall balance assessment: Mild deficits observed, not formally tested  Pertinent Vitals/Pain Pain Assessment: Faces Faces Pain Scale: Hurts little more Pain Descriptors / Indicators: Discomfort;Cramping;Operative  site guarding Pain Intervention(s): Limited activity within patient's tolerance;Monitored during session;Repositioned    Home Living Family/patient expects to be discharged to:: Private residence Living Arrangements: Alone Available Help at Discharge: Family;Available PRN/intermittently Type of Home: House Home Access: Stairs to enter Entrance Stairs-Rails: Right;Left;Can reach both Entrance Stairs-Number of Steps: 2-3   Home Layout: One level Home Equipment: None Additional Comments: pt planning to stay at extended stay hotel for 2 weeks after surgery in handicapped accessible room with walk in shower, grab bars, and high toilet    Prior Function Prior Level of Function : Independent/Modified Independent;Driving             Mobility Comments: pt reports no limitations in activity, enjoys outdoors activities and scuba diving. driving, independent with all activity without DME use ADLs Comments: independent     Hand Dominance   Dominant Hand: Right    Extremity/Trunk Assessment   Upper Extremity Assessment Upper Extremity Assessment: Defer to OT evaluation;Overall [facility] for tasks assessed    Lower Extremity Assessment Lower Extremity Assessment: Overall WFL for tasks assessed (pt denies difference in sensation, grossly 4+/5 to MMT.)    Cervical / Trunk Assessment Cervical / Trunk Assessment: Back Surgery  Communication   Communication: No difficulties  Cognition Arousal/Alertness: Awake/alert Behavior During Therapy: WFL for tasks assessed/performed Overall Cognitive Status: Within Functional Limits for tasks assessed                                          General Comments General comments (skin integrity, edema, etc.): VSS on RA        Assessment/Plan    PT Assessment Patient needs continued PT services  PT Problem List Decreased strength;Decreased range of motion;Decreased mobility;Decreased balance;Decreased activity  tolerance;Pain;Decreased knowledge of precautions       PT Treatment Interventions DME instruction;Gait training;Stair training;Functional mobility training;Therapeutic activities;Therapeutic exercise;Balance training;Patient/family education    PT Goals (Current goals can be found in the Care Plan section)  Acute Rehab PT Goals Patient Stated Goal: return to activity such as scuba diving PT Goal Formulation: With patient Time For Goal Achievement: 08/17/21 Potential to Achieve Goals: Good    Frequency Min 5X/week        AM-PAC PT "6 Clicks" Mobility  Outcome Measure Help needed turning from your back to your side while in a flat bed without using bedrails?: A Little Help needed moving from lying on your back to sitting on the side of a flat bed without using bedrails?: A Little Help needed moving to and from a bed to a chair (including a wheelchair)?: A Little Help needed standing up from a chair using your arms (e.g., wheelchair or bedside chair)?: A Little Help needed to walk in hospital room?: A Little Help needed climbing 3-5 steps with a railing? : A Little 6 Click Score: 18    End of Session Equipment Utilized During Treatment: Gait belt;Back brace Activity Tolerance: Patient tolerated treatment well Patient left: in chair;with call bell/phone within reach Nurse Communication: Mobility status PT Visit Diagnosis: Other abnormalities of gait and mobility (R26.89);Unsteadiness on feet (R26.81);Muscle weakness (generalized) (M62.81)    Time: 8850-2774 PT Time Calculation (min) (ACUTE ONLY): 28 min   Charges:   PT Evaluation $PT Eval Low Complexity: 1 Low PT Treatments $Therapeutic Exercise: 8-22 mins  [place], PT, DPT   Acute Rehabilitation Department Pager #: [phone]  Sandra Cockayne 08/03/2021, 8:40 AM

## 2021-08-03 NOTE — Anesthesia Postprocedure Evaluation (Signed)
Anesthesia Post Note  Patient: Derrick Walker  Procedure(s) Performed: TRANSFORAMINAL LUMBAR INTERBODY FUSION (TLIF L5-S1) WITH PEDICLE SCREW FIXATION 1 LEVEL     Patient location during evaluation: PACU Anesthesia Type: General Level of consciousness: sedated and patient cooperative Pain management: pain level controlled Vital Signs Assessment: post-procedure vital signs reviewed and stable Respiratory status: spontaneous breathing Cardiovascular status: stable Anesthetic complications: no   No notable events documented.  Last Vitals:  Vitals:   08/03/21 0348 08/03/21 0726  BP: (!) 154/76 (!) 157/79  Pulse: 92 (!) 105  Resp: 20 17  Temp: 37.7 C 37.1 C  SpO2: 97% 100%    Last Pain:  Vitals:   08/03/21 0726  TempSrc: Oral  PainSc:                  Nolon Nations

## 2021-08-04 ENCOUNTER — Encounter (HOSPITAL_COMMUNITY): Payer: Self-pay | Admitting: Orthopedic Surgery

## 2021-08-04 LAB — GLUCOSE, CAPILLARY: Glucose-Capillary: 113 mg/dL — ABNORMAL HIGH (ref 70–99)

## 2021-08-04 NOTE — Discharge Summary (Signed)
Patient ID: Derrick Walker MRN: [id number] DOB/AGE: 1956-07-18 66 y.o.  Admit date: 08/02/2021 Discharge date: 08/04/2021  Admission Diagnoses:  Principal Problem:   S/P lumbar fusion   Discharge Diagnoses:  Principal Problem:   S/P lumbar fusion  status post Procedure(s): TRANSFORAMINAL LUMBAR INTERBODY FUSION (TLIF L5-S1) WITH PEDICLE SCREW FIXATION 1 LEVEL  Past Medical History:  Diagnosis Date   Erectile dysfunction    Grade I diastolic dysfunction 41/42/3953   Noted on ECHO   History of kidney stones 08/18/2018   7-8 mm left UVJ stone with left hydronephrosis and hydroureter.   History of prostate cancer urologist-  dr Orpah Melter   dx 05/ 2015 -- moderate risk Stage T2c, Gleason 3+4,  vol 152.41cc---  09/ 2015 s/p  radical prostatectomy -- per pt last PSA 07-15-2016  undectable   Hypertension, essential    Difficult control abdominal pain medications.   LVH (left ventricular hypertrophy) 10/14/2013   Mild, Noted on ECHO   Obese    Gained 40 pounds during COVID-19 isolation-lack of exercise   Right inguinal hernia    from penile prosthesis reservior post surgery 11-19-2016   Right inguinal pain 01/03/2017   Sepsis due to Escherichia coli ([place]) 07/2018   urosepsis and E. Coli bacteremia    Spinal stenosis, lumbosacral region 03/30/2011   Type 2 diabetes mellitus (HCC)     Surgeries: Procedure(s): TRANSFORAMINAL LUMBAR INTERBODY FUSION (TLIF L5-S1) WITH PEDICLE SCREW FIXATION 1 LEVEL on 08/02/2021   Consultants:   Discharged Condition: Improved  Hospital Course: Derrick Walker is an 66 y.o. male who was admitted 08/02/2021 for operative treatment of S/P lumbar fusion. Patient failed conservative treatments (please see the history and physical for the specifics) and had severe unremitting pain that affects sleep, daily activities and work/hobbies. After pre-op clearance, the patient was taken to the operating room on 08/02/2021 and underwent   Procedure(s): TRANSFORAMINAL LUMBAR INTERBODY FUSION (TLIF L5-S1) WITH PEDICLE SCREW FIXATION 1 LEVEL.    Patient was given perioperative antibiotics:  Anti-infectives (From admission, onward)    Start     Dose/Rate Route Frequency Ordered Stop   08/02/21 2000  ceFAZolin (ANCEF) IVPB 1 g/50 mL premix        1 g 100 mL/hr over 30 Minutes Intravenous Every 8 hours 08/02/21 1443 08/03/21 0430   08/02/21 0633  ceFAZolin (ANCEF) IVPB 2g/100 mL premix        2 g 200 mL/hr over 30 Minutes Intravenous 30 min pre-op 08/02/21 2023 08/02/21 1227        Patient was given sequential compression devices and early ambulation to prevent DVT.   Patient benefited maximally from hospital stay and there were no complications. At the time of discharge, the patient was urinating/moving their bowels without difficulty, tolerating a regular diet, pain is controlled with oral pain medications and they have been cleared by PT/OT.   Recent vital signs: Patient Vitals for the past 24 hrs:  BP Temp Temp src Pulse Resp SpO2  08/04/21 0803 (!) 150/69 99.1 F (37.3 C) Oral 99 17 96 %  08/04/21 0536 (!) 145/68 99.3 F (37.4 C) Oral 99 20 97 %  08/04/21 0049 128/74 99.7 F (37.6 C) Oral 95 20 97 %  08/03/21 2037 (!) 151/78 99.8 F (37.7 C) Oral (!) 102 18 96 %  08/03/21 1651 122/71 99.2 F (37.3 C) Oral (!) 107 17 98 %     Recent laboratory studies: No results for input(s): WBC, HGB, HCT, PLT,  NA, K, CL, CO2, BUN, CREATININE, GLUCOSE, INR, CALCIUM in the last 72 hours.  Invalid input(s): PT, 2   Discharge Medications:   Allergies as of 08/04/2021       Reactions   Amlodipine Swelling   Hands swell up Severe swelling of hands   Shellfish Allergy Anaphylaxis   Oxycodone Hives   Able to take percocet without problems   Lisinopril Other (See Comments)   Pt states gives him spasm in chest   Losartan Swelling   Kidney pain, leg swelling        Medication List     STOP taking these medications     traMADol 50 MG tablet Commonly known as: ULTRAM       TAKE these medications    blood glucose meter kit and supplies Kit Inject 1 each into the skin 4 (four) times daily. Dispense based on patient and insurance preference. Dx: E11.9   carvedilol 3.125 MG tablet Commonly known as: COREG Take 3.125 mg by mouth 2 (two) times daily.   cholecalciferol 25 MCG (1000 UNIT) tablet Commonly known as: VITAMIN D3 Take 3,000 Units by mouth daily.   EPINEPHrine 0.3 mg/0.3 mL Soaj injection Commonly known as: EPI-PEN INJECT 0.3 MLS INTO THE MUSCLE ONCE FOR 1 DOSE   glipiZIDE 10 MG 24 hr tablet Commonly known as: GLUCOTROL XL Take 10 mg by mouth 2 (two) times daily.   hydrALAZINE 25 MG tablet Commonly known as: APRESOLINE Take 12.5 mg by mouth 2 (two) times daily.   latanoprost 0.005 % ophthalmic solution Commonly known as: XALATAN Place 1 drop into both eyes at bedtime.   metFORMIN 1000 MG tablet Commonly known as: GLUCOPHAGE Take 1,000 mg by mouth 2 (two) times daily with a meal.   methocarbamol 500 MG tablet Commonly known as: Robaxin Take 1 tablet (500 mg total) by mouth every 8 (eight) hours as needed for up to 5 days for muscle spasms.   olmesartan 40 MG tablet Commonly known as: BENICAR Take 40 mg by mouth daily.   ondansetron 4 MG tablet Commonly known as: Zofran Take 1 tablet (4 mg total) by mouth every 8 (eight) hours as needed for nausea or vomiting.   OneTouch Ultra test strip Generic drug: glucose blood USE 1 STRIP TO CHECK GLUCOSE 4 TIMES DAILY   oxyCODONE-acetaminophen 10-325 MG tablet Commonly known as: Percocet Take 1 tablet by mouth every 6 (six) hours as needed for up to 5 days for pain.   rosuvastatin 20 MG tablet Commonly known as: CRESTOR Take 20 mg by mouth daily.        Diagnostic Studies: DG Lumbar Spine 2-3 Views  Result Date: 08/02/2021 CLINICAL DATA:  Transforaminal lumbar interbody fusion (TLIF L5-S1) EXAM: LUMBAR SPINE - 2-3 VIEW  COMPARISON:  None. FINDINGS: Images were performed intraoperatively without the presence of a radiologist. The patient appears to be undergoing L5-S1 bilateral transpedicular rod and screw fusion with associated intervertebral disc spacer. Please see intraoperative findings for further detail. FLUOROSCOPY TIME:  5 minutes 19 seconds. IMPRESSION: Surgical fluoroscopy as described above. Electronically Signed   By: Yvonne Kendall   On: 08/02/2021 12:49   DG C-Arm 1-60 Min-No Report  Result Date: 08/02/2021 Fluoroscopy was utilized by the requesting physician.  No radiographic interpretation.   DG C-Arm 1-60 Min-No Report  Result Date: 08/02/2021 Fluoroscopy was utilized by the requesting physician.  No radiographic interpretation.   DG C-Arm 1-60 Min-No Report  Result Date: 08/02/2021 Fluoroscopy was utilized by the requesting physician.  No radiographic interpretation.   DG C-Arm 1-60 Min-No Report  Result Date: 08/02/2021 Fluoroscopy was utilized by the requesting physician.  No radiographic interpretation.    Discharge Instructions     Incentive spirometry RT   Complete by: As directed         Follow-up Information     Melina Schools, MD. Schedule an appointment as soon as possible for a visit in 2 week(s).   Specialty: Orthopedic Surgery Why: If symptoms worsen, For suture removal, For wound re-check Contact information: [address] [place] [address] [phone]                 Discharge Plan:  discharge to home  Disposition: stable    Signed: Charlyne Petrin for [facility] PA-C [facility] [phone] 08/04/2021, 1:30 PM

## 2021-08-04 NOTE — Progress Notes (Signed)
Subjective: 2 Days Post-Op Procedure(s) (LRB): TRANSFORAMINAL LUMBAR INTERBODY FUSION (TLIF L5-S1) WITH PEDICLE SCREW FIXATION 1 LEVEL (N/A) Patient reports pain as mild.   Leg pain improved. +void +flatus Tolerating PO without N/V +ambulation No CP, SOB, calf pain  Objective: Vital signs in last 24 hours: Temp:  [99.2 F (37.3 C)-99.8 F (37.7 C)] 99.3 F (37.4 C) (01/13 0536) Pulse Rate:  [95-107] 99 (01/13 0536) Resp:  [16-20] 20 (01/13 0536) BP: (122-161)/(68-82) 145/68 (01/13 0536) SpO2:  [96 %-100 %] 97 % (01/13 0536)  Intake/Output from previous day: No intake/output data recorded. Intake/Output this shift: No intake/output data recorded.  No results for input(s): HGB in the last 72 hours. No results for input(s): WBC, RBC, HCT, PLT in the last 72 hours. No results for input(s): NA, K, CL, CO2, BUN, CREATININE, GLUCOSE, CALCIUM in the last 72 hours. No results for input(s): LABPT, INR in the last 72 hours.  Neurologically intact ABD soft Neurovascular intact Sensation intact distally Intact pulses distally Dorsiflexion/Plantar flexion intact Incision: dressing C/D/I No cellulitis present Compartment soft   Assessment/Plan: 2 Days Post-Op Procedure(s) (LRB): TRANSFORAMINAL LUMBAR INTERBODY FUSION (TLIF L5-S1) WITH PEDICLE SCREW FIXATION 1 LEVEL (N/A) Advance diet Up with therapy LSO when OOB IS encouraged DVT Ppx: Teds, SCDs, ambulation  Plan D/C to home today. F/u in 2 weeks. Scrips and AVS printed in chart.      Charlyne Petrin 08/04/2021, 7:32 AM

## 2021-08-04 NOTE — Progress Notes (Signed)
Physical Therapy Treatment Patient Details Name: Derrick Walker MRN: [id number] DOB: January 20, 1956 Today's Date: 08/04/2021   History of Present Illness The pt is a 66 yo presenting 1/11 for TLIF L5-S1. PMH includes: HTN, DM II, prostate cancer, and hyperparathyroidism.    PT Comments    Pt progressing well with post-op mobility. He was able to demonstrate transfers and ambulation with gross min guard assist to supervision for safety with RW for support. Pt was educated on precautions, brace application/wearing schedule, appropriate activity progression, and car transfer. Will continue to follow.      Recommendations for follow up therapy are one component of a multi-disciplinary discharge planning process, led by the attending physician.  Recommendations may be updated based on patient status, additional functional criteria and insurance authorization.  Follow Up Recommendations  No PT follow up     Assistance Recommended at Discharge Intermittent Supervision/Assistance  Patient can return home with the following A little help with walking and/or transfers;Two people to help with bathing/dressing/bathroom;Assist for transportation;Assistance with Warden/ranger (2 wheels);BSC/3in1    Recommendations for Other Services       Precautions / Restrictions Precautions Precautions: Back Precaution Booklet Issued: Yes (comment) Precaution Comments: Reviewed spinal precautions and compensatory strategies Required Braces or Orthoses: Spinal Brace Spinal Brace: Lumbar corset;Applied in sitting position Restrictions Weight Bearing Restrictions: No     Mobility  Bed Mobility               General bed mobility comments: Pt was received sitting up in the recliner. Verbally reviewed log roll technique.    Transfers Overall transfer level: Modified independent Equipment used: Rolling walker (2 wheels) Transfers: Sit to/from Stand              General transfer comment: No assist to power up to full stand. VC's throughout for optimal posture and hand placement on seated surface for safety.    Ambulation/Gait Ambulation/Gait assistance: Supervision Gait Distance (Feet): 400 Feet Assistive device: Rolling walker (2 wheels);None Gait Pattern/deviations: Step-through pattern;Decreased stride length Gait velocity: 0.39 m/s Gait velocity interpretation: <1.31 ft/sec, indicative of household ambulator   General Gait Details: Slow but generally steady with RW for support. No overt LOB noted. VC's throughout for relaxing shoulders and closer walker proximity.   Stairs Stairs: Yes Stairs assistance: Min guard Stair Management: Two rails;Step to pattern;Forwards Number of Stairs: 3 General stair comments: VC's for sequencing and general safety. No assist required however hands on guarding provided for safety 2 increased pain.   Wheelchair Mobility    Modified Rankin (Stroke Patients Only)       Balance Overall balance assessment: Mild deficits observed, not formally tested                                          Cognition Arousal/Alertness: Awake/alert Behavior During Therapy: WFL for tasks assessed/performed Overall Cognitive Status: Within Functional Limits for tasks assessed                                          Exercises      General Comments        Pertinent Vitals/Pain Pain Assessment: Faces Faces Pain Scale: Hurts little more Pain Descriptors / Indicators: Discomfort;Cramping;Operative site guarding Pain Intervention(s): Limited  activity within patient's tolerance;Monitored during session;Repositioned    Home Living                          Prior Function            PT Goals (current goals can now be found in the care plan section) Acute Rehab PT Goals Patient Stated Goal: return to activity such as scuba diving PT Goal Formulation:  With patient Time For Goal Achievement: 08/17/21 Potential to Achieve Goals: Good Progress towards PT goals: Progressing toward goals    Frequency    Min 5X/week      PT Plan Current plan remains appropriate    Co-evaluation              AM-PAC PT "6 Clicks" Mobility   Outcome Measure  Help needed turning from your back to your side while in a flat bed without using bedrails?: A Little Help needed moving from lying on your back to sitting on the side of a flat bed without using bedrails?: A Little Help needed moving to and from a bed to a chair (including a wheelchair)?: A Little Help needed standing up from a chair using your arms (e.g., wheelchair or bedside chair)?: A Little Help needed to walk in hospital room?: A Little Help needed climbing 3-5 steps with a railing? : A Little 6 Click Score: 18    End of Session Equipment Utilized During Treatment: Gait belt;Back brace Activity Tolerance: Patient tolerated treatment well Patient left: in chair;with call bell/phone within reach Nurse Communication: Mobility status PT Visit Diagnosis: Other abnormalities of gait and mobility (R26.89);Unsteadiness on feet (R26.81);Muscle weakness (generalized) (M62.81)     Time: 4782-9562 PT Time Calculation (min) (ACUTE ONLY): 27 min  Charges:  $Gait Training: 23-37 mins                     Rolinda Roan, PT, DPT Acute Rehabilitation Services Pager: [phone] Office: [phone]    Thelma Comp 08/04/2021, 12:15 PM

## 2021-09-12 ENCOUNTER — Encounter: Payer: Self-pay | Admitting: Vascular Surgery

## 2023-02-05 ENCOUNTER — Emergency Department (HOSPITAL_COMMUNITY): Payer: Medicare HMO

## 2023-02-05 ENCOUNTER — Emergency Department (HOSPITAL_COMMUNITY)
Admission: EM | Admit: 2023-02-05 | Discharge: 2023-02-05 | Disposition: A | Discharge status: Home / Self Care | Payer: Medicare HMO | Attending: Emergency Medicine | Admitting: Emergency Medicine

## 2023-02-05 ENCOUNTER — Encounter (HOSPITAL_COMMUNITY): Payer: Self-pay

## 2023-02-05 ENCOUNTER — Other Ambulatory Visit: Payer: Self-pay

## 2023-02-05 DIAGNOSIS — R053 Chronic cough: Secondary | ICD-10-CM | POA: Insufficient documentation

## 2023-02-05 DIAGNOSIS — Z79899 Other long term (current) drug therapy: Secondary | ICD-10-CM | POA: Insufficient documentation

## 2023-02-05 DIAGNOSIS — Z7984 Long term (current) use of oral hypoglycemic drugs: Secondary | ICD-10-CM | POA: Insufficient documentation

## 2023-02-05 DIAGNOSIS — R0781 Pleurodynia: Secondary | ICD-10-CM | POA: Diagnosis not present

## 2023-02-05 DIAGNOSIS — I10 Essential (primary) hypertension: Secondary | ICD-10-CM | POA: Diagnosis not present

## 2023-02-05 DIAGNOSIS — Z8546 Personal history of malignant neoplasm of prostate: Secondary | ICD-10-CM | POA: Insufficient documentation

## 2023-02-05 DIAGNOSIS — E119 Type 2 diabetes mellitus without complications: Secondary | ICD-10-CM | POA: Insufficient documentation

## 2023-02-05 LAB — CBC
HCT: 46.5 % (ref 39.0–52.0)
Hemoglobin: 15.3 g/dL (ref 13.0–17.0)
MCH: 31.1 pg (ref 26.0–34.0)
MCHC: 32.9 g/dL (ref 30.0–36.0)
MCV: 94.5 fL (ref 80.0–100.0)
Platelets: 226 10*3/uL (ref 150–400)
RBC: 4.92 MIL/uL (ref 4.22–5.81)
RDW: 13.1 % (ref 11.5–15.5)
WBC: 7.7 10*3/uL (ref 4.0–10.5)
nRBC: 0 % (ref 0.0–0.2)

## 2023-02-05 LAB — BASIC METABOLIC PANEL
Anion gap: 8 (ref 5–15)
BUN: 14 mg/dL (ref 8–23)
CO2: 25 mmol/L (ref 22–32)
Calcium: 8.7 mg/dL — ABNORMAL LOW (ref 8.9–10.3)
Chloride: 106 mmol/L (ref 98–111)
Creatinine, Ser: 0.99 mg/dL (ref 0.61–1.24)
GFR, Estimated: >60 mL/min (ref >60)
Glucose, Bld: 268 mg/dL — ABNORMAL HIGH (ref 70–99)
Potassium: 3.9 mmol/L (ref 3.5–5.1)
Sodium: 139 mmol/L (ref 135–145)

## 2023-02-05 LAB — TROPONIN I (HIGH SENSITIVITY): Troponin I (High Sensitivity): 10 ng/L (ref <18)

## 2023-02-05 MED ORDER — LIDOCAINE 5 % EX PTCH
1.0000 | MEDICATED_PATCH | CUTANEOUS | Status: DC
  Administered 2023-02-05: 1 via TRANSDERMAL
  Filled 2023-02-05: qty 1

## 2023-02-05 MED ORDER — METHOCARBAMOL 500 MG PO TABS
500.0000 mg | ORAL_TABLET | Freq: Once | ORAL | Status: AC
  Administered 2023-02-05: 500 mg via ORAL
  Filled 2023-02-05: qty 1

## 2023-02-05 MED ORDER — DOXYCYCLINE HYCLATE 100 MG PO CAPS: 100.0000 mg | ORAL_CAPSULE | Freq: Two times a day (BID) | ORAL | 0 refills | Status: AC

## 2023-02-05 MED ORDER — IOHEXOL 350 MG/ML SOLN
75.0000 mL | Freq: Once | INTRAVENOUS | Status: AC | PRN
  Administered 2023-02-05: 75 mL via INTRAVENOUS

## 2023-02-05 MED ORDER — PANTOPRAZOLE SODIUM 20 MG PO TBEC: 20.0000 mg | DELAYED_RELEASE_TABLET | Freq: Every day | ORAL | 0 refills | Status: AC

## 2023-02-05 NOTE — ED Provider Notes (Signed)
  Physical Exam  BP (!) 180/83   Pulse 81   Temp 98.7 F (37.1 C) (Oral)   Resp (!) 21   Ht 5\' 11"  (1.803 m)   Wt 108 kg   SpO2 99%   BMI 33.19 kg/m   Physical Exam Vitals and nursing note reviewed.  Constitutional:      Appearance: Normal appearance.  HENT:     Head: Normocephalic and atraumatic.     Mouth/Throat:     Mouth: Mucous membranes are moist.  Cardiovascular:     Rate and Rhythm: Normal rate.  Pulmonary:     Effort: Pulmonary effort is normal.  Abdominal:     General: Abdomen is flat.  Musculoskeletal:     Cervical back: Normal range of motion and neck supple.  Skin:    General: Skin is warm and dry.  Neurological:     Mental Status: He is alert and oriented to person, place, and time.     Procedures  Procedures  ED Course / MDM    Medical Decision Making Amount and/or Complexity of Data Reviewed Radiology: ordered.  Risk Prescription drug management.   Patient care assumed from Robbie Louis PA at shift change, please see his note for full HPI.  Briefly, patient here with left sided lower thoracic pain and cough.  Patient has been persistent also was hypertensive on arrival.  Did have a chest x-ray that showed a small pleural effusion and persistent pain.  Plan was for CT angio rule out pulmonary embolism in the setting of hypercoagulable disease.  CT Angio showed: 1. No evidence of significant pulmonary embolus.  2. Mild aortic atherosclerosis.  3. Left greater than right lung base infiltrates suggesting  pneumonia or atelectasis. Bronchial wall thickening may indicate  bronchitis or airways disease.    5:47 PM results were discussed with patient, he was informed of his new diagnoses of pneumonia, will go home on a short course of doxycycline, he denies any antibiotic use over the last couple of weeks, or months even.  He is agreeable to plan and treatment, hemodynamically stable for discharge.   Portions of this note were generated with Administrator, sports. Dictation errors may occur despite best attempts at proofreading.         Claude Manges, PA-C 02/05/23 1748    Rondel Baton, MD 02/06/23 [id number]

## 2023-02-05 NOTE — ED Notes (Signed)
Patient transported to CT 

## 2023-02-05 NOTE — ED Provider Notes (Signed)
[facility] AT [facility] Provider Note   CSN: [id number] Arrival date & time: 02/05/23  1100     History  No chief complaint on file.   Derrick Walker is a 67 y.o. male.  With a history of hypertension, type 2 diabetes, prostate cancer who presents to the ED for evaluation of left-sided rib pain.  He states that he has had no a persistent cough since September of last year.  He believes this is secondary to his final COVID-vaccine.  He states his initial series was with [employer] and his final vaccine was [employer].  He has been seen by his primary care provider multiple times for this.  He states the pain got significantly worse 3 weeks ago.  States he was performing abdominal crunch when he felt a sudden increase in pain to the left anterior ribs.  He denies any shortness of breath with this pain.  No specific chest pain.  He does not take any ACE inhibitor's.  He states his cough is mostly worse after he eats.  He denies any hemoptysis or productivity of his cough.  HPI     Home Medications Prior to Admission medications   Medication Sig Start Date End Date Taking? Authorizing Provider  pantoprazole (PROTONIX) 20 MG tablet Take 1 tablet (20 mg total) by mouth daily. 02/05/23  Yes Lindsay Soulliere, Edsel Petrin, PA-C  blood glucose meter kit and supplies KIT Inject 1 each into the skin 4 (four) times daily. Dispense based on patient and insurance preference. Dx: E11.9 07/20/19   Pincus Sanes, MD  carvedilol (COREG) 3.125 MG tablet Take 3.125 mg by mouth 2 (two) times daily. 05/05/21   [provider]  cholecalciferol (VITAMIN D3) 25 MCG (1000 UNIT) tablet Take 3,000 Units by mouth daily.    [provider]  EPINEPHRINE 0.3 mg/0.3 mL IJ SOAJ injection INJECT 0.3 MLS INTO THE MUSCLE ONCE FOR 1 DOSE 10/28/19   Pincus Sanes, MD  glipiZIDE (GLUCOTROL XL) 10 MG 24 hr tablet Take 10 mg by mouth 2 (two) times daily. 05/20/21   [provider]   hydrALAZINE (APRESOLINE) 25 MG tablet Take 12.5 mg by mouth 2 (two) times daily. 05/24/21   [provider]  latanoprost (XALATAN) 0.005 % ophthalmic solution Place 1 drop into both eyes at bedtime. 09/08/19   [provider]  metFORMIN (GLUCOPHAGE) 1000 MG tablet Take 1,000 mg by mouth 2 (two) times daily with a meal. 05/30/21   [provider]  olmesartan (BENICAR) 40 MG tablet Take 40 mg by mouth daily. 05/05/21   [provider]  ondansetron (ZOFRAN) 4 MG tablet Take 1 tablet (4 mg total) by mouth every 8 (eight) hours as needed for nausea or vomiting. 08/02/21   Venita Lick, MD  [facility] ULTRA test strip USE 1 STRIP TO CHECK GLUCOSE 4 TIMES DAILY 03/24/21   [provider]  rosuvastatin (CRESTOR) 20 MG tablet Take 20 mg by mouth daily.    [provider]      Allergies    Amlodipine, Shellfish allergy, Oxycodone, Lisinopril, and Losartan    Review of Systems   Review of Systems  Cardiovascular:  Positive for chest pain.  All other systems reviewed and are negative.   Physical Exam Updated Vital Signs BP (!) 182/88 (BP Location: Right Arm)   Pulse 80   Temp 98.7 F (37.1 C) (Oral)   Resp 16   Ht 5\' 11"  (1.803 m)   Wt 108  kg   SpO2 100%   BMI 33.19 kg/m  Physical Exam  ED Results / Procedures / Treatments   Labs (all labs ordered are listed, but only abnormal results are displayed) Labs Reviewed  BASIC METABOLIC PANEL - Abnormal; Notable for the following components:      Result Value   Glucose, Bld 268 (*)    Calcium 8.7 (*)    All other components within normal limits  CBC  TROPONIN I (HIGH SENSITIVITY)    EKG None  Radiology DG Ribs Unilateral W/Chest Left  Result Date: 02/05/2023 CLINICAL DATA:  rib pain, cough EXAM: LEFT RIBS AND CHEST - 3+ VIEW COMPARISON:  05/29/2021. FINDINGS: No fracture or other bone lesions are seen involving the ribs. There is no evidence of pneumothorax. There are atelectatic  changes at the left lung base and associated small left pleural effusion. Both lungs are otherwise clear. Right lateral costophrenic angle is clear. Heart size and mediastinal contours are within normal limits. Probable right lower paratracheal calcified lymph nodes noted. IMPRESSION: 1. No rib fracture or pneumothorax. 2. Small left pleural effusion with left basilar atelectasis. Electronically Signed   By: Jules Schick M.D.   On: 02/05/2023 13:17    Procedures Procedures    Medications Ordered in ED Medications  lidocaine (LIDODERM) 5 % 1 patch (1 patch Transdermal Patch Applied 02/05/23 1428)  methocarbamol (ROBAXIN) tablet 500 mg (has no administration in time range)    ED Course/ Medical Decision Making/ A&P                             Medical Decision Making Risk Prescription drug management.  This patient presents to the ED for concern of left rib pain, cough, this involves an extensive number of treatment options, and is a complaint that carries with it a high risk of complications and morbidity.  Differential diagnosis for emergent cause of cough includes but is not limited to upper respiratory infection, lower respiratory infection, allergies, asthma, irritants, foreign body, medications such as ACE inhibitors, reflux, asthma, CHF, lung cancer, interstitial lung disease, psychiatric causes, postnasal drip and postinfectious bronchospasm.   Co morbidities that complicate the patient evaluation  hypertension, type 2 diabetes, prostate cancer  My initial workup includes labs, imaging, EKG  Additional history obtained from: Nursing notes from this visit. Previous records within EMR system PCP patient visit for similar on 11/06/2022.  CT PE study was ordered and was negative.   I ordered, reviewed and interpreted labs which include: CBC, BMP, troponin.  Hyperglycemia of 268.  Labs otherwise normal  I ordered imaging studies including x-ray left ribs with chest, CT chest I  independently visualized and interpreted imaging which showed negative I agree with the radiologist interpretation  Cardiac Monitoring:  The patient was maintained on a cardiac monitor.  I personally viewed and interpreted the cardiac monitored which showed an underlying rhythm of: NSR  Afebrile, hypertensive but otherwise hemodynamically stable.  67 year old male presenting to the ED for evaluation of left-sided rib pain.  He believes this is secondary to the cough that he has had since September of last year.  He specifically noticed the pain getting worse 3 weeks ago when he was doing an abdominal exercise.  He reports his cough is typically worse after eating.  He reports mild pain at this time.  No specific chest pain or shortness of breath.  He had a CT PE study for the symptoms by his  primary care provider which was negative.  Presents today because he has persistent pain.  He appears very well on physical exam.  There is point tenderness to the left fifth rib.  No overlying skin changes.  Overall I am suspicious for musculoskeletal injury at this time.  Initial troponin negative.  EKG unchanged from previous.  Rib x-ray negative.  Very low suspicion for ACS or PE at this time.  Patient was treated with a topical lidocaine patch in the ED.  I do suspect his chronic cough is secondary to GERD as it is worse after eating.  Patient will be started on Protonix for this.  He was encouraged to follow-up with his primary care provider in 1 week for reevaluation. CT chest ordered due to pleural effusion and continued pain. Care will be handed off to oncoming provider pending CT chest. If negative for acute abnormalities, patient will be safe for discharge home. Plan may change at the discretion of the oncoming provider.Plan may change at the discretion of the oncoming provider.   At this time there does not appear to be any evidence of an acute emergency medical condition and the patient appears stable for  discharge with appropriate outpatient follow up. Diagnosis was discussed with patient who verbalizes understanding of care plan and is agreeable to discharge. I have discussed return precautions with patient who verbalizes understanding. Patient encouraged to follow-up with their PCP within 1 week. All questions answered.  Patient's case discussed with Dr. Jeraldine Loots who agrees with plan to discharge with follow-up.   Note: Portions of this report may have been transcribed using voice recognition software. Every effort was made to ensure accuracy; however, inadvertent computerized transcription errors may still be present.        Final Clinical Impression(s) / ED Diagnoses Final diagnoses:  Rib pain  Chronic cough    Rx / DC Orders ED Discharge Orders          Ordered    pantoprazole (PROTONIX) 20 MG tablet  Daily        02/05/23 1527              Michelle Piper, Cordelia Poche 02/05/23 1534    Gerhard Munch, MD 02/05/23 [phone]

## 2023-02-05 NOTE — ED Notes (Signed)
Dc instructions and scripts reviewed with pt no questions or concerns at this time. Will follow up with pcp.  

## 2023-02-05 NOTE — Discharge Instructions (Addendum)
I have prescribed antibiotics in order to help treat your pneumonia, please take 1 tablet twice a day for the next 7 days until completion.  You may benefit from a repeat chest x-ray after you complete the antibiotics.  You have been seen today for your complaint of left rib pain, cough. Your lab work was reassuring. Your imaging was reassuring. Your discharge medications include topical NSAIDs such as Aspercreme or Salonpas.  You should try to find the topical NSAID that also includes lidocaine.  If you cannot, you may use the topical pain medicine and a lidocaine patch. Protonix.  This is a medicine used to hopefully help with your cough.  Take it once daily until you are able to follow-up with your primary care provider Follow up with: Your PCP within the next week Please seek immediate medical care if you develop any of the following symptoms: You feel sick to your stomach (nauseous) or you throw up (vomit). You feel sweaty or light-headed. You have a cough with mucus from your lungs (sputum) or you cough up blood. You are short of breath. At this time there does not appear to be the presence of an emergent medical condition, however there is always the potential for conditions to change. Please read and follow the below instructions.  Do not take your medicine if  develop an itchy rash, swelling in your mouth or lips, or difficulty breathing; call 911 and seek immediate emergency medical attention if this occurs.  You may review your lab tests and imaging results in their entirety on your MyChart account.  Please discuss all results of fully with your primary care provider and other specialist at your follow-up visit.  Note: Portions of this text may have been transcribed using voice recognition software. Every effort was made to ensure accuracy; however, inadvertent computerized transcription errors may still be present.

## 2023-02-05 NOTE — ED Triage Notes (Addendum)
Pt c/o cough since covid shot last September; having cough since then; endorses  pain with cough and stretching under L rib x 3 weeks, some sob; denies known new injury; hypertensive in triage, endorses compliance with meds

## 2024-03-09 ENCOUNTER — Ambulatory Visit: Admitting: Cardiology

## 2024-05-26 ENCOUNTER — Ambulatory Visit: Admitting: Cardiology

## 2024-07-28 ENCOUNTER — Ambulatory Visit: Admitting: Cardiology

## 2024-09-29 ENCOUNTER — Ambulatory Visit: Admitting: Cardiology
